# Patient Record
Sex: Female | Born: 1965 | Race: White | Hispanic: No | Marital: Single | Smoking: Never smoker
Health system: Southern US, Community
[De-identification: ages and names within clinical notes are randomized; demographics above are authoritative.]

## PROBLEM LIST (undated history)

## (undated) DIAGNOSIS — G809 Cerebral palsy, unspecified: Secondary | ICD-10-CM

## (undated) DIAGNOSIS — F79 Unspecified intellectual disabilities: Secondary | ICD-10-CM

## (undated) DIAGNOSIS — M419 Scoliosis, unspecified: Secondary | ICD-10-CM

## (undated) DIAGNOSIS — G40909 Epilepsy, unspecified, not intractable, without status epilepticus: Secondary | ICD-10-CM

## (undated) DIAGNOSIS — L709 Acne, unspecified: Secondary | ICD-10-CM

## (undated) DIAGNOSIS — N200 Calculus of kidney: Secondary | ICD-10-CM

## (undated) DIAGNOSIS — H5210 Myopia, unspecified eye: Secondary | ICD-10-CM

## (undated) HISTORY — DX: Scoliosis, unspecified: M41.9

## (undated) HISTORY — DX: Unspecified intellectual disabilities: F79

## (undated) HISTORY — DX: Acne, unspecified: L70.9

## (undated) HISTORY — DX: Epilepsy, unspecified, not intractable, without status epilepticus: G40.909

## (undated) HISTORY — DX: Calculus of kidney: N20.0

## (undated) HISTORY — DX: Cerebral palsy, unspecified: G80.9

## (undated) HISTORY — DX: Myopia, unspecified eye: H52.10

## (undated) HISTORY — PX: REVISION TOTAL KNEE ARTHROPLASTY: SHX767

## (undated) HISTORY — PX: PARTIAL HIP ARTHROPLASTY: SHX733

---

## 2004-09-05 ENCOUNTER — Emergency Department: Payer: Self-pay | Admitting: Emergency Medicine

## 2009-08-19 ENCOUNTER — Emergency Department: Payer: Self-pay | Admitting: Emergency Medicine

## 2010-07-09 ENCOUNTER — Ambulatory Visit: Payer: Self-pay | Admitting: Otolaryngology

## 2011-03-30 ENCOUNTER — Emergency Department: Payer: Self-pay | Admitting: Emergency Medicine

## 2011-03-30 LAB — BASIC METABOLIC PANEL
Anion Gap: 12 (ref 7–16)
BUN: 14 mg/dL (ref 7–18)
Calcium, Total: 8.5 mg/dL (ref 8.5–10.1)
Chloride: 104 mmol/L (ref 98–107)
Co2: 28 mmol/L (ref 21–32)
Glucose: 91 mg/dL (ref 65–99)
Osmolality: 287 (ref 275–301)

## 2011-03-30 LAB — CBC
HCT: 39.7 % (ref 35.0–47.0)
HGB: 13.4 g/dL (ref 12.0–16.0)
MCHC: 33.7 g/dL (ref 32.0–36.0)
MCV: 97 fL (ref 80–100)
RBC: 4.11 10*6/uL (ref 3.80–5.20)
RDW: 13.1 % (ref 11.5–14.5)
WBC: 7.2 10*3/uL (ref 3.6–11.0)

## 2011-03-30 LAB — ACETAMINOPHEN LEVEL: Acetaminophen: <2 ug/mL

## 2011-03-30 LAB — VALPROIC ACID LEVEL: Valproic Acid: 86 ug/mL

## 2015-02-15 ENCOUNTER — Other Ambulatory Visit: Payer: Self-pay | Admitting: Internal Medicine

## 2015-02-15 DIAGNOSIS — R7989 Other specified abnormal findings of blood chemistry: Secondary | ICD-10-CM

## 2015-02-15 DIAGNOSIS — R945 Abnormal results of liver function studies: Secondary | ICD-10-CM

## 2015-02-20 ENCOUNTER — Ambulatory Visit
Admission: RE | Admit: 2015-02-20 | Discharge: 2015-02-20 | Disposition: A | Discharge status: Home / Self Care | Payer: Medicare Other | Source: Ambulatory Visit | Attending: Internal Medicine | Admitting: Internal Medicine

## 2015-02-20 DIAGNOSIS — J90 Pleural effusion, not elsewhere classified: Secondary | ICD-10-CM | POA: Diagnosis not present

## 2015-02-20 DIAGNOSIS — R945 Abnormal results of liver function studies: Secondary | ICD-10-CM

## 2015-02-20 DIAGNOSIS — R188 Other ascites: Secondary | ICD-10-CM | POA: Insufficient documentation

## 2015-02-20 DIAGNOSIS — R7989 Other specified abnormal findings of blood chemistry: Secondary | ICD-10-CM | POA: Insufficient documentation

## 2015-02-21 ENCOUNTER — Encounter: Payer: Self-pay | Admitting: Obstetrics and Gynecology

## 2015-02-21 ENCOUNTER — Ambulatory Visit (INDEPENDENT_AMBULATORY_CARE_PROVIDER_SITE_OTHER): Payer: Medicare Other | Admitting: Obstetrics and Gynecology

## 2015-02-21 VITALS — BP 109/69 | HR 125 | Wt 77.0 lb

## 2015-02-21 DIAGNOSIS — R7989 Other specified abnormal findings of blood chemistry: Secondary | ICD-10-CM | POA: Diagnosis not present

## 2015-02-21 DIAGNOSIS — G40909 Epilepsy, unspecified, not intractable, without status epilepticus: Secondary | ICD-10-CM | POA: Insufficient documentation

## 2015-02-21 DIAGNOSIS — R945 Abnormal results of liver function studies: Secondary | ICD-10-CM | POA: Insufficient documentation

## 2015-02-21 DIAGNOSIS — F79 Unspecified intellectual disabilities: Secondary | ICD-10-CM | POA: Diagnosis not present

## 2015-02-21 DIAGNOSIS — Z0000 Encounter for general adult medical examination without abnormal findings: Secondary | ICD-10-CM

## 2015-02-21 DIAGNOSIS — Z780 Asymptomatic menopausal state: Secondary | ICD-10-CM

## 2015-02-21 DIAGNOSIS — Z01419 Encounter for gynecological examination (general) (routine) without abnormal findings: Secondary | ICD-10-CM

## 2015-02-21 NOTE — Patient Instructions (Signed)
1.  Yearly mammogram is scheduled. 2.  Return in 1 year for gynecologic evaluation or as needed If vulvar/vaginal symptoms develop.

## 2015-02-21 NOTE — Progress Notes (Signed)
GYN ENCOUNTER NOTE  Subjective:       [name] is a 50 y.o. G0P0000 female is here for gynecologic evaluation of the following issues:  1.Annual GYN physical  The patient is a 50 year old white female with severe mental retardation and seizure disorder,On no hormonal medications, having no vaginal bleeding or discharge, presents from the [facility], Center for gynecologic evaluation. Patient has chronic incontinence for which she wears diapers.  No hygiene issues have been identified.  Regarding her perineum. Recent blood work done by Dr. Dareen Piano is notable for abnormal liver function tests; abdominal ultrasound has been completed (results unavailable at this time). 2 years ago.  The patient was started on aspirin because of her immobility..     Gynecologic History No LMP recorded. Patient is postmenopausal. Contraception: none Last mammogram:2015-BI-RADS 1  Obstetric History OB History  Gravida Para Term Preterm AB SAB TAB Ectopic Multiple Living         Past Medical History  Diagnosis Date  . Myopia   . Acne   . Cerebral palsy (HCC)   . Scoliosis   . Mental retardation   . Seizure disorder [facility])     Past Surgical History  Procedure Laterality Date  . Partial hip arthroplasty    . Revision total knee arthroplasty      Current Outpatient Prescriptions on File Prior to Visit  Medication Sig Dispense Refill  . aspirin 325 MG EC tablet Take 325 mg by mouth daily.    . benztropine (COGENTIN) 0.5 MG tablet Take 0.5 mg by mouth 2 (two) times daily.    . Control Gel Formula Dressing (DUODERM SIGNAL DRESSING EX) Apply topically.    Marland Kitchen doxycycline (VIBRA-TABS) 100 MG tablet Take 100 mg by mouth 2 (two) times daily.    . furosemide (LASIX) 20 MG tablet Take 20 mg by mouth.    . levothyroxine (SYNTHROID, LEVOTHROID) 100 MCG tablet Take 100 mcg by mouth daily before breakfast.    . Multiple Vitamins-Minerals (MULTIVITAMIN WITH MINERALS) tablet Take 1  tablet by mouth daily.    . Skin Protectants, Misc. (EUCERIN) cream Apply topically as needed for dry skin.    Marland Kitchen ziprasidone (GEODON) 80 MG capsule Take 80 mg by mouth 2 (two) times daily with a meal.     No current facility-administered medications on file prior to visit.    Allergies no known allergies  Social History   Social History  . Marital Status: Single    Spouse Name: N/A  . Number of Children: N/A  . Years of Education: N/A   Occupational History  . Not on file.   Social History Main Topics  . Smoking status: Never Smoker   . Smokeless tobacco: Not on file  . Alcohol Use: No  . Drug Use: No  . Sexual Activity: Not Currently   Other Topics Concern  . Not on file   Social History Narrative    Family History  Problem Relation Age of Onset  . Family history unknown: Yes    The following portions of the patient's history were reviewed and updated as appropriate: allergies, current medications, past family history, past medical history, past social history, past surgical history and problem list.  Review of Systems Unobtainable Chronic incontinence No abnormal uterine bleedingAbdomen  Objective:   BP 109/69 mmHg  Pulse 125  Wt 77 lb (34.927 kg) Menopausal white female lying on the exam table with assistance of providers;  fetal position. Head and neck: No adenopathy or thyromegaly. Lungs: Clear Heart: Regular rate and rhythm without murmur. Abdomen: Rigid, with voluntary guarding; nontender; no hepatomegaly; no hernia. Pelvic: External genitalia-no lesions, hyperemia, or epithelial skin breakdown. BUS-normal. Vagina-mild atrophy Cervix.  I did not evaluated. Uterus-not evaluated. Bimanual exam-not done. Rectovaginal-external hemorrhoids noted; internal exam not done. Extremities: Contractures.  Extremities noted; diffuse skin lesions with epithelial skin breakdown noted from chronic scratching.  Muscle atrophy is present  ASSESSMENT: 1.  Wellness  exam, gynecologic, stable. 2.  Seizure disorder. 3.  Mental retardation.  PLAN: 1.  Yearly mammogram. 2.  Monitor for any abnormal uterine bleeding or any gynecologic symptoms. 3.  Follow-up on a yearly basis or as needed.  Herold Harms, MD  Note: This dictation was prepared with Dragon dictation along with smaller phrase technology. Any transcriptional errors that result from this process are unintentional.

## 2016-01-22 ENCOUNTER — Emergency Department
Admission: EM | Admit: 2016-01-22 | Discharge: 2016-01-22 | Disposition: A | Discharge status: Home / Self Care | Payer: Medicare Other | Attending: Emergency Medicine | Admitting: Emergency Medicine

## 2016-01-22 ENCOUNTER — Encounter: Payer: Self-pay | Admitting: Emergency Medicine

## 2016-01-22 DIAGNOSIS — L089 Local infection of the skin and subcutaneous tissue, unspecified: Secondary | ICD-10-CM | POA: Insufficient documentation

## 2016-01-22 DIAGNOSIS — Z7982 Long term (current) use of aspirin: Secondary | ICD-10-CM | POA: Insufficient documentation

## 2016-01-22 DIAGNOSIS — Z79899 Other long term (current) drug therapy: Secondary | ICD-10-CM | POA: Diagnosis not present

## 2016-01-22 DIAGNOSIS — T148XXA Other injury of unspecified body region, initial encounter: Secondary | ICD-10-CM

## 2016-01-22 MED ORDER — AMOXICILLIN-POT CLAVULANATE 875-125 MG PO TABS: 1.0000 | ORAL_TABLET | Freq: Two times a day (BID) | ORAL | 0 refills | Status: AC

## 2016-01-22 NOTE — Discharge Instructions (Signed)
Please seek medical attention for any high fevers, chest pain, shortness of breath, change in behavior, persistent vomiting, bloody stool or any other new or concerning symptoms.  

## 2016-01-22 NOTE — ED Provider Notes (Signed)
[facility] Emergency Department Provider Note  _________________________________________   I have reviewed the triage vital signs and the nursing notes.   HISTORY  Chief Complaint Wound Infection   History limited by: MR, history obtained from caregiver   HPI [name] is a 51 y.o. female who presents to the emergency department today because of concerns for wound infection. Per caregiver the patient is a long history of biting her wrists. The patient has been doing this more often in the past couple weeks. The patient has cause infections in the past. They have not noticed any fevers. No nausea or vomiting. They have tried all manners of protecting the wrist which the patient does not tolerate and/or find some way to get around.   Past Medical History:  Diagnosis Date  . Acne   . Cerebral palsy (HCC)   . Mental retardation   . Myopia   . Scoliosis   . Seizure disorder [facility])     Patient Active Problem List   Diagnosis Date Noted  . Mental retardation 02/21/2015  . Seizure disorder, primary (HCC) 02/21/2015  . Menopause 02/21/2015  . Abnormal liver function tests 02/21/2015    Past Surgical History:  Procedure Laterality Date  . PARTIAL HIP ARTHROPLASTY    . REVISION TOTAL KNEE ARTHROPLASTY      Prior to Admission medications   Medication Sig Start Date End Date Taking? Authorizing Provider  aspirin 325 MG EC tablet Take 325 mg by mouth daily.    Historical Provider, MD  benztropine (COGENTIN) 0.5 MG tablet Take 0.5 mg by mouth 2 (two) times daily.    Historical Provider, MD  carbamazepine (TEGRETOL) 100 MG chewable tablet Chew by mouth 2 (two) times daily.    Historical Provider, MD  Control Gel Formula Dressing (DUODERM SIGNAL DRESSING EX) Apply topically.    Historical Provider, MD  doxycycline (VIBRA-TABS) 100 MG tablet Take 100 mg by mouth 2 (two) times daily.    Historical Provider, MD  furosemide (LASIX) 20 MG tablet Take 20 mg  by mouth.    Historical Provider, MD  levothyroxine (SYNTHROID, LEVOTHROID) 100 MCG tablet Take 100 mcg by mouth daily before breakfast.    Historical Provider, MD  Multiple Vitamins-Minerals (MULTIVITAMIN WITH MINERALS) tablet Take 1 tablet by mouth daily.    Historical Provider, MD  omeprazole (PRILOSEC) 20 MG capsule Take 20 mg by mouth daily.    Historical Provider, MD  Skin Protectants, Misc. (EUCERIN) cream Apply topically as needed for dry skin.    Historical Provider, MD  vitamin E 100 UNIT capsule Take by mouth daily.    Historical Provider, MD  ziprasidone (GEODON) 80 MG capsule Take 80 mg by mouth 2 (two) times daily with a meal.    Historical Provider, MD    Allergies Patient has no known allergies.  Family History  Problem Relation Age of Onset  . Family history unknown: Yes    Social History Social History  Substance Use Topics  . Smoking status: Never Smoker  . Smokeless tobacco: Never Used  . Alcohol use No    Review of Systems Unable to obtain given MR  ____________________________________________   PHYSICAL EXAM:  VITAL SIGNS: ED Triage Vitals  Enc Vitals Group     BP 01/22/16 1749 (!) 141/81     Pulse Rate 01/22/16 1749 (!) 103     Resp 01/22/16 1749 20     Temp 01/22/16 1749 98.7 F (37.1 C)     Temp Source  01/22/16 1749 Axillary     SpO2 01/22/16 1749 93 %     Weight 01/22/16 1750 80 lb (36.3 kg)     Height 01/22/16 1750 4\' 10"  (1.473 m)   Constitutional: Awake and alert. Non verbal. No acute distress.  Eyes: Conjunctivae are normal. Normal extraocular movements. ENT   Head: Normocephalic and atraumatic.   Nose: No congestion/rhinnorhea.   Mouth/Throat: Mucous membranes are moist.   Neck: No stridor. Hematological/Lymphatic/Immunilogical: No cervical lymphadenopathy. Cardiovascular: Normal rate, regular rhythm.  No murmurs, rubs, or gallops.  Respiratory: Normal respiratory effort without tachypnea nor retractions. Breath sounds  are clear and equal bilaterally. No wheezes/rales/rhonchi. Gastrointestinal: Soft and non tender. No rebound. No guarding.  Neurologic:  Non verbal. Noted to bite her wrists occasionally.  Skin:  Bilateral wounds to wrists. Some surrounding erythema. Mild swelling to dorsal surface of right wrist. No fluctuance to either wrist. ROM intact.   ____________________________________________    LABS (pertinent positives/negatives)  None  ____________________________________________   EKG  None  ____________________________________________    RADIOLOGY  None  ____________________________________________   PROCEDURES  Procedures  ____________________________________________   INITIAL IMPRESSION / ASSESSMENT AND PLAN / ED COURSE  Pertinent labs & imaging results that were available during my care of the patient were reviewed by me and considered in my medical decision making (see chart for details).  Patient brought to emergency department today by caregiver because of concerns for infected wounds to her bilateral wrists. These are self-inflicted from her own bites. I'll exam there is some erythema around these areas. I do think they're likely infected. No swelling or tenderness to the flexor surface. Will discharge with antibiotics. Did discuss return precautions.  ____________________________________________   FINAL CLINICAL IMPRESSION(S) / ED DIAGNOSES  Final diagnoses:  Wound infection     Note: This dictation was prepared with Dragon dictation. Any transcriptional errors that result from this process are unintentional     Phineas SemenGraydon Kourtland Coopman, MD 01/22/16 2150

## 2016-01-22 NOTE — ED Triage Notes (Signed)
Patient presents to the ED with redness and swelling to her right hand.  Patient is wheelchair bound and not very verbal at baseline.  Care taker reports that patient often bites her hands and wrists but today she noticed that area appeared swollen and red.  Patient does not appear in distress at this time.  Patient did attempt to bite registration staff.

## 2016-02-23 NOTE — Progress Notes (Signed)
GYN ENCOUNTER NOTE  Subjective:       [name] is a 51 y.o. G0P0000 female is here for gynecologic evaluation of the following issues:  1.Annual GYN physical 2. pcp tried prempro- insurance will not help  The patient is a 51 year old white female with severe mental retardation and seizure disorder,On no hormonal medications, having no vaginal bleeding or discharge, [facility]. Patient has chronic incontinence for which she wears diapers.  No hygiene issues have been identified.  Regarding her perineum. The patient was started on aspirin because of her immobility. Healthcare workers have noted the patient to be more emotionally labile recently; primary care did a trial of HRT; HRT was discontinued due to insurance non-authorization.     Gynecologic History No LMP recorded. Patient is postmenopausal. Contraception: none Last mammogram:2015-BI-RADS 1  Obstetric History OB History  Gravida Para Term Preterm AB Living  0 0 0 0 0 0  SAB TAB Ectopic Multiple Live Births  0 0 0 0          Past Medical History:  Diagnosis Date  . Acne   . Cerebral palsy (HCC)   . Mental retardation   . Myopia   . Scoliosis   . Seizure disorder [facility](HCC)     Past Surgical History:  Procedure Laterality Date  . PARTIAL HIP ARTHROPLASTY    . REVISION TOTAL KNEE ARTHROPLASTY      Current Outpatient Prescriptions on File Prior to Visit  Medication Sig Dispense Refill  . aspirin 325 MG EC tablet Take 325 mg by mouth daily.    . benztropine (COGENTIN) 0.5 MG tablet Take 0.5 mg by mouth 2 (two) times daily.    . carbamazepine (TEGRETOL) 100 MG chewable tablet Chew by mouth 2 (two) times daily.    . Control Gel Formula Dressing (DUODERM SIGNAL DRESSING EX) Apply topically.    Marland Kitchen. doxycycline (VIBRA-TABS) 100 MG tablet Take 100 mg by mouth 2 (two) times daily.    . furosemide (LASIX) 20 MG tablet Take 20 mg by mouth.    . levothyroxine  (SYNTHROID, LEVOTHROID) 100 MCG tablet Take 100 mcg by mouth daily before breakfast.    . Multiple Vitamins-Minerals (MULTIVITAMIN WITH MINERALS) tablet Take 1 tablet by mouth daily.    Marland Kitchen. omeprazole (PRILOSEC) 20 MG capsule Take 20 mg by mouth daily.    . Skin Protectants, Misc. (EUCERIN) cream Apply topically as needed for dry skin.    Marland Kitchen. vitamin E 100 UNIT capsule Take by mouth daily.    . ziprasidone (GEODON) 80 MG capsule Take 80 mg by mouth 2 (two) times daily with a meal.     No current facility-administered medications on file prior to visit.     No Known Allergies  Social History   Social History  . Marital status: Single    Spouse name: N/A  . Number of children: N/A  . Years of education: N/A   Occupational History  . Not on file.   Social History Main Topics  . Smoking status: Never Smoker  . Smokeless tobacco: Never Used  . Alcohol use No  . Drug use: No  . Sexual activity: Not Currently   Other Topics Concern  . Not on file   Social History Narrative  . No narrative on file    Family History  Problem Relation Age of Onset  . Family history unknown: Yes    The following portions of the patient's history were reviewed  and updated as appropriate: allergies, current medications, past family history, past medical history, past social history, past surgical history and problem list.  Review of Systems Unobtainable Chronic incontinence No abnormal uterine bleedingAbdomen  Objective:   BP 135/80   Pulse 69   Ht 4\' 10"  (1.473 m)   Wt 77 lb (34.9 kg)   BMI 16.09 kg/m  Menopausal white female lying on the exam table with assistance of providers; fetal position. Head and neck: No adenopathy or thyromegaly. Lungs: Clear Heart: Regular rate and rhythm without murmur. Abdomen: Rigid, with voluntary guarding; nontender; no hepatomegaly; no hernia.Bowel sounds present Pelvic: External genitalia-no lesions, hyperemia, or epithelial skin  breakdown. BUS-normal. Vagina-mild atrophy Cervix: Nulliparous; no lesions; Friable with Pap smear Uterus-Nonenlarged Bimanual palpable masses Rectovaginal-external hemorrhoids noted; sphincter tone lax; soft stool in the rectal vault without palpable rectal masses Extremities: Contractures.  Extremities noted; diffuse skin lesions with epithelial skin breakdown noted from chronic scratching.  Muscle atrophy is present  ASSESSMENT: 1.  Wellness exam, gynecologic, stable. 2.  Seizure disorder. 3.  Mental retardation.  PLAN: 1.  Yearly mammogram. 2. Pap smear is obtained 3.  Monitor for any abnormal uterine bleeding or any gynecologic symptoms. 4. Do not recommend HRT therapy in this patient due to potential increased risk for DVT in sedentary patient.  5. Follow-up on a yearly basis or as needed.  Darol Destine, CMA  Herold Harms, MD   Note: This dictation was prepared with Dragon dictation along with smaller phrase technology. Any transcriptional errors that result from this process are unintentional.

## 2016-02-27 ENCOUNTER — Encounter: Payer: Self-pay | Admitting: Obstetrics and Gynecology

## 2016-02-27 ENCOUNTER — Ambulatory Visit (INDEPENDENT_AMBULATORY_CARE_PROVIDER_SITE_OTHER): Payer: Medicare Other | Admitting: Obstetrics and Gynecology

## 2016-02-27 VITALS — BP 135/80 | HR 69 | Ht <= 58 in | Wt 77.0 lb

## 2016-02-27 DIAGNOSIS — G40909 Epilepsy, unspecified, not intractable, without status epilepticus: Secondary | ICD-10-CM

## 2016-02-27 DIAGNOSIS — Z780 Asymptomatic menopausal state: Secondary | ICD-10-CM | POA: Diagnosis not present

## 2016-02-27 DIAGNOSIS — F79 Unspecified intellectual disabilities: Secondary | ICD-10-CM | POA: Diagnosis not present

## 2016-02-27 DIAGNOSIS — Z0000 Encounter for general adult medical examination without abnormal findings: Secondary | ICD-10-CM

## 2016-02-27 DIAGNOSIS — Z01419 Encounter for gynecological examination (general) (routine) without abnormal findings: Secondary | ICD-10-CM

## 2016-02-27 NOTE — Patient Instructions (Signed)
1. Pap smear is obtained 2. Do not recommend HRT therapy 3. Screening mammogram is ordered 4. Return in 1 year for follow-up or as needed if gynecologic issues develop

## 2016-02-27 NOTE — Addendum Note (Signed)
Addended by: Marchelle FolksMILLER, Townsend Cudworth G on: 02/27/2016 04:02 PM   Modules accepted: Orders

## 2016-02-29 LAB — PAP IG AND HPV HIGH-RISK
HPV, high-risk: NEGATIVE
PAP Smear Comment: 0

## 2016-09-18 ENCOUNTER — Encounter: Payer: Medicare Other | Attending: Internal Medicine | Admitting: Internal Medicine

## 2016-09-18 DIAGNOSIS — G809 Cerebral palsy, unspecified: Secondary | ICD-10-CM | POA: Insufficient documentation

## 2016-09-18 DIAGNOSIS — G4730 Sleep apnea, unspecified: Secondary | ICD-10-CM | POA: Insufficient documentation

## 2016-09-18 DIAGNOSIS — L03113 Cellulitis of right upper limb: Secondary | ICD-10-CM | POA: Diagnosis not present

## 2016-09-18 DIAGNOSIS — X58XXXD Exposure to other specified factors, subsequent encounter: Secondary | ICD-10-CM | POA: Insufficient documentation

## 2016-09-18 DIAGNOSIS — S60571D Other superficial bite of hand of right hand, subsequent encounter: Secondary | ICD-10-CM | POA: Diagnosis not present

## 2016-09-18 DIAGNOSIS — L03114 Cellulitis of left upper limb: Secondary | ICD-10-CM | POA: Insufficient documentation

## 2016-09-18 DIAGNOSIS — L98498 Non-pressure chronic ulcer of skin of other sites with other specified severity: Secondary | ICD-10-CM | POA: Insufficient documentation

## 2016-09-18 DIAGNOSIS — S60572D Other superficial bite of hand of left hand, subsequent encounter: Secondary | ICD-10-CM | POA: Diagnosis not present

## 2016-09-18 DIAGNOSIS — F72 Severe intellectual disabilities: Secondary | ICD-10-CM | POA: Diagnosis not present

## 2016-09-19 ENCOUNTER — Ambulatory Visit: Payer: Medicare Other | Admitting: Surgery

## 2016-09-20 NOTE — Progress Notes (Signed)
AUDYN, DIMERCURIO ([id number]) Visit Report for 09/18/2016 Abuse/Suicide Risk Screen Details Patient Name: [name], [name]. Date of Service: 09/18/2016 12:45 PM Medical Record Number: [id number] Patient Account Number: [id number] Date of Birth/Sex: 1965-11-14 (50 y.o. Female) Treating RN: Huel Coventry Primary Care Ismelda Weatherman: Einar Crow Other Clinician: Referring Omarius Grantham: Debbra Riding Treating Jariya Reichow/Extender: Rudene Re in Treatment: 0 Abuse/Suicide Risk Screen Items Answer Notes Patient has mental retardation and is unable to answer. Electronic Signature(s) Signed: 09/18/2016 5:49:32 PM By: Elliot Gurney, BSN, RN, CWS, Kim RN, BSN Entered By: Elliot Gurney, BSN, RN, CWS, Kim on 09/18/2016 13:13:04 [name] ([id number]) -------------------------------------------------------------------------------- Activities of Daily Living Details Patient Name: LARCENIA, HOLADAY. Date of Service: 09/18/2016 12:45 PM Medical Record Number: [id number] Patient Account Number: [id number] Date of Birth/Sex: [birth year] (50 y.o. Female) Treating RN: Huel Coventry Primary Care Malacki Mcphearson: Einar Crow Other Clinician: Referring Walta Bellville: Debbra Riding Treating Eathel Pajak/Extender: Rudene Re in Treatment: 0 Activities of Daily Living Items Answer Activities of Daily Living (Please select one for each item) Drive Automobile Not Able Take Medications Not Able Use Telephone Not Able Care for Appearance Not Able Use Toilet Not Able Mady Haagensen / Shower Not Able Dress Self Not Able Feed Self Not Able Walk Not Able Get In / Out Bed Not Able Housework Not Able Prepare Meals Not Able Handle Money Not Able Shop for Self Not Able Electronic Signature(s) Signed: 09/18/2016 5:49:32 PM By: Elliot Gurney, BSN, RN, CWS, Kim RN, BSN Entered By: Elliot Gurney, BSN, RN, CWS, Kim on 09/18/2016 13:13:17 [name]  ([id number]) -------------------------------------------------------------------------------- Education Assessment Details Patient Name: [name]. Date of Service: 09/18/2016 12:45 PM Medical Record Number: [id number] Patient Account Number: [id number] Date of Birth/Sex: 1965/07/30 (50 y.o. Female) Treating RN: Huel Coventry Primary Care Samina Weekes: Einar Crow Other Clinician: Referring Renny Remer: Debbra Riding Treating Kadar Chance/Extender: Rudene Re in Treatment: 0 Primary Learner Assessed: Caregiver day manager Patient has mental Reason Patient is not Primary Learner: retardation and is unable to answer. Learning Preferences/Education Level/Primary Language Learning Preference: Explanation, Demonstration Preferred Language: English Cognitive Barrier Assessment/Beliefs Language Barrier: No Memory Deficit: No Emotional Barrier: No Cultural/Religious Beliefs Affecting Medical No Care: Physical Barrier Assessment Impaired Vision: No Impaired Hearing: No Decreased Hand dexterity: No Knowledge/Comprehension Assessment Knowledge Level: Low Comprehension Level: Low Ability to understand written Low instructions: Motivation Assessment Anxiety Level: Anxious Cooperation: Cooperative Interest in Health Problems: Uninterested Notes Patient has mental retardation and is unable to answer. Electronic Signature(s) Signed: 09/18/2016 5:49:32 PM By: Elliot Gurney, BSN, RN, CWS, Kim RN, BSN Entered By: Elliot Gurney, BSN, RN, CWS, Kim on 09/18/2016 13:14:03 SHARINE, CADLE ([id number]) -------------------------------------------------------------------------------- Fall Risk Assessment Details Patient Name: [name]. Date of Service: 09/18/2016 12:45 PM Medical Record Number: [id number] Patient Account Number: [id number] Date of Birth/Sex: 02-21-1965 (50 y.o. Female) Treating RN: Huel Coventry Primary Care Bowie Doiron: Einar Crow Other Clinician: Referring Takeela Peil:  Debbra Riding Treating Lysha Schrade/Extender: Rudene Re in Treatment: 0 Fall Risk Assessment Items Have you had 2 or more falls in the last 12 monthso 0 No Have you had any fall that resulted in injury in the last 12 monthso 0 No FALL RISK ASSESSMENT: History of falling - immediate or within 3 months 25 Yes Secondary diagnosis 0 No Ambulatory aid None/bed rest/wheelchair/nurse 0 Yes Crutches/cane/walker 0 No Furniture 0 No IV Access/Saline Lock 0 No Gait/Training Normal/bed rest/immobile 0 Yes Weak 0 No Impaired 0 No Mental Status Oriented to own ability 0 No Notes Patient has mental retardation and is nonverbal. Electronic Signature(s) Signed: 09/18/2016 5:49:32  PM By: Elliot GurneyWoody, BSN, RN, CWS, Kim RN, BSN Entered By: Elliot GurneyWoody, BSN, RN, CWS, Kim on 09/18/2016 13:14:36 Danae OrleansBYRUM, Romilda K. ([id number]) -------------------------------------------------------------------------------- Foot Assessment Details Patient Name: Danae OrleansBYRUM, Anaih K. Date of Service: 09/18/2016 12:45 PM Medical Record Number: [id number] Patient Account Number: [id number] Date of Birth/Sex: 10-26-1965 (50 y.o. Female) Treating RN: Huel CoventryWoody, Kim Primary Care Evelynne Spiers: Einar CrowAnderson, Marshall Other Clinician: Referring Abran Gavigan: Debbra RidingWHITAKER, JASON Treating Kalyani Maeda/Extender: Rudene ReBritto, Errol Weeks in Treatment: 0 Foot Assessment Items [x]  Unable to perform due to altered mental status Site Locations + = Sensation present, - = Sensation absent, C = Callus, U = Ulcer R = Redness, W = Warmth, M = Maceration, PU = Pre-ulcerative lesion F = Fissure, S = Swelling, D = Dryness Assessment Right: Left: Other Deformity: No No Prior Foot Ulcer: No No Prior Amputation: No No Charcot Joint: No No Ambulatory Status: Gait: Electronic Signature(s) Signed: 09/18/2016 5:49:32 PM By: Elliot GurneyWoody, BSN, RN, CWS, Kim RN, BSN Entered By: Elliot GurneyWoody, BSN, RN, CWS, Kim on 09/18/2016 13:15:06 Danae OrleansBYRUM, Chastity K.  ([id number]) -------------------------------------------------------------------------------- Nutrition Risk Assessment Details Patient Name: Danae OrleansBYRUM, Brianni K. Date of Service: 09/18/2016 12:45 PM Medical Record Number: [id number] Patient Account Number: [id number] Date of Birth/Sex: 10-26-1965 (50 y.o. Female) Treating RN: Huel CoventryWoody, Kim Primary Care Quentez Lober: Einar CrowAnderson, Marshall Other Clinician: Referring Oumou Smead: Debbra RidingWHITAKER, JASON Treating Shaquanda Graves/Extender: Rudene ReBritto, Errol Weeks in Treatment: 0 Height (in): Weight (lbs): Body Mass Index (BMI): Nutrition Risk Assessment Items NUTRITION RISK SCREEN: I have an illness or condition that made me change the kind and/or 0 No amount of food I eat I eat fewer than two meals per day 0 No I eat few fruits and vegetables, or milk products 0 No I have three or more drinks of beer, liquor or wine almost every day 0 No I have tooth or mouth problems that make it hard for me to eat 0 No I don't always have enough money to buy the food I need 0 No I eat alone most of the time 0 No I take three or more different prescribed or over-the-counter drugs a 1 Yes day Without wanting to, I have lost or gained 10 pounds in the last six 0 No months I am not always physically able to shop, cook and/or feed myself 0 No Nutrition Protocols Good Risk Protocol 0 No interventions needed Moderate Risk Protocol Electronic Signature(s) Signed: 09/18/2016 5:49:32 PM By: Elliot GurneyWoody, BSN, RN, CWS, Kim RN, BSN Entered By: Elliot GurneyWoody, BSN, RN, CWS, Kim on 09/18/2016 13:14:53

## 2016-09-20 NOTE — Progress Notes (Signed)
[name], [name] ([id number]) Visit Report for 09/18/2016 Allergy List Details Patient Name: [name], [name]. Date of Service: 09/18/2016 12:45 PM Medical Record Number: [id number] Patient Account Number: [id number] Date of Birth/Sex: 12/01/1965 (51 y.o. Female) Treating RN: Huel Coventry Primary Care Edona Schreffler: Einar Crow Other Clinician: Referring Conard Alvira: Debbra Riding Treating Elara Cocke/Extender: Rudene Re in Treatment: 0 Allergies Active Allergies No Known Drug Allergies Allergy Notes Electronic Signature(s) Signed: 09/18/2016 5:49:32 PM By: Elliot Gurney, BSN, RN, CWS, Kim RN, BSN Entered By: Elliot Gurney, BSN, RN, CWS, Kim on 09/18/2016 12:59:38 [name] ([id number]) -------------------------------------------------------------------------------- Arrival Information Details Patient Name: [name]. Date of Service: 09/18/2016 12:45 PM Medical Record Number: [id number] Patient Account Number: [id number] Date of Birth/Sex: 1965-10-22 (51 y.o. Female) Treating RN: Huel Coventry Primary Care Antoino Westhoff: Einar Crow Other Clinician: Referring Windsor Goeken: Debbra Riding Treating Lleyton Byers/Extender: Rudene Re in Treatment: 0 Visit Information Patient Arrived: Wheel Chair Arrival Time: 12:55 Accompanied By: Neldon Labella, day manager Transfer Assistance: None Patient Identification Verified: Yes Secondary Verification Yes Process Completed: Patient Has Alerts: Yes Notes Left patient in her chair. Electronic Signature(s) Signed: 09/18/2016 5:49:32 PM By: Elliot Gurney, BSN, RN, CWS, Kim RN, BSN Entered By: Elliot Gurney, BSN, RN, CWS, Kim on 09/18/2016 12:55:55 [name] ([id number]) -------------------------------------------------------------------------------- Clinic Level of Care Assessment Details Patient Name: [name], [name]. Date of Service: 09/18/2016 12:45 PM Medical Record Patient Account Number: [id number] Number: Treating  RN: Huel Coventry 11-02-1965 (51 y.o. Other Clinician: Date of Birth/Sex: Female) Treating ROBSON, MICHAEL Primary Care Morenike Cuff: Einar Crow Zay Yeargan/Extender: G Referring Dakayla Disanti: Driscilla Grammes in Treatment: 0 Clinic Level of Care Assessment Items TOOL 2 Quantity Score []  - Use when only an EandM is performed on the INITIAL visit 0 ASSESSMENTS - Nursing Assessment / Reassessment X - General Physical Exam (combine w/ comprehensive assessment (listed just 1 20 below) when performed on new pt. evals) X - Comprehensive Assessment (HX, ROS, Risk Assessments, Wounds Hx, etc.) 1 25 ASSESSMENTS - Wound and Skin Assessment / Reassessment []  - Simple Wound Assessment / Reassessment - one wound 0 X - Complex Wound Assessment / Reassessment - multiple wounds 1 5 []  - Dermatologic / Skin Assessment (not related to wound area) 0 ASSESSMENTS - Ostomy and/or Continence Assessment and Care []  - Incontinence Assessment and Management 0 []  - Ostomy Care Assessment and Management (repouching, etc.) 0 PROCESS - Coordination of Care []  - Simple Patient / Family Education for ongoing care 0 X - Complex (extensive) Patient / Family Education for ongoing care 1 20 X - Staff obtains Chiropractor, Records, Test Results / Process Orders 1 10 []  - Staff telephones HHA, Nursing Homes / Clarify orders / etc 0 []  - Routine Transfer to another Facility (non-emergent condition) 0 []  - Routine Hospital Admission (non-emergent condition) 0 X - New Admissions / Manufacturing engineer / Ordering NPWT, Apligraf, etc. 1 15 []  - Emergency Hospital Admission (emergent condition) 0 [name], [name] ([id number]) X - Simple Discharge Coordination 1 10 []  - Complex (extensive) Discharge Coordination 0 PROCESS - Special Needs []  - Pediatric / Minor Patient Management 0 []  - Isolation Patient Management 0 []  - Hearing / Language / Visual special needs 0 []  - Assessment of Community assistance (transportation,  D/C planning, etc.) 0 []  - Additional assistance / Altered mentation 0 []  - Support Surface(s) Assessment (bed, cushion, seat, etc.) 0 INTERVENTIONS - Wound Cleansing / Measurement X - Wound Imaging (photographs - any number of wounds) 1 5 []  - Wound Tracing (instead of photographs)  0 []  - Simple Wound Measurement - one wound 0 X - Complex Wound Measurement - multiple wounds 3 5 []  - Simple Wound Cleansing - one wound 0 X - Complex Wound Cleansing - multiple wounds 3 5 INTERVENTIONS - Wound Dressings []  - Small Wound Dressing one or multiple wounds 0 X - Medium Wound Dressing one or multiple wounds 2 15 []  - Large Wound Dressing one or multiple wounds 0 []  - Application of Medications - injection 0 INTERVENTIONS - Miscellaneous []  - External ear exam 0 []  - Specimen Collection (cultures, biopsies, blood, body fluids, etc.) 0 []  - Specimen(s) / Culture(s) sent or taken to Lab for analysis 0 []  - Patient Transfer (multiple staff / Nurse, adult / Similar devices) 0 []  - Simple Staple / Suture removal (25 or less) 0 [name], [name] ([id number]) []  - Complex Staple / Suture removal (26 or more) 0 []  - Hypo / Hyperglycemic Management (close monitor of Blood Glucose) 0 []  - Ankle / Brachial Index (ABI) - do not check if billed separately 0 Has the patient been seen at the hospital within the last three years: Yes Total Score: 170 Level Of Care: New/Established - Level 5 Electronic Signature(s) Signed: 09/18/2016 5:49:32 PM By: Elliot Gurney, BSN, RN, CWS, Kim RN, BSN Entered By: Elliot Gurney, BSN, RN, CWS, Kim on 09/18/2016 13:49:10 [name] ([id number]) -------------------------------------------------------------------------------- Encounter Discharge Information Details Patient Name: [name]. Date of Service: 09/18/2016 12:45 PM Medical Record Patient Account Number: [id number] Number: Treating RN: Huel Coventry Oct 15, 1965 (51 y.o. Other Clinician: Date of  Birth/Sex: Female) Treating ROBSON, MICHAEL Primary Care Sumaiya Arruda: Einar Crow Emiya Loomer/Extender: G Referring Murline Weigel: Driscilla Grammes in Treatment: 0 Encounter Discharge Information Items Discharge Condition: Stable Ambulatory Status: Wheelchair Discharge Destination: Nursing Home Transportation: Private Auto Accompanied By: caregiver Schedule Follow-up Appointment: Yes Medication Reconciliation completed and provided to Patient/Care Yes Fedrick Cefalu: Provided on Clinical Summary of Care: 09/18/2016 Form Type Recipient Paper Patient MB Electronic Signature(s) Signed: 09/19/2016 10:47:24 AM By: Gwenlyn Perking Entered By: Gwenlyn Perking on 09/18/2016 13:52:29 [name] ([id number]) -------------------------------------------------------------------------------- Lower Extremity Assessment Details Patient Name: [name]. Date of Service: 09/18/2016 12:45 PM Medical Record Number: [id number] Patient Account Number: [id number] Date of Birth/Sex: Jul 16, 1965 (51 y.o. Female) Treating RN: Huel Coventry Primary Care Nehal Shives: Einar Crow Other Clinician: Referring Keir Foland: Debbra Riding Treating Hosie Sharman/Extender: Rudene Re in Treatment: 0 Electronic Signature(s) Signed: 09/18/2016 5:49:32 PM By: Elliot Gurney, BSN, RN, CWS, Kim RN, BSN Entered By: Elliot Gurney, BSN, RN, CWS, Kim on 09/18/2016 13:15:20 [name] ([id number]) -------------------------------------------------------------------------------- Multi Wound Chart Details Patient Name: [name]. Date of Service: 09/18/2016 12:45 PM Medical Record Patient Account Number: [id number] Number: Treating RN: Huel Coventry Mar 20, 1965 (50 y.o. Other Clinician: Date of Birth/Sex: Female) Treating ROBSON, MICHAEL Primary Care Alyxandria Wentz: Einar Crow Charleton Deyoung/Extender: G Referring Finneus Kaneshiro: Driscilla Grammes in Treatment: 0 Vital Signs Height(in): Pulse(bpm):  106 Weight(lbs): Blood Pressure 118/76 (mmHg): Body Mass Index(BMI): Temperature(F): 97.6 Respiratory Rate 16 (breaths/min): Photos: [1:No Photos] [2:No Photos] [3:No Photos] Wound Location: [1:Right Hand - Web Between 1st and 2nd Digit] [2:Left Hand - Web Between 1st and 2nd Digit] [3:Left Hand - Dorsum] Wounding Event: [1:Trauma] [2:Trauma] [3:Trauma] Primary Etiology: [1:Trauma, Other] [2:Trauma, Other] [3:Trauma, Other] Comorbid History: [1:Sleep Apnea, Quadriplegia] [2:Sleep Apnea, Quadriplegia] [3:Sleep Apnea, Quadriplegia] Date Acquired: [1:08/26/2016] [2:08/26/2016] [3:08/26/2016] Weeks of Treatment: [1:0] [2:0] [3:0] Wound Status: [1:Open] [2:Open] [3:Open] Measurements L x W x D 2x0.8x0.1 [2:1.5x1x0.1] [3:2.2x0.5x0.1] (cm) Area (cm) : [1:1.257] [2:1.178] [3:0.864] Volume (  cm) : [1:0.126] [2:0.118] [3:0.086] % Reduction in Area: [1:0.00%] [2:N/A] [3:N/A] % Reduction in Volume: 0.00% [2:N/A] [3:N/A] Classification: [1:Partial Thickness] [2:Partial Thickness] [3:Partial Thickness] Exudate Amount: [1:N/A] [2:N/A] [3:None Present] Wound Margin: [1:Flat and Intact] [2:Flat and Intact] [3:Flat and Intact] Exposed Structures: [1:Fascia: No Fat Layer (Subcutaneous Tissue) Exposed: No Tendon: No Muscle: No Joint: No Bone: No] [2:N/A] [3:N/A] Epithelialization: [1:None] [2:N/A] [3:None] Periwound Skin Texture: No Abnormalities Noted [2:No Abnormalities Noted] [3:No Abnormalities Noted] Periwound Skin No Abnormalities Noted No Abnormalities Noted No Abnormalities Noted Moisture: Periwound Skin Color: No Abnormalities Noted No Abnormalities Noted No Abnormalities Noted Tenderness on No No No Palpation: Assessment Notes: wounds are hard to N/A N/A assess. patient is easily agitated. Treatment Notes Electronic Signature(s) Signed: 09/18/2016 5:01:41 PM By: Baltazar Najjarobson, Michael MD Entered By: Baltazar Najjarobson, Michael on 09/18/2016 13:41:52 [name], [name]  ([id number]) -------------------------------------------------------------------------------- Pain Assessment Details Patient Name: [name], [name] Date of Service: 09/18/2016 12:45 PM Medical Record Number: [id number] Patient Account Number: [id number] Date of Birth/Sex: 1965-11-21 (51 y.o. Female) Treating RN: Huel CoventryWoody, Kim Primary Care Nikai Quest: Einar CrowAnderson, Marshall Other Clinician: Referring Malachai Schalk: Debbra RidingWHITAKER, JASON Treating Johnpatrick Jenny/Extender: Rudene ReBritto, Errol Weeks in Treatment: 0 Active Problems Location of Pain Severity and Description of Pain Patient Has Paino Patient Unable to Respond Site Locations Pain Management and Medication Current Pain Management: Goals for Pain Management Topical or injectable lidocaine is offered to patient for acute pain when surgical debridement is performed. If needed, Patient is instructed to use over the counter pain medication for the following 24-48 hours after debridement. Wound care MDs do not prescribed pain medications. Patient has chronic pain or uncontrolled pain. Patient has been instructed to make an appointment with their Primary Care Physician for pain management. Electronic Signature(s) Signed: 09/18/2016 5:49:32 PM By: Elliot GurneyWoody, BSN, RN, CWS, Kim RN, BSN Entered By: Elliot GurneyWoody, BSN, RN, CWS, Kim on 09/18/2016 12:56:13 [name], Charmian K. ([id number]) -------------------------------------------------------------------------------- Patient/Caregiver Education Details Patient Name: [name], [name] Date of Service: 09/18/2016 12:45 PM Medical Record Patient Account Number: [id number] Number: Treating RN: Huel CoventryWoody, Kim 1965-11-21 (50 y.o. Other Clinician: Date of Birth/Gender: Female) Treating ROBSON, MICHAEL Primary Care Physician: Einar CrowAnderson, Marshall Physician/Extender: G Referring Physician: Driscilla GrammesWHITAKER, JASON Weeks in Treatment: 0 Education Assessment Education Provided To: Caregiver Education Topics Provided Wound/Skin  Impairment: Handouts: Caring for Your Ulcer Methods: Demonstration Responses: State content correctly Electronic Signature(s) Signed: 09/18/2016 5:49:32 PM By: Elliot GurneyWoody, BSN, RN, CWS, Kim RN, BSN Entered By: Elliot GurneyWoody, BSN, RN, CWS, Kim on 09/18/2016 14:00:31 [name], [name] ([id number]) -------------------------------------------------------------------------------- Wound Assessment Details Patient Name: [name], [name] Date of Service: 09/18/2016 12:45 PM Medical Record Number: [id number] Patient Account Number: [id number] Date of Birth/Sex: 1965-11-21 (51 y.o. Female) Treating RN: Huel CoventryWoody, Kim Primary Care Helen Winterhalter: Einar CrowAnderson, Marshall Other Clinician: Referring Eran Mistry: Debbra RidingWHITAKER, JASON Treating Rachel Rison/Extender: Rudene ReBritto, Errol Weeks in Treatment: 0 Wound Status Wound Number: 1 Primary Etiology: Trauma, Other Wound Location: Right Hand - Web Between 1st Wound Status: Open and 2nd Digit Comorbid History: Sleep Apnea, Quadriplegia Wounding Event: Trauma Date Acquired: 08/26/2016 Weeks Of Treatment: 0 Clustered Wound: No Photos Photo Uploaded By: Elliot GurneyWoody, BSN, RN, CWS, Kim on 09/18/2016 13:58:59 Wound Measurements Length: (cm) 2 Width: (cm) 0.8 Depth: (cm) 0.1 Area: (cm) 1.257 Volume: (cm) 0.126 % Reduction in Area: 0% % Reduction in Volume: 0% Epithelialization: None Tunneling: No Undermining: No Wound Description Classification: Partial Thickness Wound Margin: Flat and Intact Wound Bed Exposed Structure Fascia Exposed: No Fat Layer (Subcutaneous Tissue) Exposed: No Tendon Exposed: No Muscle Exposed: No Joint Exposed: No Bone  Exposed: No Periwound Skin Texture Fye, Monia K. ([id number]) Texture Color No Abnormalities Noted: No No Abnormalities Noted: No Moisture No Abnormalities Noted: No Assessment Notes wounds are hard to assess. patient is easily agitated. Treatment Notes Wound #1 (Right Hand - Web Between 1st and 2nd Digit) 1. Cleansed with: Clean  wound with Normal Saline Notes Conform secured with tape applied to hands. Caregiver will have nurse apply Prisma Ag to hands once back home and gloves can be applied. Electronic Signature(s) Signed: 09/18/2016 5:49:32 PM By: Elliot Gurney, BSN, RN, CWS, Kim RN, BSN Entered By: Elliot Gurney, BSN, RN, CWS, Kim on 09/18/2016 13:20:34 [name] ([id number]) -------------------------------------------------------------------------------- Wound Assessment Details Patient Name: MERIDA, ALCANTAR. Date of Service: 09/18/2016 12:45 PM Medical Record Number: [id number] Patient Account Number: [id number] Date of Birth/Sex: 12-08-1965 (51 y.o. Female) Treating RN: Huel Coventry Primary Care Emberlynn Riggan: Einar Crow Other Clinician: Referring Salle Brandle: Debbra Riding Treating Dahlia Nifong/Extender: Rudene Re in Treatment: 0 Wound Status Wound Number: 2 Primary Etiology: Trauma, Other Wound Location: Left Hand - Web Between 1st and Wound Status: Open 2nd Digit Comorbid History: Sleep Apnea, Quadriplegia Wounding Event: Trauma Date Acquired: 08/26/2016 Weeks Of Treatment: 0 Clustered Wound: No Photos Photo Uploaded By: Elliot Gurney, BSN, RN, CWS, Kim on 09/18/2016 13:58:59 Wound Measurements Length: (cm) 1.5 % Reduction i Width: (cm) 1 % Reduction i Depth: (cm) 0.1 Area: (cm) 1.178 Volume: (cm) 0.118 n Area: n Volume: Wound Description Classification: Partial Thickness Wound Margin: Flat and Intact Periwound Skin Texture Texture Color No Abnormalities Noted: No No Abnormalities Noted: No Moisture No Abnormalities Noted: No Treatment Notes Wound #2 (Left Hand - Web Between 1st and 2nd Digit) 1. Cleansed with: JAZSMIN, COUSE. ([id number]) Clean wound with Normal Saline Notes Conform secured with tape applied to hands. Caregiver will have nurse apply Prisma Ag to hands once back home and gloves can be applied. Electronic Signature(s) Signed: 09/18/2016 5:49:32 PM By: Elliot Gurney, BSN, RN,  CWS, Kim RN, BSN Entered By: Elliot Gurney, BSN, RN, CWS, Kim on 09/18/2016 13:17:55 [name] ([id number]) -------------------------------------------------------------------------------- Wound Assessment Details Patient Name: FANCY, DUNKLEY. Date of Service: 09/18/2016 12:45 PM Medical Record Number: [id number] Patient Account Number: [id number] Date of Birth/Sex: 07-Jul-1965 (51 y.o. Female) Treating RN: Huel Coventry Primary Care Zael Shuman: Einar Crow Other Clinician: Referring Anarosa Kubisiak: Debbra Riding Treating Osborn Pullin/Extender: Rudene Re in Treatment: 0 Wound Status Wound Number: 3 Primary Etiology: Trauma, Other Wound Location: Left Hand - Dorsum Wound Status: Open Wounding Event: Trauma Comorbid History: Sleep Apnea, Quadriplegia Date Acquired: 08/26/2016 Weeks Of Treatment: 0 Clustered Wound: No Photos Photo Uploaded By: Elliot Gurney, BSN, RN, CWS, Kim on 09/18/2016 13:59:12 Wound Measurements Length: (cm) 2.2 Width: (cm) 0.5 Depth: (cm) 0.1 Area: (cm) 0.864 Volume: (cm) 0.086 % Reduction in Area: % Reduction in Volume: Epithelialization: None Wound Description Classification: Partial Thickness Wound Margin: Flat and Intact Exudate Amount: None Present Periwound Skin Texture Texture Color No Abnormalities Noted: No No Abnormalities Noted: No Moisture No Abnormalities Noted: No Treatment Notes Wound #3 (Left Hand - Dorsum) 1. Cleansed with: DARL, BRISBIN. ([id number]) Clean wound with Normal Saline Notes Conform secured with tape applied to hands. Caregiver will have nurse apply Prisma Ag to hands once back home and gloves can be applied. Electronic Signature(s) Signed: 09/18/2016 5:49:32 PM By: Elliot Gurney, BSN, RN, CWS, Kim RN, BSN Entered By: Elliot Gurney, BSN, RN, CWS, Kim on 09/18/2016 13:18:47 [name] ([id number]) -------------------------------------------------------------------------------- Vitals Details Patient Name: [name]. Date of Service: 09/18/2016 12:45 PM Medical Record  Number: [id number] Patient Account Number: [id number] Date of Birth/Sex: 30-Nov-1965 (51 y.o. Female) Treating RN: Huel Coventry Primary Care Nakota Elsen: Einar Crow Other Clinician: Referring Flecia Shutter: Debbra Riding Treating Shanee Batch/Extender: Rudene Re in Treatment: 0 Vital Signs Time Taken: 12:58 Temperature (F): 97.6 Unable to obtain height and weight: Medical Reason Pulse (bpm): 106 Respiratory Rate (breaths/min): 16 Blood Pressure (mmHg): 118/76 Reference Range: 80 - 120 mg / dl Electronic Signature(s) Signed: 09/18/2016 5:49:32 PM By: Elliot Gurney, BSN, RN, CWS, Kim RN, BSN Entered By: Elliot Gurney, BSN, RN, CWS, Kim on 09/18/2016 12:59:01

## 2016-09-21 NOTE — Progress Notes (Signed)
Carolyn, [name] ([id number]) Visit Report for 09/18/2016 Chief Complaint Document Details Patient Name: [name], [name]. Date of Service: 09/18/2016 12:45 PM Medical Record Patient Account Number: [id number] Number: Treating RN: Huel Coventry 11/08/1965 (51 y.o. Other Clinician: Date of Birth/Sex: Female) Treating Johnna Bollier Primary Care Provider: Einar Crow Provider/Extender: G Referring Provider: Driscilla Grammes in Treatment: 0 Information Obtained from: Patient Chief Complaint 09/18/16; patient is here for review of wounds on her bilateral hands which are presumed to be self-inflicted bite wounds Electronic Signature(s) Signed: 09/18/2016 5:01:41 PM By: Baltazar Najjar MD Entered By: Baltazar Najjar on 09/18/2016 13:42:59 Cordova, [name] ([id number]) -------------------------------------------------------------------------------- HPI Details Patient Name: [name]. Date of Service: 09/18/2016 12:45 PM Medical Record Patient Account Number: [id number] Number: Treating RN: Huel Coventry 1965-11-29 (51 y.o. Other Clinician: Date of Birth/Sex: Female) Treating Devanta Daniel Primary Care Provider: Einar Crow Provider/Extender: G Referring Provider: Driscilla Grammes in Treatment: 0 History of Present Illness HPI Description: 09/18/16; this is a 51 year old woman with severe mental retardation, cerebral palsy. She apparently has a long history of repetitive movements of her hands back and forth to her mouth predominantly involving the lateral aspect of the hands especially the inner phalangeal area between the thumb and first fingers bilaterally. She was recently put on Augmentin by primary care for concern about cellulitis. She lives at [facility]. She has a history of cerebral palsy, mental retardation, functional quadriplegia although she has reasonable use of the left greater than right arm. She also  has seizure disorder Electronic Signature(s) Signed: 09/18/2016 5:01:41 PM By: Baltazar Najjar MD Entered By: Baltazar Najjar on 09/18/2016 13:44:56 [name] ([id number]) -------------------------------------------------------------------------------- Physical Exam Details Patient Name: [name], [name]. Date of Service: 09/18/2016 12:45 PM Medical Record Patient Account Number: [id number] Number: Treating RN: Huel Coventry [birth year] (51 y.o. Other Clinician: Date of Birth/Sex: Female) Treating Abdulrahim Siddiqi Primary Care Provider: Einar Crow Provider/Extender: G Referring Provider: Debbra Riding Weeks in Treatment: 0 Constitutional Sitting or standing Blood Pressure is within target range for patient.. Pulse regular and within target range for patient.Marland Kitchen Respirations regular, non-labored and within target range.. Temperature is normal and within the target range for the patient.. Patient is restless and agitated with any attempt at touching her. Full physical exam is not possible. Eyes Conjunctivae clear. No discharge no scleral icterus. Gastrointestinal (GI) Soft nontender. No liver or spleen enlargement or tenderness.. Integumentary (Hair, Skin) Skin and subcutaneous tissue without rashes, lesions, discoloration or ulcers. No evidence of fungal disease. Hair and skin color texture normal and healthy in appearance.Marland Kitchen Psychiatric very restless and agitated. Notes Wound exam; there is in question are largely on the left greater than right hands. This involves the dorsal aspect of the left first metacarpophalangeal joint and the webspace of her left thumb and first finger. Also the webspace in the right first and second finger. Her intake nurse noted drainage on the right however there is no palpable tenderness over the thenar eminence bilaterally. No drainage could be expressed. It is difficult to be certain with her degree of agitation however I don't  think currently there is an active infection Electronic Signature(s) Signed: 09/18/2016 5:01:41 PM By: Baltazar Najjar MD Entered By: Baltazar Najjar on 09/18/2016 13:47:57 [name], [name] ([id number]) -------------------------------------------------------------------------------- Physician Orders Details Patient Name: [name]. Date of Service: 09/18/2016 12:45 PM Medical Record Number: [id number] Patient Account Number: [id number] Date of Birth/Sex: Aug 09, 1965 (51 y.o. Female) Treating RN: Huel Coventry Primary Care Provider:  Einar Crow Other Clinician: Referring Provider: Debbra Riding Treating Provider/Extender: Rudene Re in Treatment: 0 Verbal / Phone Orders: No Diagnosis Coding Wound Cleansing Wound #1 Right Hand - Web Between 1st and 2nd Digit o Clean wound with Normal Saline. o Cleanse wound with mild soap and water Wound #2 Left Hand - Web Between 1st and 2nd Digit o Clean wound with Normal Saline. o Cleanse wound with mild soap and water Wound #3 Left Hand - Dorsum o Clean wound with Normal Saline. o Cleanse wound with mild soap and water Primary Wound Dressing Wound #1 Right Hand - Web Between 1st and 2nd Digit o Prisma Ag - moisten with saline before applying to wounds Wound #2 Left Hand - Web Between 1st and 2nd Digit o Prisma Ag - moisten with saline before applying to wounds Wound #3 Left Hand - Dorsum o Prisma Ag - moisten with saline before applying to wounds Secondary Dressing Wound #1 Right Hand - Web Between 1st and 2nd Digit o Gauze and Kerlix/Conform o Other - Gloves on both hands at all times. Wound #2 Left Hand - Web Between 1st and 2nd Digit o Gauze and Kerlix/Conform o Other - Gloves on both hands at all times. Wound #3 Left Hand - Dorsum o Gauze and Kerlix/Conform o Other - Gloves on both hands at all times. Dressing Change Frequency KATERIA, CUTRONA. ([id number]) Wound #1 Right Hand -  Web Between 1st and 2nd Digit o Change dressing every other day. o Other: - More often as needed if soiled. Wound #2 Left Hand - Web Between 1st and 2nd Digit o Change dressing every other day. o Other: - More often as needed if soiled. Wound #3 Left Hand - Dorsum o Change dressing every other day. o Other: - More often as needed if soiled. Follow-up Appointments Wound #1 Right Hand - Web Between 1st and 2nd Digit o Return Appointment in 2 weeks. Wound #2 Left Hand - Web Between 1st and 2nd Digit o Return Appointment in 2 weeks. Wound #3 Left Hand - Dorsum o Return Appointment in 2 weeks. Home Health Wound #1 Right Hand - Web Between 1st and 2nd Digit o Initiate Home Health for Skilled Nursing - Per caregiver, Facility has RN that does dressing changes. o Home Health Nurse may visit PRN to address patientos wound care needs. o FACE TO FACE ENCOUNTER: MEDICARE and MEDICAID PATIENTS: I certify that this patient is under my care and that I had a face-to-face encounter that meets the physician face-to-face encounter requirements with this patient on this date. The encounter with the patient was in whole or in part for the following MEDICAL CONDITION: (primary reason for Home Healthcare) MEDICAL NECESSITY: I certify, that based on my findings, NURSING services are a medically necessary home health service. HOME BOUND STATUS: I certify that my clinical findings support that this patient is homebound (i.e., Due to illness or injury, pt requires aid of supportive devices such as crutches, cane, wheelchairs, walkers, the use of special transportation or the assistance of another person to leave their place of residence. There is a normal inability to leave the home and doing so requires considerable and taxing effort. Other absences are for medical reasons / religious services and are infrequent or of short duration when for other reasons). o If current dressing  causes regression in wound condition, may D/C ordered dressing product/s and apply Normal Saline Moist Dressing daily until next Wound Healing Center / Other MD appointment. Notify Wound  Healing Center of regression in wound condition at [phone]. o Please direct any NON-WOUND related issues/requests for orders to patient's Primary Care Physician Wound #2 Left Hand - Web Between 1st and 2nd Digit o Initiate Home Health for Skilled Nursing - Per caregiver, Facility has RN that does dressing changes. o Home Health Nurse may visit PRN to address patientos wound care needs. LOREDA, SILVERIO ([id number]) o FACE TO FACE ENCOUNTER: MEDICARE and MEDICAID PATIENTS: I certify that this patient is under my care and that I had a face-to-face encounter that meets the physician face-to-face encounter requirements with this patient on this date. The encounter with the patient was in whole or in part for the following MEDICAL CONDITION: (primary reason for Home Healthcare) MEDICAL NECESSITY: I certify, that based on my findings, NURSING services are a medically necessary home health service. HOME BOUND STATUS: I certify that my clinical findings support that this patient is homebound (i.e., Due to illness or injury, pt requires aid of supportive devices such as crutches, cane, wheelchairs, walkers, the use of special transportation or the assistance of another person to leave their place of residence. There is a normal inability to leave the home and doing so requires considerable and taxing effort. Other absences are for medical reasons / religious services and are infrequent or of short duration when for other reasons). o If current dressing causes regression in wound condition, may D/C ordered dressing product/s and apply Normal Saline Moist Dressing daily until next Wound Healing Center / Other MD appointment. Notify Wound Healing Center of regression in wound condition at  [phone]. o Please direct any NON-WOUND related issues/requests for orders to patient's Primary Care Physician Wound #3 Left Hand - Dorsum o Initiate Home Health for Skilled Nursing - Per caregiver, Facility has RN that does dressing changes. o Home Health Nurse may visit PRN to address patientos wound care needs. o FACE TO FACE ENCOUNTER: MEDICARE and MEDICAID PATIENTS: I certify that this patient is under my care and that I had a face-to-face encounter that meets the physician face-to-face encounter requirements with this patient on this date. The encounter with the patient was in whole or in part for the following MEDICAL CONDITION: (primary reason for Home Healthcare) MEDICAL NECESSITY: I certify, that based on my findings, NURSING services are a medically necessary home health service. HOME BOUND STATUS: I certify that my clinical findings support that this patient is homebound (i.e., Due to illness or injury, pt requires aid of supportive devices such as crutches, cane, wheelchairs, walkers, the use of special transportation or the assistance of another person to leave their place of residence. There is a normal inability to leave the home and doing so requires considerable and taxing effort. Other absences are for medical reasons / religious services and are infrequent or of short duration when for other reasons). o If current dressing causes regression in wound condition, may D/C ordered dressing product/s and apply Normal Saline Moist Dressing daily until next Wound Healing Center / Other MD appointment. Notify Wound Healing Center of regression in wound condition at [phone]. o Please direct any NON-WOUND related issues/requests for orders to patient's Primary Care Physician Electronic Signature(s) Signed: 09/18/2016 5:49:32 PM By: Elliot Gurney, BSN, RN, CWS, Kim RN, BSN Signed: 09/19/2016 4:28:32 PM By: Evlyn Kanner MD, FACS Entered By: Elliot Gurney, BSN, RN, CWS, Kim on  09/18/2016 13:48:24 [name] ([id number]) -------------------------------------------------------------------------------- Problem List Details Patient Name: [name], [name]. Date of Service: 09/18/2016 12:45 PM Medical Record Patient Account  Number: [id number] Number: Treating RN: Huel Coventry 06-Sep-1965 (50 y.o. 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 Date of Service: 09/18/2016 12:45 PM Medical Record Patient Account Number: [id number] Number: Treating RN: Huel Coventry 1965-02-15 (50 y.o. Other Clinician: Date of Birth/Sex: Female) Treating Pinkey Mcjunkin Primary Care Provider: Einar Crow Provider/Extender: G Referring Provider: Driscilla Grammes in Treatment: 0 Subjective Chief Complaint Information obtained from Patient 09/18/16; patient is here for review of wounds on her bilateral hands which are presumed to be self-inflicted bite wounds History of Present Illness (HPI) 09/18/16; this is a 51 year old woman  with severe mental retardation, cerebral palsy. She apparently has a long history of repetitive movements of her hands back and forth to her mouth predominantly involving the lateral aspect of the hands especially the inner phalangeal area between the thumb and first fingers bilaterally. She was recently put on Augmentin by primary care for concern about cellulitis. She lives at [facility]. She has a history of cerebral palsy, mental retardation, functional quadriplegia although she has reasonable use of the left greater than right arm. She also has seizure disorder Wound History Patient presents with 3 open wounds that have been present for approximately 2 weeks. Laboratory tests have been performed in the last month. Patient reportedly has tested positive for an antibiotic resistant organism. Patient History Unable to Obtain Patient History due to Altered Mental Status. Information obtained from Caregiver. Allergies No Known Drug Allergies Social History Never smoker, Marital Status - Single, Alcohol Use - Never, Drug Use - No History, Caffeine Use - Never. Medical History Respiratory Patient has history of Sleep Apnea Neurologic Patient has history of Quadriplegia FLORENTINE, DIEKMAN. ([id number]) Medical And Surgical History Notes Constitutional Symptoms (General Health) Seizure Disorder; CP; mental retardation, quadriplegia; seizures thrombocytopenia General Notes: Patient is a resident of [facility]. Objective Constitutional Sitting or standing Blood Pressure is within target range for patient.. Pulse regular and within target range for patient.Marland Kitchen Respirations regular, non-labored and within target range.. Temperature is normal and within the target range for the patient.. Patient is restless and agitated with any attempt at touching her. Full physical exam is not possible. Vitals Time Taken: 12:58 PM, Temperature: 97.6 F, Pulse: 106 bpm, Respiratory  Rate: 16 breaths/min, Blood Pressure: 118/76 mmHg. Eyes Conjunctivae clear. No discharge no scleral icterus. Gastrointestinal (GI) Soft nontender. No liver or spleen enlargement or tenderness.Marland Kitchen Psychiatric very restless and agitated. General Notes: Wound exam; there is in question are largely on the left greater than right hands. This involves the dorsal aspect of the left first metacarpophalangeal joint and the webspace of her left thumb and first finger. Also the webspace in the right first and second finger. Her intake nurse noted drainage on the right however there is no palpable tenderness over the thenar eminence bilaterally. No  drainage could be expressed. It is difficult to be certain with her degree of agitation however I don't think currently there is an active infection Integumentary (Hair, Skin) Skin and subcutaneous tissue without rashes, lesions, discoloration or ulcers. No evidence of fungal disease. Hair and skin color texture normal and healthy in appearance.. Wound #1 status is Open. Original cause of wound was Trauma. The wound is located on the Right Hand - DREYA, BUHRMAN. ([id number]) Web Between 1st and 2nd Digit. The wound measures 2cm length x 0.8cm width x 0.1cm depth; 1.257cm^2 area and 0.126cm^3 volume. There is no tunneling or undermining noted. The wound margin is flat and intact. General Notes: wounds are hard to assess. patient is easily agitated. Wound #2 status is Open. Original cause of wound was Trauma. The wound is located on the Left Hand - Web Between 1st and 2nd Digit. The wound measures 1.5cm length x 1cm width x 0.1cm depth; 1.178cm^2 area and 0.118cm^3 volume. The wound margin is flat and intact. Wound #3 status is Open. Original cause of wound was Trauma. The wound is located on the Left Hand - Dorsum. The wound measures 2.2cm length x 0.5cm width x 0.1cm depth; 0.864cm^2 area and 0.086cm^3 volume. There is a none present amount of drainage  noted. The wound margin is flat and intact. 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 Cleanse wound with mild soap and water Wound #2 Left Hand - Web Between 1st and 2nd Digit: Clean wound with Normal Saline. Cleanse wound with mild soap and water Wound #3 Left Hand - Dorsum: Clean wound with Normal Saline. Cleanse wound with mild soap and water Primary Wound Dressing: Wound #1 Right Hand - Web Between 1st and 2nd Digit: Prisma Ag Wound #2 Left Hand - Web Between 1st and 2nd Digit: Prisma Ag Wound #3 Left Hand - Dorsum: RAGINA, FENTER ([id number]) Prisma Ag Secondary Dressing: Wound #1 Right Hand - Web Between 1st and 2nd Digit: Gauze and Kerlix/Conform Other - Gloves on both hands at all times. Wound #2 Left Hand - Web Between 1st and 2nd Digit: Gauze and Kerlix/Conform Other - Gloves on both hands at all times. Wound #3 Left Hand - Dorsum: Gauze and Kerlix/Conform Other - Gloves on both hands at all times. Dressing Change Frequency: Wound #1 Right Hand - Web Between 1st and 2nd Digit: Change dressing every other day. Other: - More often as needed if soiled. Wound #2 Left Hand - Web Between 1st and 2nd Digit: Change dressing every other day. Other: - More often as needed if soiled. Wound #3 Left Hand - Dorsum: Change dressing every other day. Other: - More often as needed if soiled. Follow-up Appointments: Wound #1 Right Hand - Web Between 1st and 2nd Digit: Return Appointment in 2 weeks. Wound #2 Left Hand - Web Between 1st and 2nd Digit: Return  Appointment in 2 weeks. Wound #3 Left Hand - Dorsum: Return Appointment in 2 weeks. Home Health: Wound #1 Right Hand - Web Between 1st and 2nd Digit: Initiate Home Health for Skilled Nursing Home Health Nurse may visit PRN to address patient s  wound care needs. FACE TO FACE ENCOUNTER: MEDICARE and MEDICAID PATIENTS: I certify that this patient is under my care and that I had a face-to-face encounter that meets the physician face-to-face encounter requirements with this patient on this date. The encounter with the patient was in whole or in part for the following MEDICAL CONDITION: (primary reason for Home Healthcare) MEDICAL NECESSITY: I certify, that based on my findings, NURSING services are a medically necessary home health service. HOME BOUND STATUS: I certify that my clinical findings support that this patient is homebound (i.e., Due to illness or injury, pt requires aid of supportive devices such as crutches, cane, wheelchairs, walkers, the use of special transportation or the assistance of another person to leave their place of residence. There is a normal inability to leave the home and doing so requires considerable and taxing effort. Other absences are for medical reasons / religious services and are infrequent or of short duration when for other reasons). If current dressing causes regression in wound condition, may D/C ordered dressing product/s and apply Normal Saline Moist Dressing daily until next Wound Healing Center / Other MD appointment. Notify Wound Healing Center of regression in wound condition at [phone]. Please direct any NON-WOUND related issues/requests for orders to patient's Primary Care Physician Wound #2 Left Hand - Web Between 1st and 2nd Digit: Initiate Home Health for Skilled Nursing Home Health Nurse may visit PRN to address patient s wound care needs. FACE TO FACE ENCOUNTER: MEDICARE and MEDICAID PATIENTS: I certify that this patient is under my care  and that I had a face-to-face encounter that meets the physician face-to-face encounter RAENA, PAU ([id number]) requirements with this patient on this date. The encounter with the patient was in whole or in part for the following MEDICAL CONDITION: (primary reason for Home Healthcare) MEDICAL NECESSITY: I certify, that based on my findings, NURSING services are a medically necessary home health service. HOME BOUND STATUS: I certify that my clinical findings support that this patient is homebound (i.e., Due to illness or injury, pt requires aid of supportive devices such as crutches, cane, wheelchairs, walkers, the use of special transportation or the assistance of another person to leave their place of residence. There is a normal inability to leave the home and doing so requires considerable and taxing effort. Other absences are for medical reasons / religious services and are infrequent or of short duration when for other reasons). If current dressing causes regression in wound condition, may D/C ordered dressing product/s and apply Normal Saline Moist Dressing daily until next Wound Healing Center / Other MD appointment. Notify Wound Healing Center of regression in wound condition at [phone]. Please direct any NON-WOUND related issues/requests for orders to patient's Primary Care Physician Wound #3 Left Hand - Dorsum: Initiate Home Health for Skilled Nursing Home Health Nurse may visit PRN to address patient s wound care needs. FACE TO FACE ENCOUNTER: MEDICARE and MEDICAID PATIENTS: I certify that this patient is under my care and that I had a face-to-face encounter that meets the physician face-to-face encounter requirements with this patient on this date. The encounter with the patient was in whole or in part for the following MEDICAL CONDITION: (primary reason for Home Healthcare) MEDICAL NECESSITY: I certify, that based on my findings, NURSING services are a medically  necessary home health service. HOME BOUND STATUS: I certify that my clinical findings support that this patient is homebound (i.e., Due to illness or injury, pt requires aid of supportive devices such  as crutches, cane, wheelchairs, walkers, the use of special transportation or the assistance of another person to leave their place of residence. There is a normal inability to leave the home and doing so requires considerable and taxing effort. Other absences are for medical reasons / religious services and are infrequent or of short duration when for other reasons). If current dressing causes regression in wound condition, may D/C ordered dressing product/s and apply Normal Saline Moist Dressing daily until next Wound Healing Center / Other MD appointment. Notify Wound Healing Center of regression in wound condition at [phone]. Please direct any NON-WOUND related issues/requests for orders to patient's Primary Care Physician #1 I think the dressings here could be fairly simple such as moistened silver alginate as the wound beds look dry #2 the areas in question are going to have to be protected from her repetitive placing of these areas in her mouth whether these are bite injuries are not. They're being Macerated and baby didn't bacteria. #3 I agree with Augmentin if there were any concerned that this was cellulitis however right now it looks as though any infection has abated. No additional antibiotics were felt to be needed #4 as I understand things there is a protocol for dealing with this sort of thing in [facility] vis-- vis dressing supplies, training of staff etc. I found this to be a bit odd preferring to go with home health however we will see if this can be done in another way. [name], [name] ([id number]) #5 follow-up of this patient in 2 weeks. As mentioned I don't think there is active infection at the moment although I certainly agree with Augmentin if this was felt  to be the case before she came in here. Human bite injuries can be very significant and resultant deep tissue infection although I see none of this now Electronic Signature(s) Signed: 09/18/2016 5:01:41 PM By: Baltazar Najjarobson, Joneric Streight MD Entered By: Baltazar Najjarobson, Hamsini Verrilli on 09/18/2016 13:50:25 [name], Kandie K. ([id number]) -------------------------------------------------------------------------------- ROS/PFSH Details Patient Name: [name], [name] Date of Service: 09/18/2016 12:45 PM Medical Record Number: [id number] Patient Account Number: [id number] Date of Birth/Sex: 11-21-1965 (50 y.o. Female) Treating RN: Huel CoventryWoody, Kim Primary Care Provider: Einar CrowAnderson, Marshall Other Clinician: Referring Provider: Debbra RidingWHITAKER, JASON Treating Provider/Extender: Rudene ReBritto, Errol Weeks in Treatment: 0 Unable to Obtain Patient History due to oo Altered Mental Status Information Obtained From Caregiver Wound History Do you currently have one or more open woundso Yes How many open wounds do you currently haveo 3 Approximately how long have you had your woundso 2 weeks Constitutional Symptoms (General Health) Medical History: Past Medical History Notes: Seizure Disorder; CP; mental retardation, quadriplegia; seizures thrombocytopenia Respiratory Medical History: Positive for: Sleep Apnea Neurologic Medical History: Positive for: Quadriplegia Immunizations Pneumococcal Vaccine: Received Pneumococcal Vaccination: No Family and Social History Never smoker; Marital Status - Single; Alcohol Use: Never; Drug Use: No History; Caffeine Use: Never Notes Patient is a resident of [facility]. Electronic Signature(s) Signed: 09/18/2016 5:49:32 PM By: Elliot GurneyWoody, BSN, RN, CWS, Kim RN, BSN Signed: 09/19/2016 4:28:32 PM By: Evlyn KannerBritto, Errol MD, FACS Entered By: Elliot GurneyWoody, BSN, RN, CWS, Kim on 09/18/2016 13:12:09 [name], Dinita K. ([id number] [name]) Kost, Antonette K.  ([id number]) -------------------------------------------------------------------------------- SuperBill Details Patient Name: [name], Deitra K. Date of Service: 09/18/2016 Medical Record Patient Account Number: [id number] Number: Treating RN: Huel CoventryWoody, Kim 11-21-1965 (50 y.o. 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 L03.113 Cellulitis of right upper limb L03.114 Cellulitis of left upper limb Facility Procedures CPT4 Code: [id number] Description: 82956 - WOUND CARE VISIT-LEV 5 EST PT Modifier: Quantity: 1 Physician Procedures CPT4 Code Description: [id number] WC PHYS LEVEL 3 o NEW PT ICD-10 Description Diagnosis S60.572D Other superficial bite of hand of left hand, sub S60.571D Other superficial bite of hand of right hand, su L03.113 Cellulitis of right upper limb L03.114  Cellulitis of left upper limb Modifier: sequent encounter bsequent encounte Quantity: 1 r Electronic Signature(s) Signed: 09/18/2016 5:01:41 PM By: Baltazar Najjar MD Entered By: Baltazar Najjar on 09/18/2016 13:50:56

## 2016-10-02 ENCOUNTER — Other Ambulatory Visit
Admission: RE | Admit: 2016-10-02 | Discharge: 2016-10-02 | Disposition: A | Discharge status: Home / Self Care | Payer: Medicare Other | Source: Ambulatory Visit | Attending: Internal Medicine | Admitting: Internal Medicine

## 2016-10-02 ENCOUNTER — Encounter: Payer: Medicare Other | Attending: Internal Medicine | Admitting: Internal Medicine

## 2016-10-02 DIAGNOSIS — F72 Severe intellectual disabilities: Secondary | ICD-10-CM | POA: Diagnosis not present

## 2016-10-02 DIAGNOSIS — L03113 Cellulitis of right upper limb: Secondary | ICD-10-CM | POA: Diagnosis not present

## 2016-10-02 DIAGNOSIS — S60571D Other superficial bite of hand of right hand, subsequent encounter: Secondary | ICD-10-CM | POA: Insufficient documentation

## 2016-10-02 DIAGNOSIS — X58XXXD Exposure to other specified factors, subsequent encounter: Secondary | ICD-10-CM | POA: Diagnosis not present

## 2016-10-02 DIAGNOSIS — S61402A Unspecified open wound of left hand, initial encounter: Secondary | ICD-10-CM | POA: Diagnosis present

## 2016-10-02 DIAGNOSIS — G809 Cerebral palsy, unspecified: Secondary | ICD-10-CM | POA: Insufficient documentation

## 2016-10-02 DIAGNOSIS — L03114 Cellulitis of left upper limb: Secondary | ICD-10-CM | POA: Insufficient documentation

## 2016-10-02 DIAGNOSIS — S60572D Other superficial bite of hand of left hand, subsequent encounter: Secondary | ICD-10-CM | POA: Diagnosis not present

## 2016-10-02 DIAGNOSIS — B999 Unspecified infectious disease: Secondary | ICD-10-CM | POA: Insufficient documentation

## 2016-10-02 DIAGNOSIS — G40909 Epilepsy, unspecified, not intractable, without status epilepticus: Secondary | ICD-10-CM | POA: Diagnosis not present

## 2016-10-03 NOTE — Progress Notes (Signed)
[name], [name] ([id number]) Visit Report for 10/02/2016 Arrival Information Details Patient Name: [name], [name] Date of Service: 10/02/2016 11:00 AM Medical Record Patient Account Number: [id number] Number: Treating RN: Curtis SitesDorthy, Joanna [birth year] (50 y.o. Other Clinician: Date of Birth/Sex: Female) Treating ROBSON, MICHAEL Primary Care [name]: Einar CrowAnderson, Marshall Randell Teare/Extender: G Referring Khiree Bukhari: Ardis RowanAnderson, Marshall Weeks in Treatment: 2 Visit Information History Since Last Visit Added or deleted any medications: No Patient Arrived: Wheel Chair Any new allergies or adverse reactions: No Arrival Time: 10:58 Had a fall or experienced change in No activities of daily living that may affect Accompanied By: staff risk of falls: Transfer Assistance: None Signs or symptoms of abuse/neglect since last No Patient Identification Verified: Yes visito Secondary Verification Process Yes Hospitalized since last visit: No Completed: Has Dressing in Place as Prescribed: Yes Patient Has Alerts: Yes Pain Present Now: No Electronic Signature(s) Signed: 10/02/2016 4:37:06 PM By: Curtis Sitesorthy, Joanna Entered By: Curtis Sitesorthy, Joanna on 10/02/2016 10:59:03 [name], [name] ([id number]) -------------------------------------------------------------------------------- Clinic Level of Care Assessment Details Patient Name: [name], [name] Date of Service: 10/02/2016 11:00 AM Medical Record Patient Account Number: [id number] Number: Treating RN: Curtis SitesDorthy, Joanna [birth year] (50 y.o. Other Clinician: Date of Birth/Sex: Female) Treating ROBSON, MICHAEL Primary Care Brissia Delisa: Einar CrowAnderson, Marshall Hilman Kissling/Extender: G Referring Adler Chartrand: Ardis RowanAnderson, Marshall Weeks in Treatment: 2 Clinic Level of Care Assessment Items TOOL 4 Quantity Score []  - Use when only an EandM is performed on FOLLOW-UP visit 0 ASSESSMENTS - Nursing Assessment / Reassessment X - Reassessment of Co-morbidities  (includes updates in patient status) 1 10 X - Reassessment of Adherence to Treatment Plan 1 5 ASSESSMENTS - Wound and Skin Assessment / Reassessment []  - Simple Wound Assessment / Reassessment - one wound 0 X - Complex Wound Assessment / Reassessment - multiple wounds 3 5 []  - Dermatologic / Skin Assessment (not related to wound area) 0 ASSESSMENTS - Focused Assessment []  - Circumferential Edema Measurements - multi extremities 0 []  - Nutritional Assessment / Counseling / Intervention 0 []  - Lower Extremity Assessment (monofilament, tuning fork, pulses) 0 []  - Peripheral Arterial Disease Assessment (using hand held doppler) 0 ASSESSMENTS - Ostomy and/or Continence Assessment and Care []  - Incontinence Assessment and Management 0 []  - Ostomy Care Assessment and Management (repouching, etc.) 0 PROCESS - Coordination of Care X - Simple Patient / Family Education for ongoing care 1 15 []  - Complex (extensive) Patient / Family Education for ongoing care 0 []  - Staff obtains ChiropractorConsents, Records, Test Results / Process Orders 0 []  - Staff telephones HHA, Nursing Homes / Clarify orders / etc 0 [name], [name] ([id number]) []  - Routine Transfer to another Facility (non-emergent condition) 0 []  - Routine Hospital Admission (non-emergent condition) 0 []  - New Admissions / Manufacturing engine[employer]surance Authorizations / Ordering NPWT, Apligraf, etc. 0 []  - Emergency Hospital Admission (emergent condition) 0 X - Simple Discharge Coordination 1 10 []  - Complex (extensive) Discharge Coordination 0 PROCESS - Special Needs []  - Pediatric / Minor Patient Management 0 []  - Isolation Patient Management 0 []  - Hearing / Language / Visual special needs 0 []  - Assessment of Community assistance (transportation, D/C planning, etc.) 0 X - Additional assistance / Altered mentation 1 15 []  - Support Surface(s) Assessment (bed, cushion, seat, etc.) 0 INTERVENTIONS - Wound Cleansing / Measurement []  - Simple Wound Cleansing - one  wound 0 X - Complex Wound Cleansing - multiple wounds 3 5 X - Wound Imaging (photographs - any number of wounds) 1 5 []  - Wound Tracing (  instead of photographs) 0  - Simple Wound Measurement - one wound 0 X - Complex Wound Measurement - multiple wounds 3 5 INTERVENTIONS - Wound Dressings X - Small Wound Dressing one or multiple wounds 2 10  - Medium Wound Dressing one or multiple wounds 0  - Large Wound Dressing one or multiple wounds 0  - Application of Medications - topical 0  - Application of Medications - injection 0 [name], [name] ([id number]) INTERVENTIONS - Miscellaneous  - External ear exam 0  - Specimen Collection (cultures, biopsies, blood, body fluids, etc.) 0  - Specimen(s) / Culture(s) sent or taken to Lab for analysis 0  - Patient Transfer (multiple staff / Michiel Sites Lift / Similar devices) 0  - Simple Staple / Suture removal (25 or less) 0  - Complex Staple / Suture removal (26 or more) 0  - Hypo / Hyperglycemic Management (close monitor of Blood Glucose) 0  - Ankle / Brachial Index (ABI) - do not check if billed separately 0 X - Vital Signs 1 5 Has the patient been seen at the hospital within the last three years: Yes Total Score: 130 Level Of Care: New/Established - Level 4 Electronic Signature(s) Signed: 10/02/2016 4:37:06 PM By: Curtis Sites Entered By: Curtis Sites on 10/02/2016 13:04:16 [name] ([id number]) -------------------------------------------------------------------------------- Encounter Discharge Information Details Patient Name: [name]. Date of Service: 10/02/2016 11:00 AM Medical Record Patient Account Number: [id number] Number: Treating RN: Curtis Sites [birth year] (50 y.o. Other Clinician: Date of Birth/Sex: Female) Treating ROBSON, MICHAEL Primary Care Melinna Linarez: Einar Crow Daemian Gahm/Extender: G Referring Bowman Higbie: Ardis Rowan in Treatment: 2 Encounter Discharge  Information Items Discharge Pain Level: 0 Discharge Condition: Stable Ambulatory Status: Wheelchair Discharge Destination: Home Transportation: Private Auto Accompanied By: Shelle Iron Schedule Follow-up Appointment: Yes Medication Reconciliation completed and provided to Patient/Care No Takyah Ciaramitaro: Provided on Clinical Summary of Care: 10/02/2016 Form Type Recipient Paper Patient MB Electronic Signature(s) Signed: 10/02/2016 4:37:06 PM By: Curtis Sites Entered By: Curtis Sites on 10/02/2016 13:06:24 [name] ([id number]) -------------------------------------------------------------------------------- Multi Wound Chart Details Patient Name: [name]. Date of Service: 10/02/2016 11:00 AM Medical Record Patient Account Number: [id number] Number: Treating RN: Curtis Sites 1966/01/19 (50 y.o. Other Clinician: Date of Birth/Sex: Female) Treating ROBSON, MICHAEL Primary Care [place]ao Doo: Einar Crow Kaimani Clayson/Extender: G Referring [place] Marney: Ardis Rowan in Treatment: 2 Vital Signs Height(in): Pulse(bpm): 106 Weight(lbs): Blood Pressure 124/88 (mmHg): Body Mass Index(BMI): Temperature(F): 97.8 Respiratory Rate 14 (breaths/min): Photos: [1:No Photos] [2:No Photos] [3:No Photos] Wound Location: [1:Right Hand - Web Between 1st and 2nd Digit] [2:Left Hand - Web Between 1st and 2nd Digit] [3:Left Hand - Dorsum] Wounding Event: [1:Trauma] [2:Trauma] [3:Trauma] Primary Etiology: [1:Trauma, Other] [2:Trauma, Other] [3:Trauma, Other] Comorbid History: [1:N/A] [2:Sleep Apnea, Quadriplegia] [3:Sleep Apnea, Quadriplegia] Date Acquired: [1:08/26/2016] [2:08/26/2016] [3:08/26/2016] Weeks of Treatment: [1:2] [2:2] [3:2] Wound Status: [1:Healed - Epithelialized] [2:Open] [3:Open] Measurements L x W x D 0x0x0 [2:0.3x0.1x0.2] [3:0.8x0.5x0.1] (cm) Area (cm) : [1:0] [2:0.024] [3:0.314] Volume (cm) : [1:0] [2:0.005] [3:0.031] % Reduction in Area:  [1:100.00%] [2:98.00%] [3:63.70%] % Reduction in Volume: 100.00% [2:95.80%] [3:64.00%] Classification: [1:Partial Thickness] [2:Partial Thickness] [3:Partial Thickness] Exudate Amount: [1:N/A] [2:Large] [3:None Present] Exudate Type: [1:N/A] [2:Purulent] [3:N/A] Exudate Color: [1:N/A] [2:yellow, brown, green] [3:N/A] Wound Margin: [1:N/A] [2:Flat and Intact] [3:Flat and Intact] Granulation Amount: [1:N/A] [2:Large (67-100%)] [3:N/A] Granulation Quality: [1:N/A] [2:Pink] [3:N/A] Necrotic Amount: [1:N/A] [2:None Present (0%)] [3:N/A] Necrotic Tissue: [1:N/A] [2:N/A] [3:Eschar] Epithelialization: [1:N/A] [2:None] [3:None] Periwound Skin Texture: No Abnormalities Noted [2:Excoriation: No Induration:  No] [3:No Abnormalities Noted] Callus: No Crepitus: No Rash: No Scarring: No Periwound Skin No Abnormalities Noted Maceration: No No Abnormalities Noted Moisture: Dry/Scaly: No Periwound Skin Color: No Abnormalities Noted Atrophie Blanche: No No Abnormalities Noted Cyanosis: No Ecchymosis: No Erythema: No Hemosiderin Staining: No Mottled: No Pallor: No Rubor: No Temperature: N/A No Abnormality N/A Tenderness on No No No Palpation: Wound Preparation: N/A Ulcer Cleansing: Ulcer Cleansing: Rinsed/Irrigated with Rinsed/Irrigated with Saline Saline Topical Anesthetic Topical Anesthetic Applied: Other: lidocaine Applied: Other: lidocaine 4% 4% Treatment Notes Electronic Signature(s) Signed: 10/02/2016 4:34:43 PM By: Baltazar Najjar MD Entered By: Baltazar Najjar on 10/02/2016 12:32:10 [name], [name] ([id number]) -------------------------------------------------------------------------------- Multi-Disciplinary Care Plan Details Patient Name: [name], [name]. Date of Service: 10/02/2016 11:00 AM Medical Record Patient Account Number: [id number] Number: Treating RN: Curtis Sites 07/18/1965 (50 y.o. Other Clinician: Date of Birth/Sex: Female) Treating ROBSON,  MICHAEL Primary Care Nadelyn Enriques: Einar Crow Axell Trigueros/Extender: G Referring Birgit Nowling: Ardis Rowan in Treatment: 2 Active Inactive Electronic Signature(s) Signed: 10/02/2016 4:37:06 PM By: Curtis Sites Entered By: Curtis Sites on 10/02/2016 11:46:30 [name] ([id number]) -------------------------------------------------------------------------------- Pain Assessment Details Patient Name: [name]. Date of Service: 10/02/2016 11:00 AM Medical Record Patient Account Number: [id number] Number: Treating RN: Curtis Sites [birth year] (50 y.o. Other Clinician: Date of Birth/Sex: Female) Treating ROBSON, MICHAEL Primary Care Kenora Spayd: Einar Crow Leili Eskenazi/Extender: G Referring Haylee Mcanany: Ardis Rowan in Treatment: 2 Active Problems Location of Pain Severity and Description of Pain Patient Has Paino No Site Locations Pain Management and Medication Current Pain Management: Notes Topical or injectable lidocaine is offered to patient for acute pain when surgical debridement is performed. If needed, Patient is instructed to use over the counter pain medication for the following 24-48 hours after debridement. Wound care MDs do not prescribed pain medications. Patient has chronic pain or uncontrolled pain. Patient has been instructed to make an appointment with their Primary Care Physician for pain management. Electronic Signature(s) Signed: 10/02/2016 4:37:06 PM By: Curtis Sites Entered By: Curtis Sites on 10/02/2016 10:59:11 [name] ([id number]) -------------------------------------------------------------------------------- Patient/Caregiver Education Details Patient Name: [name]. Date of Service: 10/02/2016 11:00 AM Medical Record Patient Account Number: [id number] Number: Treating RN: Curtis Sites 1965-05-01 (50 y.o. Other Clinician: Date of Birth/Gender: Female) Treating ROBSON,  MICHAEL Primary Care Physician: Einar Crow Physician/Extender: G Referring Physician: Ardis Rowan in Treatment: 2 Education Assessment Education Provided To: Caregiver Education Topics Provided Wound/Skin Impairment: Handouts: Other: wound care as ordered and written wound care orders Methods: Demonstration, Explain/Verbal, Printed Responses: State content correctly Electronic Signature(s) Signed: 10/02/2016 4:37:06 PM By: Curtis Sites Entered By: Curtis Sites on 10/02/2016 13:06:53 [name] ([id number]) -------------------------------------------------------------------------------- Wound Assessment Details Patient Name: [name]. Date of Service: 10/02/2016 11:00 AM Medical Record Patient Account Number: [id number] Number: Treating RN: Curtis Sites [birth year] (50 y.o. Other Clinician: Date of Birth/Sex: Female) Treating ROBSON, MICHAEL Primary Care Sherryann Frese: Einar Crow Makensie Mulhall/Extender: G Referring Aleza Pew: Ardis Rowan in Treatment: 2 Wound Status Wound Number: 1 Primary Etiology: Trauma, Other Wound Location: Right Hand - Web Between 1st Wound Status: Healed - Epithelialized and 2nd Digit Wounding Event: Trauma Date Acquired: 08/26/2016 Weeks Of Treatment: 2 Clustered Wound: No Photos Photo Uploaded By: Elliot Gurney, BSN, RN, CWS, Kim on 10/02/2016 16:42:03 Wound Measurements Length: (cm) 0 Width: (cm) 0 Depth: (cm) 0 Area: (cm) 0 Volume: (cm) 0 % Reduction in Area: 100% % Reduction in Volume: 100% Wound Description Classification: Partial Thickness Periwound Skin Texture Texture Color No  Abnormalities Noted: No No Abnormalities Noted: No Moisture No Abnormalities Noted: No NORMAJEAN, NASH ([id number]) Electronic Signature(s) Signed: 10/02/2016 4:37:06 PM By: Curtis Sites Entered By: Curtis Sites on 10/02/2016 11:14:50 [name], [name]  ([id number]) -------------------------------------------------------------------------------- Wound Assessment Details Patient Name: Carolyn Rieger K. Date of Service: 10/02/2016 11:00 AM Medical Record Patient Account Number: [id number] Number: Treating RN: Curtis Sites January 27, 1965 (50 y.o. Other Clinician: Date of Birth/Sex: Female) Treating ROBSON, MICHAEL Primary Care Gaby Harney: Einar Crow [place] Wanner/Extender: G Referring Majd Tissue: Ardis Rowan in Treatment: 2 Wound Status Wound Number: 2 Primary Etiology: Trauma, Other Wound Location: Left Hand - Web Between 1st and Wound Status: Open 2nd Digit Comorbid History: Sleep Apnea, Quadriplegia Wounding Event: Trauma Date Acquired: 08/26/2016 Weeks Of Treatment: 2 Clustered Wound: No Photos Photo Uploaded By: Elliot Gurney, BSN, RN, CWS, Kim on 10/02/2016 16:42:03 Wound Measurements Length: (cm) 0.3 Width: (cm) 0.1 Depth: (cm) 0.2 Area: (cm) 0.024 Volume: (cm) 0.005 % Reduction in Area: 98% % Reduction in Volume: 95.8% Epithelialization: None Tunneling: No Undermining: No Wound Description Classification: Partial Thickness Wound Margin: Flat and Intact Exudate Amount: Large Exudate Type: Purulent Exudate Color: yellow, brown, green Foul Odor After Cleansing: No Slough/Fibrino Yes Wound Bed Granulation Amount: Large (67-100%) Exposed Structure Granulation Quality: Pink Fascia Exposed: No [name], Marlowe K. ([id number]) Necrotic Amount: None Present (0%) Fat Layer (Subcutaneous Tissue) Exposed: No Tendon Exposed: No Muscle Exposed: No Joint Exposed: No Bone Exposed: No Periwound Skin Texture Texture Color No Abnormalities Noted: No No Abnormalities Noted: No Callus: No Atrophie Blanche: No Crepitus: No Cyanosis: No Excoriation: No Ecchymosis: No Induration: No Erythema: No Rash: No Hemosiderin Staining: No Scarring: No Mottled: No Pallor: No Moisture Rubor: No No Abnormalities  Noted: No Dry / Scaly: No Temperature / Pain Maceration: No Temperature: No Abnormality Wound Preparation Ulcer Cleansing: Rinsed/Irrigated with Saline Topical Anesthetic Applied: Other: lidocaine 4%, Treatment Notes Wound #2 (Left Hand - Web Between 1st and 2nd Digit) 1. Cleansed with: Clean wound with Normal Saline 2. Anesthetic Topical Lidocaine 4% cream to wound bed prior to debridement 4. Dressing Applied: Aquacel Ag 5. Secondary Dressing Applied Gauze and Kerlix/Conform 7. Secured with Tape Other (specify in notes) Notes secured lightly with coban, caregiver applied patient's gloves and was educated to keep them on patient at all times Electronic Signature(s) Signed: 10/02/2016 4:37:06 PM By: Curtis Sites Entered By: Curtis Sites on 10/02/2016 11:15:16 [name], [name] ([id number]) -------------------------------------------------------------------------------- Wound Assessment Details Patient Name: [name]. Date of Service: 10/02/2016 11:00 AM Medical Record Patient Account Number: [id number] Number: Treating RN: Curtis Sites 09/12/1965 (50 y.o. Other Clinician: Date of Birth/Sex: Female) Treating ROBSON, MICHAEL Primary Care Verdell Kincannon: Einar Crow Milburn Freeney/Extender: G Referring Latoyna Hird: Ardis Rowan in Treatment: 2 Wound Status Wound Number: 3 Primary Etiology: Trauma, Other Wound Location: Left Hand - Dorsum Wound Status: Open Wounding Event: Trauma Comorbid History: Sleep Apnea, Quadriplegia Date Acquired: 08/26/2016 Weeks Of Treatment: 2 Clustered Wound: No Photos Photo Uploaded By: Elliot Gurney, BSN, RN, CWS, Kim on 10/02/2016 16:42:28 Wound Measurements Length: (cm) 0.8 Width: (cm) 0.5 Depth: (cm) 0.1 Area: (cm) 0.314 Volume: (cm) 0.031 % Reduction in Area: 63.7% % Reduction in Volume: 64% Epithelialization: None Tunneling: No Undermining: No Wound Description Classification: Partial Thickness Foul  Odor After C Wound Margin: Flat and Intact Slough/Fibrino Exudate Amount: None Present leansing: No No Wound Bed Necrotic Amount: Large (67-100%) Exposed Structure Necrotic Quality: Eschar Fascia Exposed: No Fat Layer (Subcutaneous Tissue) Exposed: No Tendon Exposed: No Muscle Exposed: No Critcher, Nattie K. (  [id number]) Joint Exposed: No Bone Exposed: No Periwound Skin Texture Texture Color No Abnormalities Noted: No No Abnormalities Noted: No Moisture No Abnormalities Noted: No Wound Preparation Ulcer Cleansing: Rinsed/Irrigated with Saline Topical Anesthetic Applied: Other: lidocaine 4%, Treatment Notes Wound #3 (Left Hand - Dorsum) 1. Cleansed with: Clean wound with Normal Saline 2. Anesthetic Topical Lidocaine 4% cream to wound bed prior to debridement 4. Dressing Applied: Aquacel Ag 5. Secondary Dressing Applied Gauze and Kerlix/Conform 7. Secured with Tape Other (specify in notes) Notes secured lightly with coban, caregiver applied patient's gloves and was educated to keep them on patient at all times Electronic Signature(s) Signed: 10/02/2016 4:37:06 PM By: Curtis Sites Entered By: Curtis Sites on 10/02/2016 11:15:36 Sawyer, [name] ([id number]) -------------------------------------------------------------------------------- Vitals Details Patient Name: [name]. Date of Service: 10/02/2016 11:00 AM Medical Record Patient Account Number: [id number] Number: Treating RN: Curtis Sites 1965/08/10 (50 y.o. Other Clinician: Date of Birth/Sex: Female) Treating ROBSON, MICHAEL Primary Care Mervyn Pflaum: Einar Crow Giankarlo Leamer/Extender: G Referring Mikaylah Libbey: Ardis Rowan in Treatment: 2 Vital Signs Time Taken: 10:59 Temperature (F): 97.8 Pulse (bpm): 106 Respiratory Rate (breaths/min): 14 Blood Pressure (mmHg): 124/88 Reference Range: 80 - 120 mg / dl Electronic Signature(s) Signed: 10/02/2016 4:37:06 PM By: Curtis Sites Entered By: Curtis Sites on 10/02/2016 11:02:47

## 2016-10-03 NOTE — Progress Notes (Signed)
[name], Kaylamarie K. ([id number]) Visit Report for 10/02/2016 HPI Details Patient Name: [name], [name] Date of Service: 10/02/2016 11:00 AM Medical Record Patient Account Number: [id number] Number: Treating RN: Curtis SitesDorthy, Joanna 1965/05/02 (50 y.o. Other Clinician: Date of Birth/Sex: Female) Treating Myisha Pickerel Primary Care Provider: Einar CrowAnderson, Marshall Provider/Extender: G Referring Provider: Ardis RowanAnderson, Marshall Weeks in Treatment: 2 History of Present Illness HPI Description: 09/18/16; this is a 51 year old woman with severe mental retardation, cerebral palsy. She apparently has a long history of repetitive movements of her hands back and forth to her mouth predominantly involving the lateral aspect of the hands especially the inner phalangeal area between the thumb and first fingers bilaterally. She was recently put on Augmentin by primary care for concern about cellulitis. She lives at [facility]. She has a history of cerebral palsy, mental retardation, functional quadriplegia although she has reasonable use of the left greater than right arm. She also has seizure disorder 10/02/16; the facility has obtained her gloves for her hands this is really helped. There is nothing open on the right hand however in the webspace between her thumb and first finger purulent drainage noted by her intake nurse. Small probing wound Electronic Signature(s) Signed: 10/02/2016 4:34:43 PM By: Baltazar Najjarobson, Sergio Zawislak MD Entered By: Baltazar Najjarobson, Symeon Puleo on 10/02/2016 12:32:54 [name], Keyleen K. ([id number]) -------------------------------------------------------------------------------- Physical Exam Details Patient Name: [name], [name] Date of Service: 10/02/2016 11:00 AM Medical Record Patient Account Number: [id number] Number: Treating RN: Curtis SitesDorthy, Joanna 1965/05/02 (50 y.o. Other Clinician: Date of Birth/Sex: Female) Treating Jamaine Quintin Primary Care Provider: Einar CrowAnderson,  Marshall Provider/Extender: G Referring Provider: Ardis RowanAnderson, Marshall Weeks in Treatment: 2 Constitutional Sitting or standing Blood Pressure is within target range for patient.. Pulse regular and within target range for patient.Marland Kitchen. Respirations regular, non-labored and within target range.. Temperature is normal and within the target range for the patient.Marland Kitchen. Appears in no distress. Very easily agitated, full exam is not possible. Eyes Conjunctivae clear. No discharge. Lymphatic None palpable in the epitrochlear or axillary areas bilaterally. Notes Wound exam; there is no open area on the right hand on the left hand the only remaining area is in the webspace of the left thumb and first finger. Still a probing wound with purulent drainage per our intake nurse. I elected to go ahead and cultured this area. Electronic Signature(s) Signed: 10/02/2016 4:34:43 PM By: Baltazar Najjarobson, Donalee Gaumond MD Entered By: Baltazar Najjarobson, Auriella Wieand on 10/02/2016 12:34:58 [name], Crystalyn K. ([id number]) -------------------------------------------------------------------------------- Physician Orders Details Patient Name: [name], [name] Date of Service: 10/02/2016 11:00 AM Medical Record Patient Account Number: [id number] Number: Treating RN: Curtis SitesDorthy, Joanna 1965/05/02 (50 y.o. Other Clinician: Date of Birth/Sex: Female) Treating Rishaan Gunner Primary Care Provider: Einar CrowAnderson, Marshall Provider/Extender: G Referring Provider: Ardis RowanAnderson, Marshall Weeks in Treatment: 2 Verbal / Phone Orders: No Diagnosis Coding Wound Cleansing Wound #2 Left Hand - Web Between 1st and 2nd Digit o Clean wound with Normal Saline. o Cleanse wound with mild soap and water Wound #3 Left Hand - Dorsum o Clean wound with Normal Saline. o Cleanse wound with mild soap and water Primary Wound Dressing Wound #2 Left Hand - Web Between 1st and 2nd Digit o Aquacel Ag Wound #3 Left Hand - Dorsum o Aquacel Ag Secondary  Dressing Wound #2 Left Hand - Web Between 1st and 2nd Digit o Gauze and Kerlix/Conform o Other - Gloves on both hands at all times. Wound #3 Left Hand - Dorsum o Gauze and Kerlix/Conform o Other - Gloves on both hands at all  times. Dressing Change Frequency Wound #2 Left Hand - Web Between 1st and 2nd Digit o Change dressing every other day. o Other: - More often as needed if soiled. Wound #3 Left Hand - Dorsum o Change dressing every other day. o Other: - More often as needed if soiled. SAORI, UMHOLTZ ([id number]) Follow-up Appointments Wound #2 Left Hand - Web Between 1st and 2nd Digit o Return Appointment in 1 week. Wound #3 Left Hand - Dorsum o Return Appointment in 1 week. Home Health Wound #2 Left Hand - Web Between 1st and 2nd Digit o Continue Home Health Visits - Per caregiver, Facility has RN that does dressing changes. o Home Health Nurse may visit PRN to address patientos wound care needs. o FACE TO FACE ENCOUNTER: MEDICARE and MEDICAID PATIENTS: I certify that this patient is under my care and that I had a face-to-face encounter that meets the physician face-to-face encounter requirements with this patient on this date. The encounter with the patient was in whole or in part for the following MEDICAL CONDITION: (primary reason for Home Healthcare) MEDICAL NECESSITY: I certify, that based on my findings, NURSING services are a medically necessary home health service. HOME BOUND STATUS: I certify that my clinical findings support that this patient is homebound (i.e., Due to illness or injury, pt requires aid of supportive devices such as crutches, cane, wheelchairs, walkers, the use of special transportation or the assistance of another person to leave their place of residence. There is a normal inability to leave the home and doing so requires considerable and taxing effort. Other absences are for medical reasons / religious services and are  infrequent or of short duration when for other reasons). o If current dressing causes regression in wound condition, may D/C ordered dressing product/s and apply Normal Saline Moist Dressing daily until next Wound Healing Center / Other MD appointment. Notify Wound Healing Center of regression in wound condition at [phone]. o Please direct any NON-WOUND related issues/requests for orders to patient's Primary Care Physician Wound #3 Left Hand - Dorsum o Continue Home Health Visits - Per caregiver, Facility has RN that does dressing changes. o Home Health Nurse may visit PRN to address patientos wound care needs. o FACE TO FACE ENCOUNTER: MEDICARE and MEDICAID PATIENTS: I certify that this patient is under my care and that I had a face-to-face encounter that meets the physician face-to-face encounter requirements with this patient on this date. The encounter with the patient was in whole or in part for the following MEDICAL CONDITION: (primary reason for Home Healthcare) MEDICAL NECESSITY: I certify, that based on my findings, NURSING services are a medically necessary home health service. HOME BOUND STATUS: I certify that my clinical findings support that this patient is homebound (i.e., Due to illness or injury, pt requires aid of supportive devices such as crutches, cane, wheelchairs, walkers, the use of special transportation or the assistance of another person to leave their place of residence. There is a normal inability to leave the home and doing so requires considerable and taxing effort. Other absences are for medical reasons / religious services and are infrequent or of short duration when for other reasons). o If current dressing causes regression in wound condition, may D/C ordered dressing product/s and apply Normal Saline Moist Dressing daily until next Wound Healing Center / Other MD appointment. Notify Wound Healing Center of regression in wound condition at  [phone]. o Please direct any NON-WOUND related issues/requests for orders to patient's Primary Care  Physician JEMEKA, WAGLER ([id number]) Laboratory o Bacteria identified in Wound by Culture (MICRO) oooo LOINC Code: [id number] oooo Convenience Name: Wound culture routine Electronic Signature(s) Signed: 10/02/2016 4:34:43 PM By: Baltazar Najjar MD Signed: 10/02/2016 4:37:06 PM By: Curtis Sites Entered By: Curtis Sites on 10/02/2016 11:48:58 Carolyn Orleans ([id number]) -------------------------------------------------------------------------------- Problem List Details Patient Name: CYNTHIE, GARMON. Date of Service: 10/02/2016 11:00 AM Medical Record Patient Account Number: [id number] Number: Treating RN: Curtis Sites 03-03-1965 (50 y.o. Other Clinician: Date of Birth/Sex: Female) Treating Alajia Schmelzer Primary Care Provider: Einar Crow Provider/Extender: G Referring Provider: Ardis Rowan in Treatment: 2 Active Problems ICD-10 Encounter Code Description Active Date Diagnosis S60.572D Other superficial bite of hand of left hand, subsequent 09/18/2016 Yes encounter S60.571D Other superficial bite of hand of right hand, subsequent 09/18/2016 Yes encounter L03.113 Cellulitis of right upper limb 09/18/2016 Yes L03.114 Cellulitis of left upper limb 09/18/2016 Yes Inactive Problems Resolved Problems Electronic Signature(s) Signed: 10/02/2016 4:34:43 PM By: Baltazar Najjar MD Entered By: Baltazar Najjar on 10/02/2016 12:32:00 Carolyn Orleans ([id number]) -------------------------------------------------------------------------------- Progress Note Details Patient Name: Carolyn Orleans. Date of Service: 10/02/2016 11:00 AM Medical Record Patient Account Number: [id number] Number: Treating RN: Curtis Sites [birth year] (50 y.o. Other Clinician: Date of Birth/Sex: Female) Treating Keyondra Lagrand Primary Care Provider:  Einar Crow Provider/Extender: G Referring Provider: Ardis Rowan in Treatment: 2 Subjective History of Present Illness (HPI) 09/18/16; this is a 51 year old woman with severe mental retardation, cerebral palsy. She apparently has a long history of repetitive movements of her hands back and forth to her mouth predominantly involving the lateral aspect of the hands especially the inner phalangeal area between the thumb and first fingers bilaterally. She was recently put on Augmentin by primary care for concern about cellulitis. She lives at [facility]. She has a history of cerebral palsy, mental retardation, functional quadriplegia although she has reasonable use of the left greater than right arm. She also has seizure disorder 10/02/16; the facility has obtained her gloves for her hands this is really helped. There is nothing open on the right hand however in the webspace between her thumb and first finger purulent drainage noted by her intake nurse. Small probing wound Objective Constitutional Sitting or standing Blood Pressure is within target range for patient.. Pulse regular and within target range for patient.Marland Kitchen Respirations regular, non-labored and within target range.. Temperature is normal and within the target range for the patient.Marland Kitchen Appears in no distress. Very easily agitated, full exam is not possible. Vitals Time Taken: 10:59 AM, Temperature: 97.8 F, Pulse: 106 bpm, Respiratory Rate: 14 breaths/min, Blood Pressure: 124/88 mmHg. Eyes Conjunctivae clear. No discharge. Lymphatic None palpable in the epitrochlear or axillary areas bilaterally. LATERA, MCLIN ([id number]) General Notes: Wound exam; there is no open area on the right hand on the left hand the only remaining area is in the webspace of the left thumb and first finger. Still a probing wound with purulent drainage per our intake nurse. I elected to go ahead and cultured this  area. Integumentary (Hair, Skin) Wound #1 status is Healed - Epithelialized. Original cause of wound was Trauma. The wound is located on the Right Hand - Web Between 1st and 2nd Digit. The wound measures 0cm length x 0cm width x 0cm depth; 0cm^2 area and 0cm^3 volume. Wound #2 status is Open. Original cause of wound was Trauma. The wound is located on the Left Hand - Web Between 1st and 2nd Digit. The wound  measures 0.3cm length x 0.1cm width x 0.2cm depth; 0.024cm^2 area and 0.005cm^3 volume. There is no tunneling or undermining noted. There is a large amount of purulent drainage noted. The wound margin is flat and intact. There is large (67-100%) pink granulation within the wound bed. There is no necrotic tissue within the wound bed. The periwound skin appearance did not exhibit: Callus, Crepitus, Excoriation, Induration, Rash, Scarring, Dry/Scaly, Maceration, Atrophie Blanche, Cyanosis, Ecchymosis, Hemosiderin Staining, Mottled, Pallor, Rubor, Erythema. Periwound temperature was noted as No Abnormality. Wound #3 status is Open. Original cause of wound was Trauma. The wound is located on the Left Hand - Dorsum. The wound measures 0.8cm length x 0.5cm width x 0.1cm depth; 0.314cm^2 area and 0.031cm^3 volume. There is no tunneling or undermining noted. There is a none present amount of drainage noted. The wound margin is flat and intact. There is a large (67-100%) amount of necrotic tissue within the wound bed including Eschar. Assessment Active Problems ICD-10 S60.572D - Other superficial bite of hand of left hand, subsequent encounter S60.571D - Other superficial bite of hand of right hand, subsequent encounter L03.113 - Cellulitis of right upper limb L03.114 - Cellulitis of left upper limb Plan Wound Cleansing: Wound #2 Left Hand - Web Between 1st and 2nd Digit: Clean wound with Normal Saline. Cleanse wound with mild soap and water Wound #3 Left Hand - Dorsum: Clean wound with  Normal Saline. MAGALENE, MCLEAR ([id number]) Cleanse wound with mild soap and water Primary Wound Dressing: Wound #2 Left Hand - Web Between 1st and 2nd Digit: Aquacel Ag Wound #3 Left Hand - Dorsum: Aquacel Ag Secondary Dressing: Wound #2 Left Hand - Web Between 1st and 2nd Digit: Gauze and Kerlix/Conform Other - Gloves on both hands at all times. Wound #3 Left Hand - Dorsum: Gauze and Kerlix/Conform Other - Gloves on both hands at all times. Dressing Change Frequency: Wound #2 Left Hand - Web Between 1st and 2nd Digit: Change dressing every other day. Other: - More often as needed if soiled. Wound #3 Left Hand - Dorsum: Change dressing every other day. Other: - More often as needed if soiled. Follow-up Appointments: Wound #2 Left Hand - Web Between 1st and 2nd Digit: Return Appointment in 1 week. Wound #3 Left Hand - Dorsum: Return Appointment in 1 week. Home Health: Wound #2 Left Hand - Web Between 1st and 2nd Digit: Continue Home Health Visits - Per caregiver, Facility has RN that does dressing changes. Home Health Nurse may visit PRN to address patient s wound care needs. FACE TO FACE ENCOUNTER: MEDICARE and MEDICAID PATIENTS: I certify that this patient is under my care and that I had a face-to-face encounter that meets the physician face-to-face encounter requirements with this patient on this date. The encounter with the patient was in whole or in part for the following MEDICAL CONDITION: (primary reason for Home Healthcare) MEDICAL NECESSITY: I certify, that based on my findings, NURSING services are a medically necessary home health service. HOME BOUND STATUS: I certify that my clinical findings support that this patient is homebound (i.e., Due to illness or injury, pt requires aid of supportive devices such as crutches, cane, wheelchairs, walkers, the use of special transportation or the assistance of another person to leave their place of residence. There is  a normal inability to leave the home and doing so requires considerable and taxing effort. Other absences are for medical reasons / religious services and are infrequent or of short duration when for other reasons).  If current dressing causes regression in wound condition, may D/C ordered dressing product/s and apply Normal Saline Moist Dressing daily until next Wound Healing Center / Other MD appointment. Notify Wound Healing Center of regression in wound condition at [phone]. Please direct any NON-WOUND related issues/requests for orders to patient's Primary Care Physician Wound #3 Left Hand - Dorsum: Continue Home Health Visits - Per caregiver, Facility has RN that does dressing changes. Home Health Nurse may visit PRN to address patient s wound care needs. FACE TO FACE ENCOUNTER: MEDICARE and MEDICAID PATIENTS: I certify that this patient is under my care and that I had a face-to-face encounter that meets the physician face-to-face encounter requirements with this patient on this date. The encounter with the patient was in whole or in part for the following MEDICAL CONDITION: (primary reason for Home Healthcare) MEDICAL NECESSITY: I certify, that based on my findings, NURSING services are a medically necessary home health service. HOME MEIGHAN, TRETO ([id number]) BOUND STATUS: I certify that my clinical findings support that this patient is homebound (i.e., Due to illness or injury, pt requires aid of supportive devices such as crutches, cane, wheelchairs, walkers, the use of special transportation or the assistance of another person to leave their place of residence. There is a normal inability to leave the home and doing so requires considerable and taxing effort. Other absences are for medical reasons / religious services and are infrequent or of short duration when for other reasons). If current dressing causes regression in wound condition, may D/C ordered dressing product/s and  apply Normal Saline Moist Dressing daily until next Wound Healing Center / Other MD appointment. Notify Wound Healing Center of regression in wound condition at [phone]. Please direct any NON-WOUND related issues/requests for orders to patient's Primary Care Physician Laboratory ordered were: Wound culture routine #1 silver alginate to the wound area #2 empiric Augmentin suspension 250 mg per 5 mL 10 mL's twice a day for 10 days. This will cover the range of bacteria for a human bite injury or at least a human oral contamination injury. #3 I think the gloves are doing a good job here her right hand is healed does not need a specific dressing Electronic Signature(s) Signed: 10/02/2016 4:34:43 PM By: Baltazar Najjar MD Entered By: Baltazar Najjar on 10/02/2016 12:36:18 Carolyn Orleans ([id number]) -------------------------------------------------------------------------------- SuperBill Details Patient Name: Carolyn Orleans. Date of Service: 10/02/2016 Medical Record Patient Account Number: [id number] Number: Treating RN: Curtis Sites 07-16-1965 (50 y.o. Other Clinician: Date of Birth/Sex: Female) Treating Caven Perine Primary Care Provider: Einar Crow Provider/Extender: G Referring Provider: Ardis Rowan in Treatment: 2 Diagnosis Coding ICD-10 Codes Code Description [id number] Other superficial bite of hand of left hand, subsequent encounter S60.571D Other superficial bite of hand of right hand, subsequent encounter L03.113 Cellulitis of right upper limb L03.114 Cellulitis of left upper limb Facility Procedures CPT4 Code: [id number] Description: 99214 - WOUND CARE VISIT-LEV 4 EST PT Modifier: Quantity: 1 Physician Procedures CPT4 Code Description: [id number] 99213 - WC PHYS LEVEL 3 - EST PT ICD-10 Description Diagnosis S60.572D Other superficial bite of hand of left hand, subse L03.114 Cellulitis of left upper limb Modifier: quent  encounte Quantity: 1 r Electronic Signature(s) Signed: 10/02/2016 4:34:43 PM By: Baltazar Najjar MD Signed: 10/02/2016 4:37:06 PM By: Curtis Sites Entered By: Curtis Sites on 10/02/2016 13:04:25

## 2016-10-05 LAB — AEROBIC CULTURE W GRAM STAIN (SUPERFICIAL SPECIMEN): Gram Stain: NONE SEEN

## 2016-10-05 LAB — AEROBIC CULTURE  (SUPERFICIAL SPECIMEN)

## 2016-10-09 ENCOUNTER — Encounter: Payer: Medicare Other | Admitting: Internal Medicine

## 2016-10-09 DIAGNOSIS — S60572D Other superficial bite of hand of left hand, subsequent encounter: Secondary | ICD-10-CM | POA: Diagnosis not present

## 2016-10-11 NOTE — Progress Notes (Signed)
Carolyn, [name] ([id number]) Visit Report for 10/09/2016 HPI Details Patient Name: [name], [name]. Date of Service: 10/09/2016 10:15 AM Medical Record Patient Account Number: [id number] Number: Treating RN: Huel Coventry 1965-03-23 (50 y.o. Other Clinician: Date of Birth/Sex: Female) Treating Shardee Dieu Primary Care Provider: Einar Crow Provider/Extender: G Referring Provider: Ardis Rowan in Treatment: 3 History of Present Illness HPI Description: 09/18/16; this is a 51 year old woman with severe mental retardation, cerebral palsy. She apparently has a long history of repetitive movements of her hands back and forth to her mouth predominantly involving the lateral aspect of the hands especially the inner phalangeal area between the thumb and first fingers bilaterally. She was recently put on Augmentin by primary care for concern about cellulitis. She lives at [facility]. She has a history of cerebral palsy, mental retardation, functional quadriplegia although she has reasonable use of the left greater than right arm. She also has seizure disorder 10/02/16; the facility has obtained her gloves for her hands this is really helped. There is nothing open on the right hand however in the webspace between her thumb and first finger purulent drainage noted by her intake nurse. Small probing wound. 10/09/16; culture I did last week showed methicillin sensitive staph aureus that should've been well covered by the Augmentin. I gave her. In the meantime the facility where she is seems to be concerned about the glove. They gave her last time is being some form of restraint which is an issue, I'm well familiar with. We have been using silver alginate to the wound in the webspace between her thumb and first finger. Still looking at the left hand in the webspace between the him and first finger Electronic Signature(s) Signed: 10/09/2016 5:33:43 PM By:  Baltazar Najjar MD Entered By: Baltazar Najjar on 10/09/2016 12:19:33 [name] ([id number]) -------------------------------------------------------------------------------- Physical Exam Details Patient Name: [name], [name]. Date of Service: 10/09/2016 10:15 AM Medical Record Patient Account Number: [id number] Number: Treating RN: Huel Coventry 03-12-1965 (50 y.o. Other Clinician: Date of Birth/Sex: Female) Treating Makila Colombe Primary Care Provider: Einar Crow Provider/Extender: G Referring Provider: Ardis Rowan in Treatment: 3 Constitutional Sitting or standing Blood Pressure is within target range for patient.. Pulse regular and within target range for patient.Marland Kitchen Respirations regular, non-labored and within target range.. Temperature is normal and within the target range for the patient.Marland Kitchen appears in no distress. Eyes Conjunctivae clear. No discharge. Respiratory Respiratory effort is easy and symmetric bilaterally. Rate is normal at rest and on room air.Marland Kitchen Lymphatic None palpable in the epitrochlear or axilla on the left. Musculoskeletal . No joint involvement in the right hand. Psychiatric Severe mental retardation. Notes Wound exam; there is is still an open area on the left hand between the left thumb and first finger area. Base of this is clean. There is no drainage. This appears better than last week. Palpation over the thenar eminence does not cause any pain. 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 Date of Service: 10/09/2016 10:15 AM Medical Record Patient Account Number: [id number] Number: Treating RN: Huel Coventry Nov 24, 1965 (50 y.o.  Other Clinician: Date of Birth/Sex: Female) Treating Alannis Hsia Primary Care Provider: Einar Crow Provider/Extender: G Referring Provider: Ardis Rowan in Treatment: 3 Verbal / Phone Orders: No Diagnosis Coding Wound Cleansing Wound #2 Left Hand -  Web Between 1st and 2nd Digit o Clean wound with Normal Saline. o Cleanse wound with mild soap and water Primary Wound Dressing Wound #2 Left Hand - Web Between 1st and 2nd Digit o Aquacel Ag Secondary Dressing Wound #2 Left Hand - Web Between 1st and 2nd Digit o Gauze and Kerlix/Conform o Other - Gloves on both hands at all times. Dressing Change Frequency Wound #2 Left Hand - Web Between 1st and 2nd Digit o Change dressing every other day. o Other: - More often as needed if soiled. Follow-up Appointments Wound #2 Left Hand - Web Between 1st and 2nd Digit o Return Appointment in 1 week. Home Health Wound #2 Left Hand - Web Between 1st and 2nd Digit o Continue Home Health Visits - Per caregiver, Facility has RN that does dressing changes. o Home Health Nurse may visit PRN to address patientos wound care needs. o FACE TO FACE ENCOUNTER: MEDICARE and MEDICAID PATIENTS: I certify that this patient is under my care and that I had a face-to-face encounter that meets the physician face-to-face encounter requirements with this patient on this date. The encounter with the patient was in whole or in part for the following MEDICAL CONDITION: (primary reason for Home Healthcare) MEDICAL NECESSITY: I certify, that based on my findings, NURSING services are a medically necessary home health service. HOME BOUND STATUS: I certify that my clinical findings support that this patient is homebound (i.e., Due to illness or injury, pt requires aid of Carolyn, [name]. ([id number]) supportive devices such as crutches, cane, wheelchairs, walkers, the use of special transportation or the assistance of another  person to leave their place of residence. There is a normal inability to leave the home and doing so requires considerable and taxing effort. Other absences are for medical reasons / religious services and are infrequent or of short duration when for other reasons). o If current dressing causes regression in wound condition, may D/C ordered dressing product/s and apply Normal Saline Moist Dressing daily until next Wound Healing Center / Other MD appointment. Notify Wound Healing Center of regression in wound condition at [phone]. o Please direct any NON-WOUND related issues/requests for orders to patient's Primary Care Physician Electronic Signature(s) Signed: 10/09/2016 5:33:43 PM By: Baltazar Najjar MD Signed: 10/09/2016 6:22:17 PM By: Elliot Gurney, BSN, RN, CWS, Kim RN, BSN Entered By: Elliot Gurney, BSN, RN, CWS, Kim on 10/09/2016 11:05:53 [name] ([id number]) -------------------------------------------------------------------------------- Problem List Details Patient Name: CASHLYN, HUGULEY. Date of Service: 10/09/2016 10:15 AM Medical Record Patient Account Number: [id number] Number: Treating RN: Huel Coventry [birth year] (50 y.o. Other Clinician: Date of Birth/Sex: Female) Treating Lew Prout Primary Care Provider: Einar Crow Provider/Extender: G Referring Provider: Ardis Rowan in Treatment: 3 Active Problems ICD-10 Encounter Code Description Active Date Diagnosis S60.572D Other superficial bite of hand of left hand, subsequent 09/18/2016 Yes encounter S60.571D Other superficial bite of hand of right hand, subsequent 09/18/2016 Yes encounter L03.113 Cellulitis of right upper limb 09/18/2016 Yes L03.114 Cellulitis of left upper limb 09/18/2016 Yes Inactive Problems Resolved Problems Electronic Signature(s) Signed: 10/09/2016 5:33:43 PM By: Baltazar Najjar MD Entered By: Baltazar Najjar on 10/09/2016 12:09:18 [name]  ([id number]) -------------------------------------------------------------------------------- Progress Note Details Patient Name: [name]. Date of Service: 10/09/2016 10:15 AM Medical Record Patient Account Number: [id number] Number: Treating RN: Huel Coventry 1965/09/02 (50 y.o. Other Clinician: Date of Birth/Sex: Female) Treating Kamaiyah Uselton Primary Care Provider: Einar Crow Provider/Extender: G Referring Provider: Ardis Rowan in Treatment: 3 Subjective History  of Present Illness (HPI) 09/18/16; this is a 51 year old woman with severe mental retardation, cerebral palsy. She apparently has a long history of repetitive movements of her hands back and forth to her mouth predominantly involving the lateral aspect of the hands especially the inner phalangeal area between the thumb and first fingers bilaterally. She was recently put on Augmentin by primary care for concern about cellulitis. She lives at [facility]. She has a history of cerebral palsy, mental retardation, functional quadriplegia although she has reasonable use of the left greater than right arm. She also has seizure disorder 10/02/16; the facility has obtained her gloves for her hands this is really helped. There is nothing open on the right hand however in the webspace between her thumb and first finger purulent drainage noted by her intake nurse. Small probing wound. 10/09/16; culture I did last week showed methicillin sensitive staph aureus that should've been well covered by the Augmentin. I gave her. In the meantime the facility where she is seems to be concerned about the glove. They gave her last time is being some form of restraint which is an issue, I'm well familiar with. We have been using silver alginate to the wound in the webspace between her thumb and first finger. Still looking at the left hand in the webspace between the him and first  finger Objective Constitutional Sitting or standing Blood Pressure is within target range for patient.. Pulse regular and within target range for patient.Marland Kitchen Respirations regular, non-labored and within target range.. Temperature is normal and within the target range for the patient.Marland Kitchen appears in no distress. Vitals Time Taken: 10:40 AM, Temperature: 97.7 F, Pulse: 71 bpm, Respiratory Rate: 16 breaths/min, Blood Pressure: 137/87 mmHg. Eyes Kopischke, Jennelle K. ([id number]) Conjunctivae clear. No discharge. Respiratory Respiratory effort is easy and symmetric bilaterally. Rate is normal at rest and on room air.Marland Kitchen Lymphatic None palpable in the epitrochlear or axilla on the left. Musculoskeletal No joint involvement in the right hand. Psychiatric Severe mental retardation. General Notes: Wound exam; there is is still an open area on the left hand between the left thumb and first finger area. Base of this is clean. There is no drainage. This appears better than last week. Palpation over the thenar eminence does not cause any pain. This is also an improvement Integumentary (Hair, Skin) Wound #2 status is Open. Original cause of wound was Trauma. The wound is located on the Left Hand - Web Between 1st and 2nd Digit. The wound measures 1.1cm length x 0.7cm width x 0.2cm depth; 0.605cm^2 area and 0.121cm^3 volume. There is no tunneling or undermining noted. There is a large amount of sanguinous drainage noted. The wound margin is flat and intact. There is large (67-100%) pink granulation within the wound bed. There is no necrotic tissue within the wound bed. The periwound skin appearance exhibited: Erythema. The periwound skin appearance did not exhibit: Callus, Crepitus, Excoriation, Induration, Rash, Scarring, Dry/Scaly, Maceration, Atrophie Blanche, Cyanosis, Ecchymosis, Hemosiderin Staining, Mottled, Pallor, Rubor. The surrounding wound skin color is noted with erythema which  is circumferential. Periwound temperature was noted as No Abnormality. The periwound has tenderness on palpation. Wound #3 status is Healed - Epithelialized. Original cause of wound was Trauma. The wound is located on the Left Hand - Dorsum. The wound measures 0cm length x 0cm width x 0cm depth; 0cm^2 area and 0cm^3 volume. Assessment Active Problems ICD-10 S60.572D - Other superficial bite of hand of left hand, subsequent encounter S60.571D - Other superficial  bite of hand of right hand, subsequent encounter L03.113 - Cellulitis of right upper limb L03.114 - Cellulitis of left upper limb [name], [name] ([id number]) Plan Wound Cleansing: Wound #2 Left Hand - Web Between 1st and 2nd Digit: Clean wound with Normal Saline. Cleanse wound with mild soap and water Primary Wound Dressing: Wound #2 Left Hand - Web Between 1st and 2nd Digit: Aquacel Ag Secondary Dressing: Wound #2 Left Hand - Web Between 1st and 2nd Digit: Gauze and Kerlix/Conform Other - Gloves on both hands at all times. Dressing Change Frequency: Wound #2 Left Hand - Web Between 1st and 2nd Digit: Change dressing every other day. Other: - More often as needed if soiled. Follow-up Appointments: Wound #2 Left Hand - Web Between 1st and 2nd Digit: Return Appointment in 1 week. Home Health: Wound #2 Left Hand - Web Between 1st and 2nd Digit: Continue Home Health Visits - Per caregiver, Facility has RN that does dressing changes. Home Health Nurse may visit PRN to address patient s wound care needs. FACE TO FACE ENCOUNTER: MEDICARE and MEDICAID PATIENTS: I certify that this patient is under my care and that I had a face-to-face encounter that meets the physician face-to-face encounter requirements with this patient on this date. The encounter with the patient was in whole or in part for the following MEDICAL CONDITION: (primary reason for Home Healthcare) MEDICAL NECESSITY: I certify, that based on my findings,  NURSING services are a medically necessary home health service. HOME BOUND STATUS: I certify that my clinical findings support that this patient is homebound (i.e., Due to illness or injury, pt requires aid of supportive devices such as crutches, cane, wheelchairs, walkers, the use of special transportation or the assistance of another person to leave their place of residence. There is a normal inability to leave the home and doing so requires considerable and taxing effort. Other absences are for medical reasons / religious services and are infrequent or of short duration when for other reasons). If current dressing causes regression in wound condition, may D/C ordered dressing product/s and apply Normal Saline Moist Dressing daily until next Wound Healing Center / Other MD appointment. Notify Wound Healing Center of regression in wound condition at [phone]. Please direct any NON-WOUND related issues/requests for orders to patient's Primary Care Physician I'm going to continue with the silver alginate Puccinelli, Shantavia K. ([id number]) #2. Complete the Augmentin, any reminiscence of infection seem a lot better. #3 I was asked to write a prescription for gloves as there was some concern that what they had on last week which really seems quite appropriate to me is a restraint. The patient is not able to understand the consequences of continually biting herself or contaminating open wounds. I really don't see this is a restraint. I understand how state inspector's can look at facilities in this regard. However is very little option here and recurrent infection and wounds in people's hands can become a limb threatening issue. Electronic Signature(s) Signed: 10/09/2016 12:20:22 PM By: Baltazar Najjar MD Entered By: Baltazar Najjar on 10/09/2016 12:20:22 [name] ([id number]) -------------------------------------------------------------------------------- SuperBill Details Patient Name:  [name]. Date of Service: 10/09/2016 Medical Record Patient Account Number: [id number] Number: Treating RN: Huel Coventry 19-May-1965 (50 y.o. Other Clinician: Date of Birth/Sex: Female) Treating Carman Auxier Primary Care Provider: Einar Crow Provider/Extender: G Referring Provider: Ardis Rowan in Treatment: 3 Diagnosis Coding ICD-10 Codes Code Description [phone] Other superficial bite of hand of left hand, subsequent encounter S60.571D Other  superficial bite of hand of right hand, subsequent encounter L03.113 Cellulitis of right upper limb L03.114 Cellulitis of left upper limb Facility Procedures CPT4 Code: [id number] Description: [phone] - WOUND CARE VISIT-LEV 2 EST PT Modifier: Quantity: 1 Physician Procedures CPT4 Code Description: [id number] - WC PHYS LEVEL 3 - EST PT ICD-10 Description Diagnosis S60.572D Other superficial bite of hand of left hand, subse L03.114 Cellulitis of left upper limb Modifier: quent encounte Quantity: 1 r Electronic Signature(s) Signed: 10/09/2016 5:33:43 PM By: Baltazar Najjar MD Entered By: Baltazar Najjar on 10/09/2016 12:18:51

## 2016-10-11 NOTE — Progress Notes (Signed)
Carolyn, [name] ([id number]) Visit Report for 10/09/2016 Arrival Information Details Patient Name: [name], [name]. Date of Service: 10/09/2016 10:15 AM Medical Record Patient Account Number: [id number] Number: Treating RN: Curtis Sites 1965-09-18 (51 y.o. Other Clinician: Date of Birth/Sex: Female) Treating ROBSON, MICHAEL Primary Care Milaina Sher: Einar Crow Sharon Stapel/Extender: G Referring Jaleisa Brose: Ardis Rowan in Treatment: 3 Visit Information History Since Last Visit Added or deleted any medications: No Patient Arrived: Wheel Chair Any new allergies or adverse reactions: No Arrival Time: 10:36 Had a fall or experienced change in No activities of daily living that may affect Accompanied By: staff risk of falls: Transfer Assistance: None Signs or symptoms of abuse/neglect since last No Patient Identification Verified: Yes visito Secondary Verification Process Yes Hospitalized since last visit: No Completed: Has Dressing in Place as Prescribed: Yes Patient Has Alerts: Yes Pain Present Now: No Electronic Signature(s) Signed: 10/10/2016 4:38:09 PM By: Curtis Sites Entered By: Curtis Sites on 10/09/2016 10:37:21 [name] ([id number]) -------------------------------------------------------------------------------- Clinic Level of Care Assessment Details Patient Name: [name]. Date of Service: 10/09/2016 10:15 AM Medical Record Patient Account Number: [id number] Number: Treating RN: Huel Coventry [birth year] (51 y.o. Other Clinician: Date of Birth/Sex: Female) Treating ROBSON, MICHAEL Primary Care Harlan Vinal: Einar Crow Marlette Curvin/Extender: G Referring Squire Withey: Ardis Rowan in Treatment: 3 Clinic Level of Care Assessment Items TOOL 4 Quantity Score  - Use when only an EandM is performed on FOLLOW-UP visit 0 ASSESSMENTS - Nursing Assessment / Reassessment  - Reassessment of Co-morbidities  (includes updates in patient status) 0 X - Reassessment of Adherence to Treatment Plan 1 5 ASSESSMENTS - Wound and Skin Assessment / Reassessment X - Simple Wound Assessment / Reassessment - one wound 1 5  - Complex Wound Assessment / Reassessment - multiple wounds 0  - Dermatologic / Skin Assessment (not related to wound area) 0 ASSESSMENTS - Focused Assessment  - Circumferential Edema Measurements - multi extremities 0  - Nutritional Assessment / Counseling / Intervention 0  - Lower Extremity Assessment (monofilament, tuning fork, pulses) 0  - Peripheral Arterial Disease Assessment (using hand held doppler) 0 ASSESSMENTS - Ostomy and/or Continence Assessment and Care  - Incontinence Assessment and Management 0  - Ostomy Care Assessment and Management (repouching, etc.) 0 PROCESS - Coordination of Care X - Simple Patient / Family Education for ongoing care 1 15  - Complex (extensive) Patient / Family Education for ongoing care 0  - Staff obtains Chiropractor, Records, Test Results / Process Orders 0  - Staff telephones HHA, Nursing Homes / Clarify orders / etc 0 Duzan, Anzley K. ([id number])  - Routine Transfer to another Facility (non-emergent condition) 0  - Routine Hospital Admission (non-emergent condition) 0  - New Admissions / Manufacturing engineer / Ordering NPWT, Apligraf, etc. 0  - Emergency Hospital Admission (emergent condition) 0 X - Simple Discharge Coordination 1 10  - Complex (extensive) Discharge Coordination 0 PROCESS - Special Needs  - Pediatric / Minor Patient Management 0  - Isolation Patient Management 0  - Hearing / Language / Visual special needs 0  - Assessment of Community assistance (transportation, D/C planning, etc.) 0  - Additional assistance / Altered mentation 0  - Support Surface(s) Assessment (bed, cushion, seat, etc.) 0 INTERVENTIONS - Wound Cleansing / Measurement X - Simple Wound Cleansing - one wound 1  5  - Complex Wound Cleansing - multiple wounds 0 X - Wound Imaging (photographs - any number of wounds) 1 5  - Wound Tracing (instead of  photographs) 0 X - Simple Wound Measurement - one wound 1 5  - Complex Wound Measurement - multiple wounds 0 INTERVENTIONS - Wound Dressings X - Small Wound Dressing one or multiple wounds 1 10  - Medium Wound Dressing one or multiple wounds 0  - Large Wound Dressing one or multiple wounds 0  - Application of Medications - topical 0  - Application of Medications - injection 0 Wellborn, Briaunna K. ([id number]) INTERVENTIONS - Miscellaneous  - External ear exam 0  - Specimen Collection (cultures, biopsies, blood, body fluids, etc.) 0  - Specimen(s) / Culture(s) sent or taken to Lab for analysis 0  - Patient Transfer (multiple staff / Nurse, adult / Similar devices) 0  - Simple Staple / Suture removal (25 or less) 0  - Complex Staple / Suture removal (26 or more) 0  - Hypo / Hyperglycemic Management (close monitor of Blood Glucose) 0  - Ankle / Brachial Index (ABI) - do not check if billed separately 0 X - Vital Signs 1 5 Has the patient been seen at the hospital within the last three years: Yes Total Score: 65 Level Of Care: New/Established - Level 2 Electronic Signature(s) Signed: 10/09/2016 6:22:17 PM By: Elliot Gurney, BSN, RN, CWS, Kim RN, BSN Entered By: Elliot Gurney, BSN, RN, CWS, Kim on 10/09/2016 11:06:15 [name] ([id number]) -------------------------------------------------------------------------------- Encounter Discharge Information Details Patient Name: [name]. Date of Service: 10/09/2016 10:15 AM Medical Record Patient Account Number: [id number] Number: Treating RN: Huel Coventry 1965-01-25 (51 y.o. Other Clinician: Date of Birth/Sex: Female) Treating ROBSON, MICHAEL Primary Care Laddie Naeem: Einar Crow Loren Vicens/Extender: G Referring Clint Strupp: Ardis Rowan in Treatment:  3 Encounter Discharge Information Items Discharge Pain Level: 0 Discharge Condition: Stable Ambulatory Status: Wheelchair Discharge Destination: Home Transportation: Private Auto Accompanied By: staff Schedule Follow-up Appointment: Yes Medication Reconciliation completed and provided to Patient/Care No Kaleyah Labreck: Provided on Clinical Summary of Care: 10/09/2016 Form Type Recipient Paper Patient MB Electronic Signature(s) Signed: 10/10/2016 4:38:09 PM By: Curtis Sites Entered By: Curtis Sites on 10/09/2016 17:15:56 Pajak, [name] ([id number]) -------------------------------------------------------------------------------- Multi Wound Chart Details Patient Name: [name]. Date of Service: 10/09/2016 10:15 AM Medical Record Patient Account Number: [id number] Number: Treating RN: Curtis Sites December 28, 1965 (50 y.o. Other Clinician: Date of Birth/Sex: Female) Treating ROBSON, MICHAEL Primary Care Anthon Harpole: Einar Crow Malavika Lira/Extender: G Referring Karlin Heilman: Ardis Rowan in Treatment: 3 Vital Signs Height(in): Pulse(bpm): 71 Weight(lbs): Blood Pressure 137/87 (mmHg): Body Mass Index(BMI): Temperature(F): 97.7 Respiratory Rate 16 (breaths/min): Photos: [N/A:N/A] Wound Location: Left Hand - Web Between Left Hand - Dorsum N/A 1st and 2nd Digit Wounding Event: Trauma Trauma N/A Primary Etiology: Trauma, Other Trauma, Other N/A Comorbid History: Sleep Apnea, N/A N/A Quadriplegia Date Acquired: 08/26/2016 08/26/2016 N/A Weeks of Treatment: 3 3 N/A Wound Status: Open Healed - Epithelialized N/A Measurements L x W x D 1.1x0.7x0.2 0x0x0 N/A (cm) Area (cm) : 0.605 0 N/A Volume (cm) : 0.121 0 N/A % Reduction in Area: 48.60% 100.00% N/A % Reduction in Volume: -2.50% 100.00% N/A Classification: Full Thickness Without Partial Thickness N/A Exposed Support Structures Exudate Amount: Large N/A N/A Exudate Type: Sanguinous N/A  N/A Exudate Color: red N/A N/A Hackworth, Annitta K. ([id number]) Wound Margin: Flat and Intact N/A N/A Granulation Amount: Large (67-100%) N/A N/A Granulation Quality: Pink N/A N/A Necrotic Amount: None Present (0%) N/A N/A Exposed Structures: Fascia: No N/A N/A Fat Layer (Subcutaneous Tissue) Exposed: No Tendon: No Muscle: No Joint: No Bone: No Epithelialization: None N/A N/A Periwound Skin  Texture: Excoriation: No No Abnormalities Noted N/A Induration: No Callus: No Crepitus: No Rash: No Scarring: No Periwound Skin Maceration: No No Abnormalities Noted N/A Moisture: Dry/Scaly: No Periwound Skin Color: Erythema: Yes No Abnormalities Noted N/A Atrophie Blanche: No Cyanosis: No Ecchymosis: No Hemosiderin Staining: No Mottled: No Pallor: No Rubor: No Erythema Location: Circumferential N/A N/A Temperature: No Abnormality N/A N/A Tenderness on Yes No N/A Palpation: Wound Preparation: Ulcer Cleansing: N/A N/A Rinsed/Irrigated with Saline Topical Anesthetic Applied: None Treatment Notes Electronic Signature(s) Signed: 10/10/2016 4:38:09 PM By: Curtis Sites Entered By: Curtis Sites on 10/09/2016 17:14:51 [name] ([id number]) -------------------------------------------------------------------------------- Multi-Disciplinary Care Plan Details Patient Name: [name]. Date of Service: 10/09/2016 10:15 AM Medical Record Patient Account Number: [id number] Number: Treating RN: Curtis Sites April 24, 1965 (50 y.o. Other Clinician: Date of Birth/Sex: Female) Treating ROBSON, MICHAEL Primary Care Ajanee Buren: Einar Crow Luqman Perrelli/Extender: G Referring Wylene Weissman: Ardis Rowan in Treatment: 3 Active Inactive Electronic Signature(s) Signed: 10/10/2016 4:38:09 PM By: Curtis Sites Entered By: Curtis Sites on 10/09/2016 17:14:44 [name]  ([id number]) -------------------------------------------------------------------------------- Pain Assessment Details Patient Name: [name]. Date of Service: 10/09/2016 10:15 AM Medical Record Patient Account Number: [id number] Number: Treating RN: Curtis Sites Feb 26, 1965 (50 y.o. Other Clinician: Date of Birth/Sex: Female) Treating ROBSON, MICHAEL Primary Care Christopher Glasscock: Einar Crow Karlin Binion/Extender: G Referring Jaeley Wiker: Ardis Rowan in Treatment: 3 Active Problems Location of Pain Severity and Description of Pain Patient Has Paino Patient Unable to Respond Site Locations Pain Management and Medication Current Pain Management: Notes Topical or injectable lidocaine is offered to patient for acute pain when surgical debridement is performed. If needed, Patient is instructed to use over the counter pain medication for the following 24-48 hours after debridement. Wound care MDs do not prescribed pain medications. Patient has chronic pain or uncontrolled pain. Patient has been instructed to make an appointment with their Primary Care Physician for pain management Electronic Signature(s) Signed: 10/10/2016 4:38:09 PM By: Curtis Sites Entered By: Curtis Sites on 10/09/2016 10:37:39 [name] ([id number]) -------------------------------------------------------------------------------- Patient/Caregiver Education Details Patient Name: [name]. Date of Service: 10/09/2016 10:15 AM Medical Record Patient Account Number: [id number] Number: Treating RN: Curtis Sites [birth year] (50 y.o. Other Clinician: Date of Birth/Gender: Female) Treating ROBSON, MICHAEL Primary Care Physician: Einar Crow Physician/Extender: G Referring Physician: Ardis Rowan in Treatment: 3 Education Assessment Education Provided To: Caregiver Education Topics Provided Wound/Skin Impairment: Handouts: Other: wound care  and protect hands Methods: Demonstration, Explain/Verbal Responses: State content correctly Electronic Signature(s) Signed: 10/10/2016 4:38:09 PM By: Curtis Sites Entered By: Curtis Sites on 10/09/2016 17:16:15 [name] ([id number]) -------------------------------------------------------------------------------- Wound Assessment Details Patient Name: [name]. Date of Service: 10/09/2016 10:15 AM Medical Record Patient Account Number: [id number] Number: Treating RN: Curtis Sites 03-Feb-1965 (50 y.o. Other Clinician: Date of Birth/Sex: Female) Treating ROBSON, MICHAEL Primary Care Jaydee Ingman: Einar Crow Rex Oesterle/Extender: G Referring Marcellius Montagna: Ardis Rowan in Treatment: 3 Wound Status Wound Number: 2 Primary Etiology: Trauma, Other Wound Location: Left Hand - Web Between 1st and Wound Status: Open 2nd Digit Comorbid History: Sleep Apnea, Quadriplegia Wounding Event: Trauma Date Acquired: 08/26/2016 Weeks Of Treatment: 3 Clustered Wound: No Photos Photo Uploaded By: Curtis Sites on 10/09/2016 16:43:26 Wound Measurements Length: (cm) 1.1 Width: (cm) 0.7 Depth: (cm) 0.2 Area: (cm) 0.605 Volume: (cm) 0.121 % Reduction in Area: 48.6% % Reduction in Volume: -2.5% Epithelialization: None Tunneling: No Undermining: No Wound Description Full Thickness Without Exposed Classification: Support Structures Wound Margin: Flat and Intact  Exudate Large Amount: Exudate Type: Sanguinous Exudate Color: red Foul Odor After Cleansing: No Slough/Fibrino Yes Wound Bed Scheier, Sheri K. ([id number]) Granulation Amount: Large (67-100%) Exposed Structure Granulation Quality: Pink Fascia Exposed: No Necrotic Amount: None Present (0%) Fat Layer (Subcutaneous Tissue) Exposed: No Tendon Exposed: No Muscle Exposed: No Joint Exposed: No Bone Exposed: No Periwound Skin Texture Texture Color No Abnormalities Noted: No No  Abnormalities Noted: No Callus: No Atrophie Blanche: No Crepitus: No Cyanosis: No Excoriation: No Ecchymosis: No Induration: No Erythema: Yes Rash: No Erythema Location: Circumferential Scarring: No Hemosiderin Staining: No Mottled: No Moisture Pallor: No No Abnormalities Noted: No Rubor: No Dry / Scaly: No Maceration: No Temperature / Pain Temperature: No Abnormality Tenderness on Palpation: Yes Wound Preparation Ulcer Cleansing: Rinsed/Irrigated with Saline Topical Anesthetic Applied: None Treatment Notes Wound #2 (Left Hand - Web Between 1st and 2nd Digit) 1. Cleansed with: Clean wound with Normal Saline 4. Dressing Applied: Aquacel Ag 5. Secondary Dressing Applied Gauze and Kerlix/Conform 7. Secured with Tape Notes secured lightly with coban, caregiver applied patient's gloves and was educated to keep them on patient at all times Electronic Signature(s) Signed: 10/10/2016 4:38:09 PM By: Curtis Sites Entered By: Curtis Sites on 10/09/2016 10:47:05 [name] ([id number]) Pickering, [name] ([id number]) -------------------------------------------------------------------------------- Wound Assessment Details Patient Name: [name]. Date of Service: 10/09/2016 10:15 AM Medical Record Patient Account Number: [id number] Number: Treating RN: Curtis Sites 23-May-1965 (50 y.o. Other Clinician: Date of Birth/Sex: Female) Treating ROBSON, MICHAEL Primary Care Signora Zucco: Einar Crow Bradleigh Sonnen/Extender: G Referring Tevin Shillingford: Ardis Rowan in Treatment: 3 Wound Status Wound Number: 3 Primary Etiology: Trauma, Other Wound Location: Left Hand - Dorsum Wound Status: Healed - Epithelialized Wounding Event: Trauma Date Acquired: 08/26/2016 Weeks Of Treatment: 3 Clustered Wound: No Photos Photo Uploaded By: Curtis Sites on 10/09/2016 16:43:54 Wound Measurements Length: (cm) 0 % Reduction in Width: (cm) 0 % Reduction  in Depth: (cm) 0 Area: (cm) 0 Volume: (cm) 0 Area: 100% Volume: 100% Wound Description Classification: Partial Thickness Periwound Skin Texture Texture Color No Abnormalities Noted: No No Abnormalities Noted: No Moisture No Abnormalities Noted: No Electronic Signature(s) CHAKA, BOYSON ([id number]) Signed: 10/10/2016 4:38:09 PM By: Curtis Sites Entered By: Curtis Sites on 10/09/2016 10:46:37 [name] ([id number]) -------------------------------------------------------------------------------- Vitals Details Patient Name: [name]. Date of Service: 10/09/2016 10:15 AM Medical Record Patient Account Number: [id number] Number: Treating RN: Curtis Sites [birth year] (50 y.o. Other Clinician: Date of Birth/Sex: Female) Treating ROBSON, MICHAEL Primary Care Reta Norgren: Einar Crow Blanchard Willhite/Extender: G Referring Theoplis Garciagarcia: Ardis Rowan in Treatment: 3 Vital Signs Time Taken: 10:40 Temperature (F): 97.7 Pulse (bpm): 71 Respiratory Rate (breaths/min): 16 Blood Pressure (mmHg): 137/87 Reference Range: 80 - 120 mg / dl Electronic Signature(s) Signed: 10/10/2016 4:38:09 PM By: Curtis Sites Entered By: Curtis Sites on 10/09/2016 10:40:52

## 2016-10-15 ENCOUNTER — Encounter: Payer: Medicare Other | Admitting: Internal Medicine

## 2016-10-15 DIAGNOSIS — S60572D Other superficial bite of hand of left hand, subsequent encounter: Secondary | ICD-10-CM | POA: Diagnosis not present

## 2016-10-16 NOTE — Progress Notes (Signed)
[name], [name] ([id number]) Visit Report for 10/15/2016 Arrival Information Details Patient Name: [name], [name]. Date of Service: 10/15/2016 2:45 PM Medical Record Patient Account Number: [id number] Number: Treating RN: Huel Coventry 1965/05/23 (51 y.o. Other Clinician: Date of Birth/Sex: Female) Treating ROBSON, MICHAEL Primary Care Aboubacar Matsuo: Einar Crow Aziel Morgan/Extender: G Referring Aviyon Hocevar: Ardis Rowan in Treatment: 3 Visit Information History Since Last Visit Added or deleted any medications: No Patient Arrived: Ambulatory Any new allergies or adverse No Arrival Time: 14:50 reactions: Accompanied By: caregiver Had a fall or experienced change in No Transfer Assistance: None activities of daily living that may Patient Identification Verified: Yes affect Secondary Verification Process Yes risk of falls: Completed: Signs or symptoms of abuse/neglect No Patient Has Alerts: Yes since last visito Hospitalized since last visit: No Has Dressing in Place as Yes Prescribed: Pain Present Now: Unable to Respond Notes patient remains in wheelchair during her visit Electronic Signature(s) Signed: 10/15/2016 4:48:33 PM By: Elliot Gurney, BSN, RN, CWS, Kim RN, BSN Entered By: Elliot Gurney, BSN, RN, CWS, Kim on 10/15/2016 14:51:04 [name] ([id number]) -------------------------------------------------------------------------------- Clinic Level of Care Assessment Details Patient Name: [name], [name]. Date of Service: 10/15/2016 2:45 PM Medical Record Patient Account Number: [id number] Number: Treating RN: Huel Coventry Jul 28, 1965 (51 y.o. Other Clinician: Date of Birth/Sex: Female) Treating ROBSON, MICHAEL Primary Care Jacquelynne Guedes: Einar Crow Rumaldo Difatta/Extender: G Referring Jozeph Persing: Ardis Rowan in Treatment: 3 Clinic Level of Care Assessment Items TOOL 4 Quantity Score  - Use when only an EandM is performed on  FOLLOW-UP visit 0 ASSESSMENTS - Nursing Assessment / Reassessment  - Reassessment of Co-morbidities (includes updates in patient status) 0 X - Reassessment of Adherence to Treatment Plan 1 5 ASSESSMENTS - Wound and Skin Assessment / Reassessment X - Simple Wound Assessment / Reassessment - one wound 1 5  - Complex Wound Assessment / Reassessment - multiple wounds 0  - Dermatologic / Skin Assessment (not related to wound area) 0 ASSESSMENTS - Focused Assessment  - Circumferential Edema Measurements - multi extremities 0  - Nutritional Assessment / Counseling / Intervention 0  - Lower Extremity Assessment (monofilament, tuning fork, pulses) 0  - Peripheral Arterial Disease Assessment (using hand held doppler) 0 ASSESSMENTS - Ostomy and/or Continence Assessment and Care  - Incontinence Assessment and Management 0  - Ostomy Care Assessment and Management (repouching, etc.) 0 PROCESS - Coordination of Care X - Simple Patient / Family Education for ongoing care 1 15  - Complex (extensive) Patient / Family Education for ongoing care 0  - Staff obtains Chiropractor, Records, Test Results / Process Orders 0  - Staff telephones HHA, Nursing Homes / Clarify orders / etc 0 Cooley, Mia K. ([id number])  - Routine Transfer to another Facility (non-emergent condition) 0  - Routine Hospital Admission (non-emergent condition) 0  - New Admissions / Manufacturing engineer / Ordering NPWT, Apligraf, etc. 0  - Emergency Hospital Admission (emergent condition) 0 X - Simple Discharge Coordination 1 10  - Complex (extensive) Discharge Coordination 0 PROCESS - Special Needs  - Pediatric / Minor Patient Management 0  - Isolation Patient Management 0  - Hearing / Language / Visual special needs 0  - Assessment of Community assistance (transportation, D/C planning, etc.) 0  - Additional assistance / Altered mentation 0  - Support Surface(s) Assessment (bed,  cushion, seat, etc.) 0 INTERVENTIONS - Wound Cleansing / Measurement X - Simple Wound Cleansing - one wound 1 5  - Complex Wound Cleansing - multiple wounds 0 X -  Wound Imaging (photographs - any number of wounds) 1 5  - Wound Tracing (instead of photographs) 0 X - Simple Wound Measurement - one wound 1 5  - Complex Wound Measurement - multiple wounds 0 INTERVENTIONS - Wound Dressings  - Small Wound Dressing one or multiple wounds 0  - Medium Wound Dressing one or multiple wounds 0  - Large Wound Dressing one or multiple wounds 0  - Application of Medications - topical 0  - Application of Medications - injection 0 Hedman, Kadence K. ([id number]) INTERVENTIONS - Miscellaneous  - External ear exam 0  - Specimen Collection (cultures, biopsies, blood, body fluids, etc.) 0  - Specimen(s) / Culture(s) sent or taken to Lab for analysis 0  - Patient Transfer (multiple staff / Nurse, adult / Similar devices) 0  - Simple Staple / Suture removal (25 or less) 0  - Complex Staple / Suture removal (26 or more) 0  - Hypo / Hyperglycemic Management (close monitor of Blood Glucose) 0  - Ankle / Brachial Index (ABI) - do not check if billed separately 0 X - Vital Signs 1 5 Has the patient been seen at the hospital within the last three years: Yes Total Score: 55 Level Of Care: New/Established - Level 2 Electronic Signature(s) Signed: 10/15/2016 4:48:33 PM By: Elliot Gurney, BSN, RN, CWS, Kim RN, BSN Entered By: Elliot Gurney, BSN, RN, CWS, Kim on 10/15/2016 15:11:11 [name] ([id number]) -------------------------------------------------------------------------------- Encounter Discharge Information Details Patient Name: [name]. Date of Service: 10/15/2016 2:45 PM Medical Record Patient Account Number: [id number] Number: Treating RN: Huel Coventry 12/17/1965 (50 y.o. Other Clinician: Date of Birth/Sex: Female) Treating ROBSON, MICHAEL Primary Care Leena Tiede:  Einar Crow Maher Shon/Extender: G Referring Ellarae Nevitt: Ardis Rowan in Treatment: 3 Encounter Discharge Information Items Discharge Pain Level: 0 Discharge Condition: Stable Ambulatory Status: Wheelchair Discharge Destination: Home Transportation: Private Auto Accompanied By: caregiver Schedule Follow-up Appointment: Yes Medication Reconciliation completed and provided to Patient/Care Yes Birch Farino: Provided on Clinical Summary of Care: 10/15/2016 Form Type Recipient Paper Patient MB Electronic Signature(s) Signed: 10/15/2016 4:48:33 PM By: Elliot Gurney, BSN, RN, CWS, Kim RN, BSN Entered By: Elliot Gurney, BSN, RN, CWS, Kim on 10/15/2016 15:11:44 [name] ([id number]) -------------------------------------------------------------------------------- General Visit Notes Details Patient Name: [name]. Date of Service: 10/15/2016 2:45 PM Medical Record Patient Account Number: [id number] Number: Treating RN: Huel Coventry [birth year] (50 y.o. Other Clinician: Date of Birth/Sex: Female) Treating ROBSON, MICHAEL Primary Care Mauro Arps: Einar Crow Encarnacion Scioneaux/Extender: G Referring Ollie Esty: Ardis Rowan in Treatment: 3 Notes Patient's wounds are healed. Patient to wear gloves to protect her hand from bites. Gloves can be removed for bathing and meals. Electronic Signature(s) Signed: 10/15/2016 3:17:39 PM By: Elliot Gurney, BSN, RN, CWS, Kim RN, BSN Entered By: Elliot Gurney, BSN, RN, CWS, Kim on 10/15/2016 15:17:38 [name] ([id number]) -------------------------------------------------------------------------------- Multi Wound Chart Details Patient Name: [name]. Date of Service: 10/15/2016 2:45 PM Medical Record Patient Account Number: [id number] Number: Treating RN: Huel Coventry November 20, 1965 (50 y.o. Other Clinician: Date of Birth/Sex: Female) Treating ROBSON, MICHAEL Primary Care Fatisha Rabalais: Einar Crow Zachrey Deutscher/Extender: G Referring Akiba Melfi: Ardis Rowan in Treatment: 3 Vital Signs Height(in): Pulse(bpm): 100 Weight(lbs): Blood Pressure 131/110 (mmHg): Body Mass Index(BMI): Temperature(F): 97.8 Respiratory Rate 16 (breaths/min): Photos: [N/A:N/A] Wound Location: Left Hand - Web Between N/A N/A 1st and 2nd Digit Wounding Event: Trauma N/A N/A Primary Etiology: Trauma, Other N/A N/A Comorbid History: Sleep Apnea, N/A N/A Quadriplegia Date Acquired: 08/26/2016 N/A N/A Weeks of Treatment: 3  N/A N/A Wound Status: Healed - Epithelialized N/A N/A Measurements L x W x D 0x0x0 N/A N/A (cm) Area (cm) : 0 N/A N/A Volume (cm) : 0 N/A N/A % Reduction in Area: 100.00% N/A N/A % Reduction in Volume: 100.00% N/A N/A Classification: Full Thickness Without N/A N/A Exposed Support Structures Exudate Amount: Large N/A N/A Exudate Type: Sanguinous N/A N/A Exudate Color: red N/A N/A Wound Margin: Flat and Intact N/A N/A [name], [name]. ([id number]) Granulation Amount: None Present (0%) N/A N/A Necrotic Amount: None Present (0%) N/A N/A Exposed Structures: Fascia: No N/A N/A Fat Layer (Subcutaneous Tissue) Exposed: No Tendon: No Muscle: No Joint: No Bone: No Limited to Skin Breakdown Epithelialization: Large (67-100%) N/A N/A Periwound Skin Texture: Excoriation: No N/A N/A Induration: No Callus: No Crepitus: No Rash: No Scarring: No Periwound Skin Maceration: No N/A N/A Moisture: Dry/Scaly: No Periwound Skin Color: Erythema: Yes N/A N/A Atrophie Blanche: No Cyanosis: No Ecchymosis: No Hemosiderin Staining: No Mottled: No Pallor: No Rubor: No Erythema Location: Circumferential N/A N/A Temperature: No Abnormality N/A N/A Tenderness on Yes N/A N/A Palpation: Wound Preparation: Ulcer Cleansing: N/A N/A Rinsed/Irrigated with Saline Topical Anesthetic Applied: None Treatment Notes Electronic Signature(s) Signed: 10/15/2016 4:14:11 PM  By: Baltazar Najjar MD Entered By: Baltazar Najjar on 10/15/2016 15:11:57 [name] ([id number]) -------------------------------------------------------------------------------- Multi-Disciplinary Care Plan Details Patient Name: [name]. Date of Service: 10/15/2016 2:45 PM Medical Record Patient Account Number: [id number] Number: Treating RN: Huel Coventry 23-Aug-1965 (50 y.o. Other Clinician: Date of Birth/Sex: Female) Treating ROBSON, MICHAEL Primary Care Irmgard Rampersaud: Einar Crow Lawayne Hartig/Extender: G Referring Rhianon Zabawa: Ardis Rowan in Treatment: 3 Active Inactive Electronic Signature(s) Signed: 10/15/2016 4:48:33 PM By: Elliot Gurney, BSN, RN, CWS, Kim RN, BSN Entered By: Elliot Gurney, BSN, RN, CWS, Kim on 10/15/2016 15:10:35 [name] ([id number]) -------------------------------------------------------------------------------- Pain Assessment Details Patient Name: [name], [name]. Date of Service: 10/15/2016 2:45 PM Medical Record Patient Account Number: [id number] Number: Treating RN: Huel Coventry 10-Jul-1965 (50 y.o. Other Clinician: Date of Birth/Sex: Female) Treating ROBSON, MICHAEL Primary Care Khristen Cheyney: Einar Crow Luken Shadowens/Extender: G Referring Cary Lothrop: Ardis Rowan in Treatment: 3 Active Problems Location of Pain Severity and Description of Pain Patient Has Paino Patient Unable to Respond Site Locations Pain Management and Medication Current Pain Management: Goals for Pain Management Topical or injectable lidocaine is offered to patient for acute pain when surgical debridement is performed. If needed, Patient is instructed to use over the counter pain medication for the following 24-48 hours after debridement. Wound care MDs do not prescribed pain medications. Patient has chronic pain or uncontrolled pain. Patient has been instructed to make an appointment with their Primary Care Physician for pain  management. Notes Patient does not appear to be in any pain. Electronic Signature(s) Signed: 10/15/2016 4:48:33 PM By: Elliot Gurney, BSN, RN, CWS, Kim RN, BSN Entered By: Elliot Gurney, BSN, RN, CWS, Kim on 10/15/2016 14:51:31 [name] ([id number]) -------------------------------------------------------------------------------- Patient/Caregiver Education Details Patient Name: [name] Date of Service: 10/15/2016 2:45 PM Medical Record Patient Account Number: [id number] Number: Treating RN: Huel Coventry 15-Jun-1965 (50 y.o. Other Clinician: Date of Birth/Gender: Female) Treating ROBSON, MICHAEL Primary Care Physician: Einar Crow Physician/Extender: G Referring Physician: Ardis Rowan in Treatment: 3 Education Assessment Education Provided To: Caregiver Education Topics Provided Electronic Signature(s) Signed: 10/15/2016 4:48:33 PM By: Elliot Gurney, BSN, RN, CWS, Kim RN, BSN Entered By: Elliot Gurney, BSN, RN, CWS, Kim on 10/15/2016 15:12:40 [name] ([id number]) -------------------------------------------------------------------------------- Wound Assessment Details Patient Name: [name], [name]. Date  of Service: 10/15/2016 2:45 PM Medical Record Patient Account Number: [id number] Number: Treating RN: Huel Coventry 14-Jun-1965 (50 y.o. Other Clinician: Date of Birth/Sex: Female) Treating ROBSON, MICHAEL Primary Care Shaela Boer: Einar Crow Yovana Scogin/Extender: G Referring Romie Tay: Ardis Rowan in Treatment: 3 Wound Status Wound Number: 2 Primary Etiology: Trauma, Other Wound Location: Left Hand - Web Between 1st and Wound Status: Healed - Epithelialized 2nd Digit Comorbid History: Sleep Apnea, Quadriplegia Wounding Event: Trauma Date Acquired: 08/26/2016 Weeks Of Treatment: 3 Clustered Wound: No Photos Wound Measurements Length: (cm) 0 % Reduction in A Width: (cm) 0 % Reduction in V Depth: (cm) 0 Epithelializatio Area:  (cm) 0 Tunneling: Volume: (cm) 0 Undermining: rea: 100% olume: 100% n: Large (67-100%) No No Wound Description Full Thickness Without Exposed Classification: Support Structures Wound Margin: Flat and Intact Exudate Large Amount: Exudate Type: Sanguinous Exudate Color: red Foul Odor After Cleansing: No Slough/Fibrino Yes Wound Bed Granulation Amount: None Present (0%) Exposed Structure Necrotic Amount: None Present (0%) Fascia Exposed: No Fat Layer (Subcutaneous Tissue) Exposed: No Huntsman, [name] ([id number]) Tendon Exposed: No Muscle Exposed: No Joint Exposed: No Bone Exposed: No Limited to Skin Breakdown Periwound Skin Texture Texture Color No Abnormalities Noted: No No Abnormalities Noted: No Callus: No Atrophie Blanche: No Crepitus: No Cyanosis: No Excoriation: No Ecchymosis: No Induration: No Erythema: Yes Rash: No Erythema Location: Circumferential Scarring: No Hemosiderin Staining: No Mottled: No Moisture Pallor: No No Abnormalities Noted: No Rubor: No Dry / Scaly: No Maceration: No Temperature / Pain Temperature: No Abnormality Tenderness on Palpation: Yes Wound Preparation Ulcer Cleansing: Rinsed/Irrigated with Saline Topical Anesthetic Applied: None Electronic Signature(s) Signed: 10/15/2016 4:48:33 PM By: Elliot Gurney, BSN, RN, CWS, Kim RN, BSN Entered By: Elliot Gurney, BSN, RN, CWS, Kim on 10/15/2016 15:09:38 [name] ([id number]) -------------------------------------------------------------------------------- Vitals Details Patient Name: [name]. Date of Service: 10/15/2016 2:45 PM Medical Record Patient Account Number: [id number] Number: Treating RN: Huel Coventry 09-25-1965 (50 y.o. Other Clinician: Date of Birth/Sex: Female) Treating ROBSON, MICHAEL Primary Care Farrah Skoda: Einar Crow Kymiah Araiza/Extender: G Referring Makylie Rivere: Ardis Rowan in Treatment: 3 Vital Signs Time Taken:  14:51 Temperature (F): 97.8 Pulse (bpm): 100 Respiratory Rate (breaths/min): 16 Blood Pressure (mmHg): 131/110 Reference Range: 80 - 120 mg / dl Notes Patient chanting, frowning and making loud noise during blood pressure. Electronic Signature(s) Signed: 10/15/2016 4:48:33 PM By: Elliot Gurney, BSN, RN, CWS, Kim RN, BSN Entered By: Elliot Gurney, BSN, RN, CWS, Kim on 10/15/2016 14:54:35

## 2016-10-16 NOTE — Progress Notes (Signed)
Carolyn, [name] ([id number]) Visit Report for 10/15/2016 HPI Details Patient Name: [name], [name]. Date of Service: 10/15/2016 2:45 PM Medical Record Patient Account Number: [id number] Number: Treating RN: [name] [birth year] (50 y.o. Other Clinician: Date of Birth/Sex: Female) Treating [name] Primary Care Provider: Einar [name] Provider/Extender: G Referring Provider: Ardis [name] in Treatment: 3 History of Present Illness HPI Description: 09/18/16; this is a 51 year old woman with severe mental retardation, cerebral palsy. She apparently has a long history of repetitive movements of her hands back and forth to her mouth predominantly involving the lateral aspect of the hands especially the inner phalangeal area between the thumb and first fingers bilaterally. She was recently put on Augmentin by primary care for concern about cellulitis. She lives at [facility]. She has a history of cerebral palsy, mental retardation, functional quadriplegia although she has reasonable use of the left greater than right arm. She also has seizure disorder 10/02/16; the facility has obtained her gloves for her hands this is really helped. There is nothing open on the right hand however in the webspace between her thumb and first finger purulent drainage noted by her intake nurse. Small probing wound. 10/09/16; culture I did last week showed methicillin sensitive staph aureus that should've been well covered by the Augmentin. I gave her. In the meantime the facility where she is seems to be concerned about the glove. They gave her last time is being some form of restraint which is an issue, I'm well familiar with. We have been using silver alginate to the wound in the webspace between her thumb and first finger. Still looking at the left hand in the webspace between the him and first finger. 10/15/16; wounds in the left first and webspace caused by  recurrent bite/leaking injury has resolved. There is no evidence of infection. The facility is obtained really nice gloves to protect her hands. These can be removed for bathing and at mealtimes with the patient is apparently able to participate in feeding. Electronic Signature(s) Signed: 10/15/2016 4:14:11 PM By: Carolyn Najjar MD Entered By: [name] on 10/15/2016 15:13:43 [name] ([id number]) -------------------------------------------------------------------------------- Physical Exam Details Patient Name: Carolyn, [name]. Date of Service: 10/15/2016 2:45 PM Medical Record Patient Account Number: [id number] Number: Treating RN: [name] 01-09-1966 (50 y.o. Other Clinician: Date of Birth/Sex: Female) Treating [name] Primary Care Provider: Einar [name] Provider/Extender: G Referring Provider: Ardis [name] in Treatment: 3 Constitutional Patient is hypertensive.. Pulse regular and within target range for patient.Marland Kitchen Respirations regular, non-labored and within target range.. Temperature is normal and within the target range for the patient.Marland Kitchen appears in no distress. As usual somewhat agitated. Notes Wound exam; left hand first web space between the thumb and first finger. Surface eschar removed there is no open wound no evidence of infection Electronic Signature(s) Signed: 10/15/2016 4:14:11 PM By: Carolyn Najjar MD Entered By: [name] on 10/15/2016 15:15:14 [name] ([id number]) -------------------------------------------------------------------------------- Physician Orders Details Patient Name: [name]. Date of Service: 10/15/2016 2:45 PM Medical Record Patient Account Number: [id number] Number: Treating RN: [name] [birth year] (50 y.o. Other Clinician: Date of Birth/Sex: Female) Treating [name] Primary Care Provider: Einar [name] Provider/Extender: G Referring  Provider: Ardis [name] in Treatment: 3 Verbal / Phone Orders: No Diagnosis Coding Discharge From [facility] Services o Discharge from Wound Care Center Electronic Signature(s) Signed: 10/15/2016 4:14:11 PM By: Carolyn Najjar MD Signed: 10/15/2016 4:48:33 PM By: [name], BSN, RN, CWS, Kim RN,  BSN Entered By: [name], BSN, RN, CWS, Kim on 10/15/2016 15:10:14 [name] ([id number]) -------------------------------------------------------------------------------- Problem List Details Patient Name: [name]. Date of Service: 10/15/2016 2:45 PM Medical Record Patient Account Number: [id number] Number: Treating RN: [name] 12-Jan-1966 (50 y.o. Other Clinician: Date of Birth/Sex: Female) Treating [name] Primary Care Provider: Einar [name] Provider/Extender: G Referring Provider: Ardis [name] in Treatment: 3 Active Problems ICD-10 Encounter Code Description Active Date Diagnosis S60.572D Other superficial bite of hand of left hand, subsequent 09/18/2016 Yes encounter S60.571D Other superficial bite of hand of right hand, subsequent 09/18/2016 Yes encounter L03.113 Cellulitis of right upper limb 09/18/2016 Yes L03.114 Cellulitis of left upper limb 09/18/2016 Yes Inactive Problems Resolved Problems Electronic Signature(s) Signed: 10/15/2016 4:14:11 PM By: Carolyn Najjar MD Entered By: [name] on 10/15/2016 15:11:50 [name] ([id number]) -------------------------------------------------------------------------------- Progress Note Details Patient Name: [name]. Date of Service: 10/15/2016 2:45 PM Medical Record Patient Account Number: [id number] Number: Treating RN: [name] 29-May-1965 (50 y.o. Other Clinician: Date of Birth/Sex: Female) Treating Hien Perreira Primary Care Provider: Einar [name] Provider/Extender: G Referring Provider: Ardis [name] in Treatment:  3 Subjective History of Present Illness (HPI) 09/18/16; this is a 51 year old woman with severe mental retardation, cerebral palsy. She apparently has a long history of repetitive movements of her hands back and forth to her mouth predominantly involving the lateral aspect of the hands especially the inner phalangeal area between the thumb and first fingers bilaterally. She was recently put on Augmentin by primary care for concern about cellulitis. She lives at [facility]. She has a history of cerebral palsy, mental retardation, functional quadriplegia although she has reasonable use of the left greater than right arm. She also has seizure disorder 10/02/16; the facility has obtained her gloves for her hands this is really helped. There is nothing open on the right hand however in the webspace between her thumb and first finger purulent drainage noted by her intake nurse. Small probing wound. 10/09/16; culture I did last week showed methicillin sensitive staph aureus that should've been well covered by the Augmentin. I gave her. In the meantime the facility where she is seems to be concerned about the glove. They gave her last time is being some form of restraint which is an issue, I'm well familiar with. We have been using silver alginate to the wound in the webspace between her thumb and first finger. Still looking at the left hand in the webspace between the him and first finger. 10/15/16; wounds in the left first and webspace caused by recurrent bite/leaking injury has resolved. There is no evidence of infection. The facility is obtained really nice gloves to protect her hands. These can be removed for bathing and at mealtimes with the patient is apparently able to participate in feeding. Objective Constitutional Patient is hypertensive.. Pulse regular and within target range for patient.Marland Kitchen Respirations regular, non-labored and within target range.. Temperature is normal and  within the target range for the patient.Marland Kitchen appears in no distress. As usual somewhat agitated. Vitals Time Taken: 2:51 PM, Temperature: 97.8 F, Pulse: 100 bpm, Respiratory Rate: 16 breaths/min, Blood Pressure: 131/110 mmHg. [name], [name] ([id number]) General Notes: Patient chanting, frowning and making loud noise during blood pressure. General Notes: Wound exam; left hand first web space between the thumb and first finger. Surface eschar removed there is no open wound no evidence of infection Integumentary (Hair, Skin) Wound #2 status is Healed - Epithelialized. Original cause  of wound was Trauma. The wound is located on the Left Hand - Web Between 1st and 2nd Digit. The wound measures 0cm length x 0cm width x 0cm depth; 0cm^2 area and 0cm^3 volume. The wound is limited to skin breakdown. There is no tunneling or undermining noted. There is a large amount of sanguinous drainage noted. The wound margin is flat and intact. There is no granulation within the wound bed. There is no necrotic tissue within the wound bed. The periwound skin appearance exhibited: Erythema. The periwound skin appearance did not exhibit: Callus, Crepitus, Excoriation, Induration, Rash, Scarring, Dry/Scaly, Maceration, Atrophie Blanche, Cyanosis, Ecchymosis, Hemosiderin Staining, Mottled, Pallor, Rubor. The surrounding wound skin color is noted with erythema which is circumferential. Periwound temperature was noted as No Abnormality. The periwound has tenderness on palpation. Assessment Active Problems ICD-10 S60.572D - Other superficial bite of hand of left hand, subsequent encounter S60.571D - Other superficial bite of hand of right hand, subsequent encounter L03.113 - Cellulitis of right upper limb L03.114 - Cellulitis of left upper limb Plan Discharge From [facility] Services: Discharge from Wound Care Center 1 the patient to be discharged from the wound care center Royal, Claris Che K. ([id number]) #2 I approve  of the use of gloves to protect this patient's hands and cases like this. The patient does not have the capacity to understand the ramifications of recurrent bite injuries to her hands. The gloves can be removed for bathing and meals where apparently the patient is able to participate in feeding herself #3 she can be discharged from the wound care center Electronic Signature(s) Signed: 10/15/2016 4:14:11 PM By: Carolyn Najjar MD Entered By: [name] on 10/15/2016 15:16:56 Corp, Elwyn [name] ([id number]) -------------------------------------------------------------------------------- SuperBill Details Patient Name: [name]. Date of Service: 10/15/2016 Medical Record Patient Account Number: [id number] Number: Treating RN: [name] [birth year] (50 y.o. Other Clinician: Date of Birth/Sex: Female) Treating Markanthony Gedney Primary Care Provider: Einar [name] Provider/Extender: G Referring Provider: Ardis [name] in Treatment: 3 Diagnosis Coding ICD-10 Codes Code Description [id number] Other superficial bite of hand of left hand, subsequent encounter S60.571D Other superficial bite of hand of right hand, subsequent encounter L03.113 Cellulitis of right upper limb L03.114 Cellulitis of left upper limb Facility Procedures CPT4 Code: [id number] Description: [phone] - WOUND CARE VISIT-LEV 2 EST PT Modifier: Quantity: 1 Physician Procedures CPT4 Code Description: [id number] - WC PHYS LEVEL 2 - EST PT ICD-10 Description Diagnosis S60.572D Other superficial bite of hand of left hand, subse L03.114 Cellulitis of left upper limb Modifier: quent encounte Quantity: 1 r Electronic Signature(s) Signed: 10/15/2016 4:14:11 PM By: Carolyn Najjar MD Entered By: [name] on 10/15/2016 15:19:00

## 2016-10-30 ENCOUNTER — Encounter: Payer: Medicare Other | Attending: Internal Medicine | Admitting: Internal Medicine

## 2016-10-30 DIAGNOSIS — G40909 Epilepsy, unspecified, not intractable, without status epilepticus: Secondary | ICD-10-CM | POA: Diagnosis not present

## 2016-10-30 DIAGNOSIS — F72 Severe intellectual disabilities: Secondary | ICD-10-CM | POA: Insufficient documentation

## 2016-10-30 DIAGNOSIS — G809 Cerebral palsy, unspecified: Secondary | ICD-10-CM | POA: Diagnosis not present

## 2016-10-30 DIAGNOSIS — S60571D Other superficial bite of hand of right hand, subsequent encounter: Secondary | ICD-10-CM | POA: Insufficient documentation

## 2016-10-30 DIAGNOSIS — X58XXXD Exposure to other specified factors, subsequent encounter: Secondary | ICD-10-CM | POA: Insufficient documentation

## 2016-11-03 NOTE — Progress Notes (Signed)
GORGEOUS, NEWLUN ([id number]) Visit Report for 10/30/2016 HPI Details Patient Name: [name], [name]. Date of Service: 10/30/2016 1:45 PM Medical Record Patient Account Number: [id number] Number: Treating RN: Huel Coventry 1965/02/13 (51 y.o. Other Clinician: Date of Birth/Sex: Female) Treating Marri Mcneff Primary Care Provider: Einar Crow Provider/Extender: G Referring Provider: Ardis Rowan in Treatment: 6 History of Present Illness HPI Description: 09/18/16; this is a 51 year old woman with severe mental retardation, cerebral palsy. She apparently has a long history of repetitive movements of her hands back and forth to her mouth predominantly involving the lateral aspect of the hands especially the inner phalangeal area between the thumb and first fingers bilaterally. She was recently put on Augmentin by primary care for concern about cellulitis. She lives at [facility]. She has a history of cerebral palsy, mental retardation, functional quadriplegia although she has reasonable use of the left greater than right arm. She also has seizure disorder 10/02/16; the facility has obtained her gloves for her hands this is really helped. There is nothing open on the right hand however in the webspace between her thumb and first finger purulent drainage noted by her intake nurse. Small probing wound. 10/09/16; culture I did last week showed methicillin sensitive staph aureus that should've been well covered by the Augmentin. I gave her. In the meantime the facility where she is seems to be concerned about the glove. They gave her last time is being some form of restraint which is an issue, I'm well familiar with. We have been using silver alginate to the wound in the webspace between her thumb and first finger. Still looking at the left hand in the webspace between the him and first finger. 10/15/16; wounds in the left first and webspace caused by  recurrent bite/leaking injury has resolved. There is no evidence of infection. The facility is obtained really nice gloves to protect her hands. These can be removed for bathing and at mealtimes with the patient is apparently able to participate in feeding. 10/30/16; apparently the last time she was here there was a wound on her right lateral hand however we did not look at this underneath her glove. She arrived today in clinic with wraps very tightly around both hands. Electronic Signature(s) Signed: 10/30/2016 4:12:03 PM By: Baltazar Najjar MD Entered By: Baltazar Najjar on 10/30/2016 14:57:53 [name] ([id number]) -------------------------------------------------------------------------------- Physical Exam Details Patient Name: [name], [name]. Date of Service: 10/30/2016 1:45 PM Medical Record Patient Account Number: [id number] Number: Treating RN: Huel Coventry Jul 28, 1965 (51 y.o. Other Clinician: Date of Birth/Sex: Female) Treating Elanora Quin Primary Care Provider: Einar Crow Provider/Extender: G Referring Provider: Ardis Rowan in Treatment: 6 Notes Wound exam; the left hand first web space still remains closed oOn the right she has a fairly benign looking wound on the right lateral hand mostly dorsally. She had a blister between the thumb and the first finger in the web space of the right hand which was opened with skin removal in the clinic today. Basically this appears healthy. Electronic Signature(s) Signed: 10/30/2016 4:12:03 PM By: Baltazar Najjar MD Entered By: Baltazar Najjar on 10/30/2016 14:59:16 [name] ([id number]) -------------------------------------------------------------------------------- Physician Orders Details Patient Name: [name], [name]. Date of Service: 10/30/2016 1:45 PM Medical Record Patient Account Number: [id number] Number: Treating RN: Huel Coventry 08-31-1965 (51 y.o. Other  Clinician: Date of Birth/Sex: Female) Treating Tzirel Leonor Primary Care Provider: Einar Crow Provider/Extender: G Referring Provider: Ardis Rowan in Treatment: 6 Verbal /  Phone Orders: No Diagnosis Coding Wound Cleansing Wound #1R Right Hand - Web Between 1st and 2nd Digit o Clean wound with Normal Saline. o May Shower, gently pat wound dry prior to applying new dressing. Wound #4 Right,Lateral Hand - Dorsum o Clean wound with Normal Saline. o May Shower, gently pat wound dry prior to applying new dressing. Primary Wound Dressing Wound #1R Right Hand - Web Between 1st and 2nd Digit o Silvercel Non-Adherent - or equal Wound #4 Right,Lateral Hand - Dorsum o Silvercel Non-Adherent - or equal Secondary Dressing Wound #1R Right Hand - Web Between 1st and 2nd Digit o Coban - wrapped lightly Wound #4 Right,Lateral Hand - Dorsum o Coban - wrapped lightly Dressing Change Frequency Wound #1R Right Hand - Web Between 1st and 2nd Digit o Change dressing every other day. o Other: - Wear mittens to prevent biting of hands. Wound #4 Right,Lateral Hand - Dorsum o Change dressing every other day. o Other: - Wear mittens to prevent biting of hands. Follow-up Appointments Wound #1R Right Hand - Web Between 1st and 2nd Digit KANESHA, CADLE. ([id number]) o Return Appointment in 1 week. Wound #4 Right,Lateral Hand - Dorsum o Return Appointment in 1 week. Electronic Signature(s) Signed: 10/30/2016 4:12:03 PM By: Baltazar Najjar MD Signed: 11/01/2016 4:29:15 PM By: Elliot Gurney, BSN, RN, CWS, Kim RN, BSN Entered By: Elliot Gurney, BSN, RN, CWS, Kim on 10/30/2016 14:28:16 [name] ([id number]) -------------------------------------------------------------------------------- Problem List Details Patient Name: [name], [name]. Date of Service: 10/30/2016 1:45 PM Medical Record Patient Account Number: [id number] Number: Treating RN:  Huel Coventry 03/23/1965 (51 y.o. Other Clinician: Date of Birth/Sex: Female) Treating Abisai Coble Primary Care Provider: Einar Crow Provider/Extender: G Referring Provider: Ardis Rowan in Treatment: 6 Active Problems ICD-10 Encounter Code Description Active Date Diagnosis S60.571D Other superficial bite of hand of right hand, subsequent 09/18/2016 Yes encounter S60.571D Other superficial bite of hand of right hand, subsequent 10/30/2016 Yes encounter Inactive Problems ICD-10 Code Description Active Date Inactive Date S60.572D Other superficial bite of hand of left hand, subsequent 09/18/2016 09/18/2016 encounter L03.113 Cellulitis of right upper limb 09/18/2016 09/18/2016 L03.114 Cellulitis of left upper limb 09/18/2016 09/18/2016 Resolved Problems Electronic Signature(s) Signed: 10/30/2016 4:12:03 PM By: Baltazar Najjar MD Entered By: Baltazar Najjar on 10/30/2016 14:56:52 [name], [name] ([id number]) -------------------------------------------------------------------------------- Progress Note Details Patient Name: [name]. Date of Service: 10/30/2016 1:45 PM Medical Record Patient Account Number: [id number] Number: Treating RN: Huel Coventry [birth year] (51 y.o. Other Clinician: Date of Birth/Sex: Female) Treating Eloise Picone Primary Care Provider: Einar Crow Provider/Extender: G Referring Provider: Ardis Rowan in Treatment: 6 Subjective History of Present Illness (HPI) 09/18/16; this is a 51 year old woman with severe mental retardation, cerebral palsy. She apparently has a long history of repetitive movements of her hands back and forth to her mouth predominantly involving the lateral aspect of the hands especially the inner phalangeal area between the thumb and first fingers bilaterally. She was recently put on Augmentin by primary care for concern about cellulitis. She lives at [facility]. She  has a history of cerebral palsy, mental retardation, functional quadriplegia although she has reasonable use of the left greater than right arm. She also has seizure disorder 10/02/16; the facility has obtained her gloves for her hands this is really helped. There is nothing open on the right hand however in the webspace between her thumb and first finger purulent drainage noted by her intake nurse. Small probing wound. 10/09/16; culture I did  last week showed methicillin sensitive staph aureus that should've been well covered by the Augmentin. I gave her. In the meantime the facility where she is seems to be concerned about the glove. They gave her last time is being some form of restraint which is an issue, I'm well familiar with. We have been using silver alginate to the wound in the webspace between her thumb and first finger. Still looking at the left hand in the webspace between the him and first finger. 10/15/16; wounds in the left first and webspace caused by recurrent bite/leaking injury has resolved. There is no evidence of infection. The facility is obtained really nice gloves to protect her hands. These can be removed for bathing and at mealtimes with the patient is apparently able to participate in feeding. 10/30/16; apparently the last time she was here there was a wound on her right lateral hand however we did not look at this underneath her glove. She arrived today in clinic with wraps very tightly around both hands. Objective Integumentary (Hair, Skin) Wound #1R status is Open. Original cause of wound was Trauma. The wound is located on the Right Hand - Web Between 1st and 2nd Digit. The wound measures 1cm length x 2.5cm width x 0.1cm depth; 1.963cm^2 area and 0.196cm^3 volume. The wound is limited to skin breakdown. There is no tunneling or undermining [name], [name] ([id number]) noted. There is a medium amount of serous drainage noted. The wound margin is flat and intact.  There is no granulation within the wound bed. There is no necrotic tissue within the wound bed. The periwound skin appearance exhibited: Maceration. Wound #4 status is Open. Original cause of wound was Bite. The wound is located on the Right,Lateral Hand - Dorsum. The wound measures 1.5cm length x 0.7cm width x 0.1cm depth; 0.825cm^2 area and 0.082cm^3 volume. There is Fat Layer (Subcutaneous Tissue) Exposed exposed. There is no tunneling or undermining noted. There is a medium amount of serosanguineous drainage noted. The wound margin is flat and intact. There is small (1-33%) red granulation within the wound bed. There is a large (67-100%) amount of necrotic tissue within the wound bed including Eschar and Adherent Slough. The periwound skin appearance exhibited: Dry/Scaly, Erythema. The surrounding wound skin color is noted with erythema. Assessment Active Problems ICD-10 S60.571D - Other superficial bite of hand of right hand, subsequent encounter S60.571D - Other superficial bite of hand of right hand, subsequent encounter Plan Wound Cleansing: Wound #1R Right Hand - Web Between 1st and 2nd Digit: Clean wound with Normal Saline. May Shower, gently pat wound dry prior to applying new dressing. Wound #4 Right,Lateral Hand - Dorsum: Clean wound with Normal Saline. May Shower, gently pat wound dry prior to applying new dressing. Primary Wound Dressing: Wound #1R Right Hand - Web Between 1st and 2nd Digit: Silvercel Non-Adherent - or equal Wound #4 Right,Lateral Hand - Dorsum: Silvercel Non-Adherent - or equal Secondary Dressing: Wound #1R Right Hand - Web Between 1st and 2nd Digit: Coban - wrapped lightly Wound #4 Right,Lateral Hand - Dorsum: Coban - wrapped lightly Dressing Change Frequency: Wound #1R Right Hand - Web Between 1st and 2nd Digit: Change dressing every other day. [name], [name] ([id number]) Other: - Wear mittens to prevent biting of hands. Wound #4  Right,Lateral Hand - Dorsum: Change dressing every other day. Other: - Wear mittens to prevent biting of hands. Follow-up Appointments: Wound #1R Right Hand - Web Between 1st and 2nd Digit: Return Appointment in 1 week. Wound #  4 Right,Lateral Hand - Dorsum: Return Appointment in 1 week. #1 we applied silver alginate to the 2 wounds on the right hand, kerlix and maintained her own gloves to prevent licking and biting of the skin. Dressings to be changed daily #2 there is nothing open on the left hand Electronic Signature(s) Signed: 10/30/2016 4:12:03 PM By: Baltazar Najjar MD Entered By: Baltazar Najjar on 10/30/2016 15:00:16 [name] ([id number]) -------------------------------------------------------------------------------- SuperBill Details Patient Name: [name]. Date of Service: 10/30/2016 Medical Record Patient Account Number: [id number] Number: Treating RN: Huel Coventry [birth year] (51 y.o. Other Clinician: Date of Birth/Sex: Female) Treating Tiearra Colwell Primary Care Provider: Einar Crow Provider/Extender: G Referring Provider: Ardis Rowan in Treatment: 6 Diagnosis Coding ICD-10 Codes Code Description [phone] Other superficial bite of hand of left hand, subsequent encounter S60.571D Other superficial bite of hand of right hand, subsequent encounter L03.113 Cellulitis of right upper limb L03.114 Cellulitis of left upper limb Facility Procedures CPT4 Code: [id number] Description: [id number] - WOUND CARE VISIT-LEV 2 EST PT Modifier: Quantity: 1 Physician Procedures CPT4 Code Description: [id number] - WC PHYS LEVEL 2 - EST PT ICD-10 Description Diagnosis S60.571D Other superficial bite of hand of right hand, subse Modifier: quent encounte Quantity: 1 r Electronic Signature(s) Signed: 10/30/2016 4:12:03 PM By: Baltazar Najjar MD Previous Signature: 10/30/2016 2:40:40 PM Version By: Elliot Gurney, BSN, RN, CWS, Kim RN, BSN Entered  By: Baltazar Najjar on 10/30/2016 15:01:37

## 2016-11-03 NOTE — Progress Notes (Signed)
SHAI, RASMUSSEN ([id number]) Visit Report for 10/30/2016 Arrival Information Details Patient Name: [name], [name]. Date of Service: 10/30/2016 1:45 PM Medical Record Patient Account Number: [id number] Number: Treating RN: Huel Coventry [birth year] (51 y.o. Other Clinician: Date of Birth/Sex: Female) Treating ROBSON, MICHAEL Primary Care Delberta Folts: Einar Crow Adolph Clutter/Extender: G Referring Deetta Siegmann: Ardis Rowan in Treatment: 6 Visit Information History Since Last Visit Added or deleted any medications: No Patient Arrived: Wheel Chair Any new allergies or adverse No reactions: Arrival Time: 13:44 Had a fall or experienced change in No Accompanied By: caregiver activities of daily living that may Transfer Assistance: None affect Patient Identification Verified: Yes risk of falls: Secondary Verification Process Yes Signs or symptoms of abuse/neglect No Completed: since last visito Patient Has Alerts: Yes Hospitalized since last visit: No Pain Present Now: Unable to Respond Electronic Signature(s) Signed: 11/01/2016 4:29:15 PM By: Elliot Gurney, BSN, RN, CWS, Kim RN, BSN Entered By: Elliot Gurney, BSN, RN, CWS, Kim on 10/30/2016 13:44:51 [name] ([id number]) -------------------------------------------------------------------------------- Clinic Level of Care Assessment Details Patient Name: JASE, HIMMELBERGER. Date of Service: 10/30/2016 1:45 PM Medical Record Patient Account Number: [id number] Number: Treating RN: Huel Coventry 23-Jan-1965 (51 y.o. Other Clinician: Date of Birth/Sex: Female) Treating ROBSON, MICHAEL Primary Care Rodrigues Urbanek: Einar Crow Jveon Pound/Extender: G Referring Tyrique Sporn: Ardis Rowan in Treatment: 6 Clinic Level of Care Assessment Items TOOL 4 Quantity Score  - Use when only an EandM is performed on FOLLOW-UP visit 0 ASSESSMENTS - Nursing Assessment / Reassessment  - Reassessment of  Co-morbidities (includes updates in patient status) 0 X - Reassessment of Adherence to Treatment Plan 1 5 ASSESSMENTS - Wound and Skin Assessment / Reassessment X - Simple Wound Assessment / Reassessment - one wound 1 5  - Complex Wound Assessment / Reassessment - multiple wounds 0  - Dermatologic / Skin Assessment (not related to wound area) 0 ASSESSMENTS - Focused Assessment  - Circumferential Edema Measurements - multi extremities 0  - Nutritional Assessment / Counseling / Intervention 0  - Lower Extremity Assessment (monofilament, tuning fork, pulses) 0  - Peripheral Arterial Disease Assessment (using hand held doppler) 0 ASSESSMENTS - Ostomy and/or Continence Assessment and Care  - Incontinence Assessment and Management 0  - Ostomy Care Assessment and Management (repouching, etc.) 0 PROCESS - Coordination of Care X - Simple Patient / Family Education for ongoing care 1 15  - Complex (extensive) Patient / Family Education for ongoing care 0 X - Staff obtains Chiropractor, Records, Test Results / Process Orders 1 10  - Staff telephones HHA, Nursing Homes / Clarify orders / etc 0 Shanks, Pennye K. ([id number])  - Routine Transfer to another Facility (non-emergent condition) 0  - Routine Hospital Admission (non-emergent condition) 0  - New Admissions / Manufacturing engineer / Ordering NPWT, Apligraf, etc. 0  - Emergency Hospital Admission (emergent condition) 0 X - Simple Discharge Coordination 1 10  - Complex (extensive) Discharge Coordination 0 PROCESS - Special Needs  - Pediatric / Minor Patient Management 0  - Isolation Patient Management 0  - Hearing / Language / Visual special needs 0  - Assessment of Community assistance (transportation, D/C planning, etc.) 0  - Additional assistance / Altered mentation 0  - Support Surface(s) Assessment (bed, cushion, seat, etc.) 0 INTERVENTIONS - Wound Cleansing / Measurement X - Simple Wound  Cleansing - one wound 1 5  - Complex Wound Cleansing - multiple wounds 0 X - Wound Imaging (photographs - any number of wounds) 1 5  -  Wound Tracing (instead of photographs) 0 X - Simple Wound Measurement - one wound 1 5  - Complex Wound Measurement - multiple wounds 0 INTERVENTIONS - Wound Dressings X - Small Wound Dressing one or multiple wounds 1 10  - Medium Wound Dressing one or multiple wounds 0  - Large Wound Dressing one or multiple wounds 0  - Application of Medications - topical 0  - Application of Medications - injection 0 Nabor, Charisa K. ([id number]) INTERVENTIONS - Miscellaneous  - External ear exam 0  - Specimen Collection (cultures, biopsies, blood, body fluids, etc.) 0  - Specimen(s) / Culture(s) sent or taken to Lab for analysis 0  - Patient Transfer (multiple staff / Nurse, adult / Similar devices) 0  - Simple Staple / Suture removal (25 or less) 0  - Complex Staple / Suture removal (26 or more) 0  - Hypo / Hyperglycemic Management (close monitor of Blood Glucose) 0  - Ankle / Brachial Index (ABI) - do not check if billed separately 0 X - Vital Signs 1 5 Has the patient been seen at the hospital within the last three years: Yes Total Score: 75 Level Of Care: New/Established - Level 2 Electronic Signature(s) Signed: 11/01/2016 4:29:15 PM By: Elliot Gurney, BSN, RN, CWS, Kim RN, BSN Entered By: Elliot Gurney, BSN, RN, CWS, Kim on 10/30/2016 14:40:21 [name] ([id number]) -------------------------------------------------------------------------------- Encounter Discharge Information Details Patient Name: [name]. Date of Service: 10/30/2016 1:45 PM Medical Record Patient Account Number: [id number] Number: Treating RN: Huel Coventry 30-Oct-1965 (51 y.o. Other Clinician: Date of Birth/Sex: Female) Treating ROBSON, MICHAEL Primary Care Faven Watterson: Einar Crow Kanae Ignatowski/Extender: G Referring Jennie Hannay: Ardis Rowan in Treatment: 6 Encounter Discharge Information Items Discharge Pain Level: 0 Discharge Condition: Stable Ambulatory Status: Wheelchair Discharge Destination: Home Transportation: Private Auto Accompanied By: self Schedule Follow-up Appointment: Yes Medication Reconciliation completed and provided to Patient/Care Yes Niana Martorana: Provided on Clinical Summary of Care: 10/30/2016 Form Type Recipient Paper Patient MB Electronic Signature(s) Signed: 10/30/2016 2:41:04 PM By: Elliot Gurney, BSN, RN, CWS, Kim RN, BSN Entered By: Elliot Gurney, BSN, RN, CWS, Kim on 10/30/2016 14:41:03 [name] ([id number]) -------------------------------------------------------------------------------- Lower Extremity Assessment Details Patient Name: NYIAH, PIANKA. Date of Service: 10/30/2016 1:45 PM Medical Record Patient Account Number: [id number] Number: Treating RN: Huel Coventry 1965/07/29 (51 y.o. Other Clinician: Date of Birth/Sex: Female) Treating ROBSON, MICHAEL Primary Care Charmayne Odell: Einar Crow Anderia Lorenzo/Extender: G Referring Iosefa Weintraub: Ardis Rowan in Treatment: 6 Electronic Signature(s) Signed: 11/01/2016 4:29:15 PM By: Elliot Gurney, BSN, RN, CWS, Kim RN, BSN Entered By: Elliot Gurney, BSN, RN, CWS, Kim on 10/30/2016 14:26:21 [name] ([id number]) -------------------------------------------------------------------------------- Multi Wound Chart Details Patient Name: [name]. Date of Service: 10/30/2016 1:45 PM Medical Record Patient Account Number: [id number] Number: Treating RN: Huel Coventry 1965/09/11 (51 y.o. Other Clinician: Date of Birth/Sex: Female) Treating ROBSON, MICHAEL Primary Care Sagal Gayton: Einar Crow Brittanie Dosanjh/Extender: G Referring Gerrica Cygan: Ardis Rowan in Treatment: 6 Photos: [N/A:N/A] Wound Location: Right Hand - Web Right Hand - Dorsum - N/A Between 1st and 2nd Digit Lateral Wounding Event:  Trauma Bite N/A Primary Etiology: Trauma, Other Trauma, Other N/A Comorbid History: Sleep Apnea, Sleep Apnea, N/A Quadriplegia Quadriplegia Date Acquired: 08/26/2016 10/23/2016 N/A Weeks of Treatment: 6 0 N/A Wound Status: Open Open N/A Wound Recurrence: Yes No N/A Measurements L x W x D 1x2.5x0.1 1.5x0.7x0.1 N/A (cm) Area (cm) : 1.963 0.825 N/A Volume (cm) : 0.196 0.082 N/A % Reduction in Area: -56.20% N/A N/A % Reduction in Volume: -  55.60% N/A N/A Classification: Partial Thickness Full Thickness Without N/A Exposed Support Structures Exudate Amount: Medium Medium N/A Exudate Type: Serous Serosanguineous N/A Exudate Color: amber red, brown N/A Wound Margin: Flat and Intact Flat and Intact N/A Granulation Amount: None Present (0%) Small (1-33%) N/A Granulation Quality: N/A Red N/A Necrotic Amount: None Present (0%) Large (67-100%) N/A Necrotic Tissue: N/A Eschar, Adherent Slough N/A Exposed Structures: Fascia: No Fat Layer (Subcutaneous N/A Fat Layer (Subcutaneous Tissue) Exposed: Yes Tissue) Exposed: No Fascia: No Tendon: No Tendon: No Rogan, Lashala K. ([id number]) Muscle: No Muscle: No Joint: No Joint: No Bone: No Bone: No Limited to Skin Breakdown Epithelialization: None None N/A Periwound Skin Texture: No Abnormalities Noted No Abnormalities Noted N/A Periwound Skin Maceration: Yes Dry/Scaly: Yes N/A Moisture: Periwound Skin Color: No Abnormalities Noted Erythema: Yes N/A Tenderness on No No N/A Palpation: Wound Preparation: N/A Ulcer Cleansing: N/A Rinsed/Irrigated with Saline Topical Anesthetic Applied: None Treatment Notes Wound #1R (Right Hand - Web Between 1st and 2nd Digit) 1. Cleansed with: Clean wound with Normal Saline 4. Dressing Applied: Aquacel Ag Notes Conform secured with tape applied to hands. Wound #4 (Right, Lateral Hand - Dorsum) 1. Cleansed with: Clean wound with Normal Saline 4. Dressing Applied: Aquacel Ag Notes Conform  secured with tape applied to hands. Electronic Signature(s) Signed: 10/30/2016 4:12:03 PM By: Baltazar Najjar MD Previous Signature: 10/30/2016 2:39:39 PM Version By: Elliot Gurney BSN, RN, CWS, Kim RN, BSN Entered By: Baltazar Najjar on 10/30/2016 14:57:04 [name] ([id number]) -------------------------------------------------------------------------------- Multi-Disciplinary Care Plan Details Patient Name: DOSSIE, OCANAS. Date of Service: 10/30/2016 1:45 PM Medical Record Patient Account Number: [id number] Number: Treating RN: Huel Coventry 1965/12/30 (51 y.o. Other Clinician: Date of Birth/Sex: Female) Treating ROBSON, MICHAEL Primary Care Courtney Fenlon: Einar Crow Saraya Tirey/Extender: G Referring Lesleyanne Politte: Ardis Rowan in Treatment: 6 Active Inactive ` Soft Tissue Infection Nursing Diagnoses: Impaired tissue integrity Potential for infection: soft tissue Goals: Patient will remain free of wound infection Date Initiated: 10/30/2016 Target Resolution Date: 11/19/2016 Goal Status: Active Interventions: Assess signs and symptoms of infection every visit Notes: ` Wound/Skin Impairment Nursing Diagnoses: Impaired tissue integrity Goals: Ulcer/skin breakdown will have a volume reduction of 30% by week 4 Date Initiated: 10/30/2016 Target Resolution Date: 11/30/2016 Goal Status: Active Interventions: Assess ulceration(s) every visit Treatment Activities: Topical wound management initiated : 10/30/2016 Notes: KARYSSA, AMARAL ([id number]) Electronic Signature(s) Signed: 10/30/2016 2:39:30 PM By: Elliot Gurney, BSN, RN, CWS, Kim RN, BSN Previous Signature: 10/30/2016 2:39:16 PM Version By: Elliot Gurney, BSN, RN, CWS, Kim RN, BSN Entered By: Elliot Gurney, BSN, RN, CWS, Kim on 10/30/2016 14:39:29 [name] ([id number]) -------------------------------------------------------------------------------- Pain Assessment Details Patient Name: SRINIDHI, LANDERS. Date of Service: 10/30/2016 1:45 PM Medical Record Patient Account Number: [id number] Number: Treating RN: Huel Coventry 08/07/1965 (51 y.o. Other Clinician: Date of Birth/Sex: Female) Treating ROBSON, MICHAEL Primary Care Kienan Doublin: Einar Crow Arsenio Schnorr/Extender: G Referring Shalonda Sachse: Ardis Rowan in Treatment: 6 Active Problems Location of Pain Severity and Description of Pain Patient Has Paino Patient Unable to Respond Site Locations Pain Management and Medication Current Pain Management: Notes Patient does not appear to be in any pain. Electronic Signature(s) Signed: 11/01/2016 4:29:15 PM By: Elliot Gurney, BSN, RN, CWS, Kim RN, BSN Entered By: Elliot Gurney, BSN, RN, CWS, Kim on 10/30/2016 13:45:06 [name] ([id number]) -------------------------------------------------------------------------------- Patient/Caregiver Education Details Patient Name: [name] Date of Service: 10/30/2016 1:45 PM Medical Record Patient Account Number: [id number] Number: Treating RN: Huel Coventry 1965/04/11 (51 y.o. Other Clinician: Date of  Birth/Gender: Female) Treating Baltazar Najjar Primary Care Physician: Einar Crow Physician/Extender: G Referring Physician: Ardis Rowan in Treatment: 6 Education Assessment Education Provided To: Patient Education Topics Provided Wound/Skin Impairment: Handouts: Caring for Your Ulcer Methods: Demonstration, Explain/Verbal Responses: State content correctly Electronic Signature(s) Signed: 11/01/2016 4:29:15 PM By: Elliot Gurney, BSN, RN, CWS, Kim RN, BSN Entered By: Elliot Gurney, BSN, RN, CWS, Kim on 10/30/2016 14:41:12 [name] ([id number]) -------------------------------------------------------------------------------- Wound Assessment Details Patient Name: KALEIGH, SPIEGELMAN. Date of Service: 10/30/2016 1:45 PM Medical Record Patient Account Number: [id number] Number: Treating  RN: Huel Coventry 08/17/1965 (51 y.o. Other Clinician: Date of Birth/Sex: Female) Treating ROBSON, MICHAEL Primary Care Ijeoma Loor: Einar Crow Volney Reierson/Extender: G Referring Keilan Nichol: Ardis Rowan in Treatment: 6 Wound Status Wound Number: 1R Primary Etiology: Trauma, Other Wound Location: Right Hand - Web Between 1st Wound Status: Open and 2nd Digit Comorbid History: Sleep Apnea, Quadriplegia Wounding Event: Trauma Date Acquired: 08/26/2016 Weeks Of Treatment: 6 Clustered Wound: No Photos Photo Uploaded By: Elliot Gurney, BSN, RN, CWS, Kim on 10/30/2016 14:35:47 Wound Measurements Length: (cm) 1 Width: (cm) 2.5 Depth: (cm) 0.1 Area: (cm) 1.963 Volume: (cm) 0.196 % Reduction in Area: -56.2% % Reduction in Volume: -55.6% Epithelialization: None Tunneling: No Undermining: No Wound Description Classification: Partial Thickness Wound Margin: Flat and Intact Exudate Amount: Medium Exudate Type: Serous Exudate Color: amber Foul Odor After Cleansing: No Slough/Fibrino No Wound Bed Granulation Amount: None Present (0%) Exposed Structure Necrotic Amount: None Present (0%) Fascia Exposed: No Fat Layer (Subcutaneous Tissue) Exposed: No Tendon Exposed: No Anwar, Darcel K. ([id number]) Muscle Exposed: No Joint Exposed: No Bone Exposed: No Limited to Skin Breakdown Periwound Skin Texture Texture Color No Abnormalities Noted: No No Abnormalities Noted: No Moisture No Abnormalities Noted: No Maceration: Yes Treatment Notes Wound #1R (Right Hand - Web Between 1st and 2nd Digit) 1. Cleansed with: Clean wound with Normal Saline 4. Dressing Applied: Aquacel Ag Notes Conform secured with tape applied to hands. Electronic Signature(s) Signed: 11/01/2016 4:29:15 PM By: Elliot Gurney, BSN, RN, CWS, Kim RN, BSN Entered By: Elliot Gurney, BSN, RN, CWS, Kim on 10/30/2016 14:22:32 [name]  ([id number]) -------------------------------------------------------------------------------- Wound Assessment Details Patient Name: RHEGAN, TRUNNELL. Date of Service: 10/30/2016 1:45 PM Medical Record Patient Account Number: [id number] Number: Treating RN: Huel Coventry [birth year] (51 y.o. Other Clinician: Date of Birth/Sex: Female) Treating ROBSON, MICHAEL Primary Care Jayna Mulnix: Einar Crow Jarmal Lewelling/Extender: G Referring Doreatha Offer: Ardis Rowan in Treatment: 6 Wound Status Wound Number: 4 Primary Etiology: Trauma, Other Wound Location: Right Hand - Dorsum - Lateral Wound Status: Open Wounding Event: Bite Comorbid History: Sleep Apnea, Quadriplegia Date Acquired: 10/23/2016 Weeks Of Treatment: 0 Clustered Wound: No Photos Wound Measurements Length: (cm) 1.5 Width: (cm) 0.7 Depth: (cm) 0.1 Area: (cm) 0.825 Volume: (cm) 0.082 % Reduction in Area: % Reduction in Volume: Epithelialization: None Tunneling: No Undermining: No Wound Description Full Thickness Without Exposed Classification: Support Structures Wound Margin: Flat and Intact Exudate Medium Amount: Exudate Type: Serosanguineous Exudate Color: red, brown Foul Odor After Cleansing: No Slough/Fibrino No Wound Bed Granulation Amount: Small (1-33%) Exposed Structure Granulation Quality: Red Fascia Exposed: No Necrotic Amount: Large (67-100%) Fat Layer (Subcutaneous Tissue) Exposed: Yes Necrotic Quality: Eschar, Adherent Slough Tendon Exposed: No Winker, Brieonna K. ([id number]) Muscle Exposed: No Joint Exposed: No Bone Exposed: No Periwound Skin Texture Texture Color No Abnormalities Noted: No No Abnormalities Noted: No Erythema: Yes Moisture No Abnormalities Noted: No Dry / Scaly: Yes Wound Preparation Ulcer Cleansing: Rinsed/Irrigated with Saline Topical Anesthetic Applied: None Treatment  Notes Wound #4 (Right, Lateral Hand - Dorsum) 1. Cleansed with: Clean wound  with Normal Saline 4. Dressing Applied: Aquacel Ag Notes Conform secured with tape applied to hands. Electronic Signature(s) Signed: 11/01/2016 4:29:15 PM By: Elliot Gurney, BSN, RN, CWS, Kim RN, BSN Entered By: Elliot Gurney, BSN, RN, CWS, Kim on 10/30/2016 13:55:01

## 2016-11-05 ENCOUNTER — Encounter: Payer: Medicare Other | Admitting: Internal Medicine

## 2016-11-05 ENCOUNTER — Other Ambulatory Visit
Admission: RE | Admit: 2016-11-05 | Discharge: 2016-11-05 | Disposition: A | Discharge status: Home / Self Care | Payer: Medicare Other | Source: Ambulatory Visit | Attending: Internal Medicine | Admitting: Internal Medicine

## 2016-11-05 DIAGNOSIS — S61402A Unspecified open wound of left hand, initial encounter: Secondary | ICD-10-CM | POA: Diagnosis present

## 2016-11-05 DIAGNOSIS — S60571D Other superficial bite of hand of right hand, subsequent encounter: Secondary | ICD-10-CM | POA: Diagnosis not present

## 2016-11-05 DIAGNOSIS — X58XXXA Exposure to other specified factors, initial encounter: Secondary | ICD-10-CM | POA: Diagnosis not present

## 2016-11-06 NOTE — Progress Notes (Addendum)
[name], Teiana K. ([id number]) Visit Report for 11/05/2016 HPI Details Patient Name: [name], [name] Date of Service: 11/05/2016 2:15 PM Medical Record Patient Account Number: [id number] Number: Treating RN: 1965-02-15 (51 y.o. Other Clinician: Date of Birth/Sex: Female) Treating Mckinley Adelstein Primary Care Provider: Einar CrowAnderson, Marshall Provider/Extender: G Referring Provider: Ardis RowanAnderson, Marshall Weeks in Treatment: 6 History of Present Illness HPI Description: 09/18/16; this is a 51 year old woman with severe mental retardation, cerebral palsy. She apparently has a long history of repetitive movements of her hands back and forth to her mouth predominantly involving the lateral aspect of the hands especially the inner phalangeal area between the thumb and first fingers bilaterally. She was recently put on Augmentin by primary care for concern about cellulitis. She lives at [facility]. She has a history of cerebral palsy, mental retardation, functional quadriplegia although she has reasonable use of the left greater than right arm. She also has seizure disorder 10/02/16; the facility has obtained her gloves for her hands this is really helped. There is nothing open on the right hand however in the webspace between her thumb and first finger purulent drainage noted by her intake nurse. Small probing wound. 10/09/16; culture I did last week showed methicillin sensitive staph aureus that should've been well covered by the Augmentin. I gave her. In the meantime the facility where she is seems to be concerned about the glove. They gave her last time is being some form of restraint which is an issue, I'm well familiar with. We have been using silver alginate to the wound in the webspace between her thumb and first finger. Still looking at the left hand in the webspace between the him and first finger. 10/15/16; wounds in the left first and webspace caused by recurrent  bite/leaking injury has resolved. There is no evidence of infection. The facility is obtained really nice gloves to protect her hands. These can be removed for bathing and at mealtimes with the patient is apparently able to participate in feeding. 10/30/16; apparently the last time she was here there was a wound on her right lateral hand however we did not look at this underneath her glove. She arrived today in clinic with wraps very tightly around both hands. 11/05/16; patient arrived today with both hands in a very poor state. Firstly on the right she had swelling redness and pain on the top of her hand and especially involving the right. This is indicative of cellulitis. Necrotic wound in the webspace between the thumb and the first finger, fourth and fifth finger and a large superficial wound over the dorsal right hand. oOn the left that she had 2 wounds one on the dorsal hand and one in the webspace first and second digits. oMost concerning thing is the degree of swelling erythema and tenderness on the dorsal right hand Electronic Signature(s) Signed: 11/05/2016 4:50:14 PM By: Baltazar Najjarobson, Treven Holtman MD [name], [name] ([id number]) Entered By: Baltazar Najjarobson, Ediberto Sens on 11/05/2016 15:18:22 [name], [name] ([id number]) -------------------------------------------------------------------------------- Physical Exam Details Patient Name: [name], [name] Date of Service: 11/05/2016 2:15 PM Medical Record Patient Account Number: [id number] Number: Treating RN: 1965-02-15 (51 y.o. Other Clinician: Date of Birth/Sex: Female) Treating Hannan Hutmacher Primary Care Provider: Einar CrowAnderson, Marshall Provider/Extender: G Referring Provider: Ardis RowanAnderson, Marshall Weeks in Treatment: 6 Constitutional Sitting or standing Blood Pressure is within target range for patient.. Pulse regular and within target range for patient.Marland Kitchen. Respirations regular, non-labored and within target range.. Temperature is normal  and within the target range for  the patient.Marland Kitchen appears in no distress. She does not look systemically unwell. Eyes Conjunctivae clear. No discharge. Respiratory Respiratory effort is easy and symmetric bilaterally. Rate is normal at rest and on room air.Marland Kitchen Lymphatic None palpable in the epitrochlear or axillary areas. Psychiatric No evidence of depression, anxiety, or agitation. Calm, cooperative, and communicative. Appropriate interactions and affect.. Notes Wound exam; the left hand first web space is now reopened. There is a large wound on the dorsal left hand oSeveral areas on the right hand are concerning especially between the right thumb and first finger necrotic wound that I have cultured. Large wound on the dorsal right hand and on the dorsal right wrist oNo debrided minute in any area oSwelling erythema and tenderness on the dorsal right hand Electronic Signature(s) Signed: 11/05/2016 4:50:14 PM By: Baltazar Najjar MD Entered By: Baltazar Najjar on 11/05/2016 15:21:03 [name] ([id number]) -------------------------------------------------------------------------------- Physician Orders Details Patient Name: [name]. Date of Service: 11/05/2016 2:15 PM Medical Record Patient Account Number: [id number] Number: Treating RN: 01-02-1966 (51 y.o. Other Clinician: Date of Birth/Sex: Female) Treating Lasheika Ortloff Primary Care Provider: Einar Crow Provider/Extender: G Referring Provider: Ardis Rowan in Treatment: 6 Verbal / Phone Orders: No Diagnosis Coding Wound Cleansing Wound #1R Right Hand - Web Between 1st and 2nd Digit o Clean wound with Normal Saline. o Cleanse wound with mild soap and water Wound #4 Right,Lateral Hand - Dorsum o Clean wound with Normal Saline. o Cleanse wound with mild soap and water Wound #5 Right,Medial Wrist o Clean wound with Normal Saline. o Cleanse wound with mild soap and  water Wound #6 Right Hand - Web Between 4th and 5th Digit o Clean wound with Normal Saline. o Cleanse wound with mild soap and water Wound #7 Left,Dorsal Hand - Dorsum o Clean wound with Normal Saline. o Cleanse wound with mild soap and water Wound #8 Left Hand - Web Between 1st and 2nd Digit o Clean wound with Normal Saline. o Cleanse wound with mild soap and water Primary Wound Dressing Wound #1R Right Hand - Web Between 1st and 2nd Digit o Other: - Silvercell Ag or equal Wound #4 Right,Lateral Hand - Dorsum o Other: - Silvercell Ag or equal Wound #5 Right,Medial Wrist o Other: - Silvercell Ag or equal Wound #6 Right Hand - Web Between 4th and 5th Digit ARYANNE, GILLELAND. ([id number]) o Other: - Silvercell Ag or equal Wound #7 Left,Dorsal Hand - Dorsum o Other: - Silvercell Ag or equal Wound #8 Left Hand - Web Between 1st and 2nd Digit o Other: - Silvercell Ag or equal Secondary Dressing Wound #1R Right Hand - Web Between 1st and 2nd Digit o Conform/Kerlix - DO NOT USE COBAN! Wound #4 Right,Lateral Hand - Dorsum o Conform/Kerlix - DO NOT USE COBAN! Wound #5 Right,Medial Wrist o Conform/Kerlix - DO NOT USE COBAN! Wound #6 Right Hand - Web Between 4th and 5th Digit o Conform/Kerlix - DO NOT USE COBAN! Wound #7 Left,Dorsal Hand - Dorsum o Conform/Kerlix - DO NOT USE COBAN! Wound #8 Left Hand - Web Between 1st and 2nd Digit o Conform/Kerlix - DO NOT USE COBAN! Dressing Change Frequency Wound #1R Right Hand - Web Between 1st and 2nd Digit o Change dressing every other day. Wound #4 Right,Lateral Hand - Dorsum o Change dressing every other day. Wound #5 Right,Medial Wrist o Change dressing every other day. Wound #6 Right Hand - Web Between 4th and 5th Digit o Change dressing every other day. Wound #7 Left,Dorsal  Hand - Dorsum o Change dressing every other day. Wound #8 Left Hand - Web Between 1st and 2nd Digit o Change  dressing every other day. Follow-up Appointments Wound #1R Right Hand - Web Between 1st and 2nd Digit SINAYA, MINOGUE. ([id number]) o Return Appointment in 2 weeks. Wound #4 Right,Lateral Hand - Dorsum o Return Appointment in 2 weeks. Wound #5 Right,Medial Wrist o Return Appointment in 2 weeks. Wound #6 Right Hand - Web Between 4th and 5th Digit o Return Appointment in 2 weeks. Wound #7 Left,Dorsal Hand - Dorsum o Return Appointment in 2 weeks. Wound #8 Left Hand - Web Between 1st and 2nd Digit o Return Appointment in 2 weeks. Patient Medications Allergies: No Known Drug Allergies Notifications Medication Indication Start End Augmentin severe cellulitis 11/05/2016 right hand DOSE oral 250 mg/5 mL-62.5 mg/5 mL suspension for reconstitution - suspension for reconstitution oral 250mg /78ml, 10ml tid (500 mg tid) 7 days Electronic Signature(s) Signed: 11/05/2016 4:50:14 PM By: Baltazar Najjar MD Signed: 11/05/2016 5:09:21 PM By: Elliot Gurney, BSN, RN, CWS, Kim RN, BSN Previous Signature: 11/05/2016 2:56:35 PM Version By: Baltazar Najjar MD Entered By: Elliot Gurney, BSN, RN, CWS, Kim on 11/05/2016 14:59:03 Ogata, [name] ([id number]) -------------------------------------------------------------------------------- Problem List Details Patient Name: KAMESHIA, MADRUGA. Date of Service: 11/05/2016 2:15 PM Medical Record Patient Account Number: [id number] Number: Treating RN: 12-30-1965 (51 y.o. Other Clinician: Date of Birth/Sex: Female) Treating Rome Schlauch Primary Care Provider: Einar Crow Provider/Extender: G Referring Provider: Ardis Rowan in Treatment: 6 Active Problems ICD-10 Encounter Code Description Active Date Diagnosis S60.571D Other superficial bite of hand of right hand, subsequent 09/18/2016 Yes encounter S60.571D Other superficial bite of hand of right hand, subsequent 10/30/2016 Yes encounter L03.113 Cellulitis of right upper  limb 11/05/2016 Yes Inactive Problems ICD-10 Code Description Active Date Inactive Date S60.572D Other superficial bite of hand of left hand, subsequent 09/18/2016 09/18/2016 encounter L03.113 Cellulitis of right upper limb 09/18/2016 09/18/2016 L03.114 Cellulitis of left upper limb 09/18/2016 09/18/2016 Resolved Problems Electronic Signature(s) Signed: 11/05/2016 4:50:14 PM By: Baltazar Najjar MD Entered By: Baltazar Najjar on 11/05/2016 15:12:32 TATIONA, STECH ([id number]) TENESHIA, HEDEEN ([id number]) -------------------------------------------------------------------------------- Progress Note Details Patient Name: AAVA, DELAND. Date of Service: 11/05/2016 2:15 PM Medical Record Patient Account Number: [id number] Number: Treating RN: 1965/02/28 (51 y.o. Other Clinician: Date of Birth/Sex: Female) Treating Terryann Verbeek Primary Care Provider: Einar Crow Provider/Extender: G Referring Provider: Ardis Rowan in Treatment: 6 Subjective History of Present Illness (HPI) 09/18/16; this is a 51 year old woman with severe mental retardation, cerebral palsy. She apparently has a long history of repetitive movements of her hands back and forth to her mouth predominantly involving the lateral aspect of the hands especially the inner phalangeal area between the thumb and first fingers bilaterally. She was recently put on Augmentin by primary care for concern about cellulitis. She lives at [facility]. She has a history of cerebral palsy, mental retardation, functional quadriplegia although she has reasonable use of the left greater than right arm. She also has seizure disorder 10/02/16; the facility has obtained her gloves for her hands this is really helped. There is nothing open on the right hand however in the webspace between her thumb and first finger purulent drainage noted by her intake nurse. Small probing wound. 10/09/16; culture I did  last week showed methicillin sensitive staph aureus that should've been well covered by the Augmentin. I gave her. In the meantime the facility where she is seems to be concerned about  the glove. They gave her last time is being some form of restraint which is an issue, I'm well familiar with. We have been using silver alginate to the wound in the webspace between her thumb and first finger. Still looking at the left hand in the webspace between the him and first finger. 10/15/16; wounds in the left first and webspace caused by recurrent bite/leaking injury has resolved. There is no evidence of infection. The facility is obtained really nice gloves to protect her hands. These can be removed for bathing and at mealtimes with the patient is apparently able to participate in feeding. 10/30/16; apparently the last time she was here there was a wound on her right lateral hand however we did not look at this underneath her glove. She arrived today in clinic with wraps very tightly around both hands. 11/05/16; patient arrived today with both hands in a very poor state. Firstly on the right she had swelling redness and pain on the top of her hand and especially involving the right. This is indicative of cellulitis. Necrotic wound in the webspace between the thumb and the first finger, fourth and fifth finger and a large superficial wound over the dorsal right hand. On the left that she had 2 wounds one on the dorsal hand and one in the webspace first and second digits. Most concerning thing is the degree of swelling erythema and tenderness on the dorsal right hand Burkard, Nikkol K. ([id number]) Objective Constitutional Sitting or standing Blood Pressure is within target range for patient.. Pulse regular and within target range for patient.Marland Kitchen Respirations regular, non-labored and within target range.. Temperature is normal and within the target range for the patient.Marland Kitchen appears in no distress. She does not  look systemically unwell. Vitals Time Taken: 2:32 PM, Temperature: 98.3 F, Pulse: 112 bpm, Respiratory Rate: 16 breaths/min, Blood Pressure: 105/83 mmHg. Eyes Conjunctivae clear. No discharge. Respiratory Respiratory effort is easy and symmetric bilaterally. Rate is normal at rest and on room air.Marland Kitchen Lymphatic None palpable in the epitrochlear or axillary areas. Psychiatric No evidence of depression, anxiety, or agitation. Calm, cooperative, and communicative. Appropriate interactions and affect.. General Notes: Wound exam; the left hand first web space is now reopened. There is a large wound on the dorsal left hand Several areas on the right hand are concerning especially between the right thumb and first finger necrotic wound that I have cultured. Large wound on the dorsal right hand and on the dorsal right wrist No debrided minute in any area Swelling erythema and tenderness on the dorsal right hand Integumentary (Hair, Skin) Wound #1R status is Open. Original cause of wound was Trauma. The wound is located on the Right Hand - Web Between 1st and 2nd Digit. The wound measures 1cm length x 2.8cm width x 0.1cm depth; 2.199cm^2 area and 0.22cm^3 volume. There is Fat Layer (Subcutaneous Tissue) Exposed exposed. There is no tunneling or undermining noted. There is a medium amount of serous drainage noted. The wound margin is flat and intact. There is medium (34-66%) red granulation within the wound bed. There is a medium (34- 66%) amount of necrotic tissue within the wound bed including Adherent Slough. The periwound skin appearance exhibited: Erythema. The periwound skin appearance did not exhibit: Callus, Crepitus, Excoriation, Induration, Rash, Scarring, Dry/Scaly, Maceration, Atrophie Blanche, Cyanosis, Ecchymosis, Hemosiderin Staining, Mottled, Pallor, Rubor. The surrounding wound skin color is noted with erythema which is circumferential. Periwound temperature was noted as Hot. Wound  #4 status is Open. Original cause of wound  was Bite. The wound is located on the Right,Lateral Hand - Dorsum. The wound measures 2.2cm length x 1cm width x 0.1cm depth; 1.728cm^2 area and 0.173cm^3 volume. There is Fat Layer (Subcutaneous Tissue) Exposed exposed. There is no tunneling or undermining noted. There is a medium amount of serosanguineous drainage noted. The wound margin is flat and intact. There is no granulation within the wound bed. There is a large (67-100%) amount of necrotic tissue within the wound bed including Eschar and Adherent Slough. The periwound skin appearance exhibited: Dry/Scaly, Erythema. The surrounding wound skin color is noted with erythema. Periwound Cottam, Jimena K. ([id number]) temperature was noted as Hot. Wound #5 status is Open. Original cause of wound was Trauma. The wound is located on the Right,Medial Wrist. The wound measures 4.2cm length x 2.5cm width x 0.1cm depth; 8.247cm^2 area and 0.825cm^3 volume. There is Fat Layer (Subcutaneous Tissue) Exposed exposed. There is no tunneling or undermining noted. There is a large amount of serous drainage noted. The wound margin is flat and intact. There is medium (34-66%) red granulation within the wound bed. There is a medium (34-66%) amount of necrotic tissue within the wound bed including Adherent Slough. The periwound skin appearance exhibited: Maceration, Erythema. The periwound skin appearance did not exhibit: Callus, Crepitus, Excoriation, Induration, Rash, Scarring, Dry/Scaly, Atrophie Blanche, Cyanosis, Ecchymosis, Hemosiderin Staining, Mottled, Pallor, Rubor. The surrounding wound skin color is noted with erythema which is circumferential. Periwound temperature was noted as Hot. Wound #6 status is Open. Original cause of wound was Trauma. The wound is located on the Right Hand - Web Between 4th and 5th Digit. The wound measures 2.5cm length x 0.1cm width x 0.1cm depth; 0.196cm^2 area and 0.02cm^3  volume. There is no tunneling or undermining noted. There is a small amount of serous drainage noted. The wound margin is flat and intact. There is large (67-100%) red granulation within the wound bed. There is a small (1-33%) amount of necrotic tissue within the wound bed including Adherent Slough. The periwound skin appearance did not exhibit: Callus, Crepitus, Excoriation, Induration, Rash, Scarring, Dry/Scaly, Maceration, Atrophie Blanche, Cyanosis, Ecchymosis, Hemosiderin Staining, Mottled, Pallor, Rubor, Erythema. Periwound temperature was noted as Hot. Wound #7 status is Open. Original cause of wound was Trauma. The wound is located on the Left,Dorsal Hand - Dorsum. The wound measures 4.2cm length x 2.2cm width x 0.1cm depth; 7.257cm^2 area and 0.726cm^3 volume. There is no tunneling or undermining noted. There is a large amount of serous drainage noted. The wound margin is flat and intact. There is medium (34-66%) pink granulation within the wound bed. There is a medium (34-66%) amount of necrotic tissue within the wound bed including Adherent Slough. The periwound skin appearance exhibited: Erythema. The periwound skin appearance did not exhibit: Callus, Crepitus, Excoriation, Induration, Rash, Scarring, Dry/Scaly, Maceration, Atrophie Blanche, Cyanosis, Ecchymosis, Hemosiderin Staining, Mottled, Pallor, Rubor. The surrounding wound skin color is noted with erythema which is circumferential. Periwound temperature was noted as Hot. Wound #8 status is Open. Original cause of wound was Trauma. The wound is located on the Left Hand - Web Between 1st and 2nd Digit. The wound measures 2cm length x 3cm width x 0.1cm depth; 4.712cm^2 area and 0.471cm^3 volume. There is Fat Layer (Subcutaneous Tissue) Exposed exposed. There is no tunneling or undermining noted. There is a large amount of serous drainage noted. The wound margin is flat and intact. There is medium (34-66%) pink granulation within  the wound bed. There is a medium (34-66%) amount of necrotic  tissue within the wound bed including Adherent Slough. The periwound skin appearance exhibited: Erythema. The periwound skin appearance did not exhibit: Callus, Crepitus, Excoriation, Induration, Rash, Scarring, Dry/Scaly, Maceration, Atrophie Blanche, Cyanosis, Ecchymosis, Hemosiderin Staining, Mottled, Pallor, Rubor. The surrounding wound skin color is noted with erythema which is circumferential. Periwound temperature was noted as Hot. Assessment Active Problems KELISHA, DALL. ([id number]) ICD-10 S60.571D - Other superficial bite of hand of right hand, subsequent encounter S60.571D - Other superficial bite of hand of right hand, subsequent encounter L03.113 - Cellulitis of right upper limb Plan Wound Cleansing: Wound #1R Right Hand - Web Between 1st and 2nd Digit: Clean wound with Normal Saline. Cleanse wound with mild soap and water Wound #4 Right,Lateral Hand - Dorsum: Clean wound with Normal Saline. Cleanse wound with mild soap and water Wound #5 Right,Medial Wrist: Clean wound with Normal Saline. Cleanse wound with mild soap and water Wound #6 Right Hand - Web Between 4th and 5th Digit: Clean wound with Normal Saline. Cleanse wound with mild soap and water Wound #7 Left,Dorsal Hand - Dorsum: Clean wound with Normal Saline. Cleanse wound with mild soap and water Wound #8 Left Hand - Web Between 1st and 2nd Digit: Clean wound with Normal Saline. Cleanse wound with mild soap and water Primary Wound Dressing: Wound #1R Right Hand - Web Between 1st and 2nd Digit: Other: - Silvercell Ag or equal Wound #4 Right,Lateral Hand - Dorsum: Other: - Silvercell Ag or equal Wound #5 Right,Medial Wrist: Other: - Silvercell Ag or equal Wound #6 Right Hand - Web Between 4th and 5th Digit: Other: - Silvercell Ag or equal Wound #7 Left,Dorsal Hand - Dorsum: Other: - Silvercell Ag or equal Wound #8 Left Hand - Web Between  1st and 2nd Digit: Other: - Silvercell Ag or equal Secondary Dressing: Wound #1R Right Hand - Web Between 1st and 2nd Digit: Conform/Kerlix - DO NOT USE COBAN! Wound #4 Right,Lateral Hand - Dorsum: Conform/Kerlix - DO NOT USE COBAN! Wound #5 Right,Medial Wrist: Symonds, Lulubelle K. ([id number]) Conform/Kerlix - DO NOT USE COBAN! Wound #6 Right Hand - Web Between 4th and 5th Digit: Conform/Kerlix - DO NOT USE COBAN! Wound #7 Left,Dorsal Hand - Dorsum: Conform/Kerlix - DO NOT USE COBAN! Wound #8 Left Hand - Web Between 1st and 2nd Digit: Conform/Kerlix - DO NOT USE COBAN! Dressing Change Frequency: Wound #1R Right Hand - Web Between 1st and 2nd Digit: Change dressing every other day. Wound #4 Right,Lateral Hand - Dorsum: Change dressing every other day. Wound #5 Right,Medial Wrist: Change dressing every other day. Wound #6 Right Hand - Web Between 4th and 5th Digit: Change dressing every other day. Wound #7 Left,Dorsal Hand - Dorsum: Change dressing every other day. Wound #8 Left Hand - Web Between 1st and 2nd Digit: Change dressing every other day. Follow-up Appointments: Wound #1R Right Hand - Web Between 1st and 2nd Digit: Return Appointment in 2 weeks. Wound #4 Right,Lateral Hand - Dorsum: Return Appointment in 2 weeks. Wound #5 Right,Medial Wrist: Return Appointment in 2 weeks. Wound #6 Right Hand - Web Between 4th and 5th Digit: Return Appointment in 2 weeks. Wound #7 Left,Dorsal Hand - Dorsum: Return Appointment in 2 weeks. Wound #8 Left Hand - Web Between 1st and 2nd Digit: Return Appointment in 2 weeks. The following medication(s) was prescribed: Augmentin oral 250 mg/5 mL-62.5 mg/5 mL suspension for reconstitution suspension for reconstitution oral 250mg /47ml, 10ml tid (500 mg tid) 7 days for severe cellulitis right hand starting 11/05/2016 o Boreman, Bali K. (  [id number]) #1 very concerning situation today. She does not look systemically under well although hand  infections are concerning #2 significant cellulitis in the dorsum of the right hand involving the right thumb #3 culture of the necrotic wound in the thumb first finger on the right #4 Augmentin 250 per 5 ML's 10 mL cecal 500 mg 3 times a day for 7 days #5 I would feel better if we could get home health into this situation although we don't seem to be able to. I don't really understand the issue #6 silver alginate all wound areas #7 whomever is wrapping her hands at the facility is doing this in a manner that is frankly too tight with Coban. We wrote orders to reflect this Electronic Signature(s) Signed: 11/05/2016 4:50:14 PM By: Baltazar Najjar MD Entered By: Baltazar Najjar on 11/05/2016 15:26:26 [name] ([id number]) -------------------------------------------------------------------------------- SuperBill Details Patient Name: [name]. Date of Service: 11/05/2016 Medical Record Patient Account Number: [id number] Number: Treating RN: [birth year] (51 y.o. Other Clinician: Date of Birth/Sex: Female) Treating Cree Kunert Primary Care Provider: Einar Crow Provider/Extender: G Referring Provider: Ardis Rowan in Treatment: 6 Diagnosis Coding ICD-10 Codes Code Description S60.571D Other superficial bite of hand of right hand, subsequent encounter S60.571D Other superficial bite of hand of right hand, subsequent encounter L03.113 Cellulitis of right upper limb Facility Procedures CPT4 Code: [id number] Description: [phone] - WOUND CARE VISIT-LEV 5 EST PT Modifier: Quantity: 1 Physician Procedures CPT4 Code Description: [id number] - WC PHYS LEVEL 3 - EST PT ICD-10 Description Diagnosis S60.571D Other superficial bite of hand of right hand, sub L03.113 Cellulitis of right upper limb Modifier: sequent encounte Quantity: 1 r Electronic Signature(s) Signed: 11/05/2016 5:09:21 PM By: Elliot Gurney, BSN, RN, CWS, Kim RN, BSN Previous  Signature: 11/05/2016 4:50:14 PM Version By: Baltazar Najjar MD Entered By: Elliot Gurney, BSN, RN, CWS, Kim on 11/05/2016 17:01:17

## 2016-11-07 NOTE — Progress Notes (Signed)
[name], [name] ([id number]) Visit Report for 11/05/2016 Arrival Information Details Patient Name: [name]. Date of Service: 11/05/2016 2:15 PM Medical Record Patient Account Number: [id number] Number: Treating RN: Curtis Sites [birth year] (51 y.o. Other Clinician: Date of Birth/Sex: Female) Treating ROBSON, MICHAEL Primary Care Talibah Colasurdo: Einar Crow Bonni Neuser/Extender: G Referring Supreme Rybarczyk: Ardis Rowan in Treatment: 6 Visit Information History Since Last Visit Added or deleted any medications: No Patient Arrived: Wheel Chair Any new allergies or adverse No reactions: Arrival Time: 14:26 Had a fall or experienced change in No Accompanied By: staff activities of daily living that may Transfer Assistance: None affect Patient Identification Verified: Yes risk of falls: Secondary Verification Process Yes Signs or symptoms of abuse/neglect No Completed: since last visito Patient Has Alerts: Yes Hospitalized since last visit: No Has Dressing in Place as Yes Prescribed: Pain Present Now: Unable to Respond Electronic Signature(s) Signed: 11/05/2016 5:02:03 PM By: Curtis Sites Entered By: Curtis Sites on 11/05/2016 14:28:23 [name] ([id number]) -------------------------------------------------------------------------------- Clinic Level of Care Assessment Details Patient Name: [name]. Date of Service: 11/05/2016 2:15 PM Medical Record Patient Account Number: [id number] Number: Treating RN: Huel Coventry 1965-11-20 (51 y.o. Other Clinician: Date of Birth/Sex: Female) Treating ROBSON, MICHAEL Primary Care Lawerence Dery: Einar Crow Malva Diesing/Extender: G Referring Maisee Vollman: Ardis Rowan in Treatment: 6 Clinic Level of Care Assessment Items TOOL 4 Quantity Score []  - Use when only an EandM is performed on FOLLOW-UP visit 0 ASSESSMENTS - Nursing Assessment / Reassessment X - Reassessment  of Co-morbidities (includes updates in patient status) 1 10 X - Reassessment of Adherence to Treatment Plan 1 5 ASSESSMENTS - Wound and Skin Assessment / Reassessment []  - Simple Wound Assessment / Reassessment - one wound 0 X - Complex Wound Assessment / Reassessment - multiple wounds 5 5 []  - Dermatologic / Skin Assessment (not related to wound area) 0 ASSESSMENTS - Focused Assessment []  - Circumferential Edema Measurements - multi extremities 0 []  - Nutritional Assessment / Counseling / Intervention 0 []  - Lower Extremity Assessment (monofilament, tuning fork, pulses) 0 []  - Peripheral Arterial Disease Assessment (using hand held doppler) 0 ASSESSMENTS - Ostomy and/or Continence Assessment and Care []  - Incontinence Assessment and Management 0 []  - Ostomy Care Assessment and Management (repouching, etc.) 0 PROCESS - Coordination of Care []  - Simple Patient / Family Education for ongoing care 0 X - Complex (extensive) Patient / Family Education for ongoing care 1 20 X - Staff obtains Chiropractor, Records, Test Results / Process Orders 1 10 []  - Staff telephones HHA, Nursing Homes / Clarify orders / etc 0 Szostak, Tynia K. ([id number]) []  - Routine Transfer to another Facility (non-emergent condition) 0 []  - Routine Hospital Admission (non-emergent condition) 0 []  - New Admissions / Manufacturing engineer / Ordering NPWT, Apligraf, etc. 0 []  - Emergency Hospital Admission (emergent condition) 0 X - Simple Discharge Coordination 1 10 []  - Complex (extensive) Discharge Coordination 0 PROCESS - Special Needs []  - Pediatric / Minor Patient Management 0 []  - Isolation Patient Management 0 []  - Hearing / Language / Visual special needs 0 []  - Assessment of Community assistance (transportation, D/C planning, etc.) 0 []  - Additional assistance / Altered mentation 0 []  - Support Surface(s) Assessment (bed, cushion, seat, etc.) 0 INTERVENTIONS - Wound Cleansing / Measurement []  - Simple Wound  Cleansing - one wound 0 X - Complex Wound Cleansing - multiple wounds 5 5 X - Wound Imaging (photographs - any number of wounds) 1 5 []  -  Wound Tracing (instead of photographs) 0 []  - Simple Wound Measurement - one wound 0 X - Complex Wound Measurement - multiple wounds 5 5 INTERVENTIONS - Wound Dressings []  - Small Wound Dressing one or multiple wounds 0 X - Medium Wound Dressing one or multiple wounds 2 15 []  - Large Wound Dressing one or multiple wounds 0 []  - Application of Medications - topical 0 []  - Application of Medications - injection 0 Ciotti, Oneika K. ([id number]) INTERVENTIONS - Miscellaneous []  - External ear exam 0 []  - Specimen Collection (cultures, biopsies, blood, body fluids, etc.) 0 []  - Specimen(s) / Culture(s) sent or taken to Lab for analysis 0 X - Patient Transfer (multiple staff / Nurse, adult / Similar devices) 1 10 []  - Simple Staple / Suture removal (25 or less) 0 []  - Complex Staple / Suture removal (26 or more) 0 []  - Hypo / Hyperglycemic Management (close monitor of Blood Glucose) 0 []  - Ankle / Brachial Index (ABI) - do not check if billed separately 0 X - Vital Signs 1 5 Has the patient been seen at the hospital within the last three years: Yes Total Score: 180 Level Of Care: New/Established - Level 5 Electronic Signature(s) Signed: 11/05/2016 5:09:21 PM By: Elliot Gurney, BSN, RN, CWS, Kim RN, BSN Entered By: Elliot Gurney, BSN, RN, CWS, Kim on 11/05/2016 15:00:06 [name] ([id number]) -------------------------------------------------------------------------------- Encounter Discharge Information Details Patient Name: [name]. Date of Service: 11/05/2016 2:15 PM Medical Record Patient Account Number: [id number] Number: Treating RN: Huel Coventry [birth year] (51 y.o. Other Clinician: Date of Birth/Sex: Female) Treating ROBSON, MICHAEL Primary Care Lillieann Pavlich: Einar Crow Kelsye Loomer/Extender: G Referring Doyl Bitting: Ardis Rowan in Treatment: 6 Encounter Discharge Information Items Discharge Pain Level: 0 Discharge Condition: Stable Ambulatory Status: Ambulatory Discharge Destination: Home Transportation: Private Auto Accompanied By: caregiver Schedule Follow-up Appointment: Yes Medication Reconciliation completed and provided to Patient/Care Yes Eilis Chestnutt: Provided on Clinical Summary of Care: 11/05/2016 Form Type Recipient Paper Patient MB Electronic Signature(s) Signed: 11/06/2016 3:48:01 PM By: Gwenlyn Perking Entered By: Gwenlyn Perking on 11/05/2016 15:04:01 [name] ([id number]) -------------------------------------------------------------------------------- Lower Extremity Assessment Details Patient Name: [name]. Date of Service: 11/05/2016 2:15 PM Medical Record Patient Account Number: [id number] Number: Treating RN: Huel Coventry 10/06/1965 (51 y.o. Other Clinician: Date of Birth/Sex: Female) Treating ROBSON, MICHAEL Primary Care Griffin Gerrard: Einar Crow Marcus Groll/Extender: G Referring Cassady Stanczak: Ardis Rowan in Treatment: 6 Electronic Signature(s) Signed: 11/05/2016 5:09:21 PM By: Elliot Gurney, BSN, RN, CWS, Kim RN, BSN Entered By: Elliot Gurney, BSN, RN, CWS, Kim on 11/05/2016 14:47:37 [name] ([id number]) -------------------------------------------------------------------------------- Multi Wound Chart Details Patient Name: [name]. Date of Service: 11/05/2016 2:15 PM Medical Record Patient Account Number: [id number] Number: Treating RN: Huel Coventry September 04, 1965 (51 y.o. Other Clinician: Date of Birth/Sex: Female) Treating ROBSON, MICHAEL Primary Care Alisandra Son: Einar Crow Arihanna Estabrook/Extender: G Referring Krystle Polcyn: Ardis Rowan in Treatment: 6 Vital Signs Height(in): Pulse(bpm): 112 Weight(lbs): Blood Pressure 105/83 (mmHg): Body Mass Index(BMI): Temperature(F): 98.3 Respiratory  Rate 16 (breaths/min): Photos: [1R:No Photos] [4:No Photos] [5:No Photos] Wound Location: [1R:Right Hand - Web Between 1st and 2nd Digit] [4:Right Hand - Dorsum - Lateral] [5:Right Wrist - Medial] Wounding Event: [1R:Trauma] [4:Bite] [5:Trauma] Primary Etiology: [1R:Trauma, Other] [4:Trauma, Other] [5:Trauma, Other] Comorbid History: [1R:Sleep Apnea, Quadriplegia] [4:Sleep Apnea, Quadriplegia] [5:Sleep Apnea, Quadriplegia] Date Acquired: [1R:08/26/2016] [4:10/23/2016] [5:11/04/2016] Weeks of Treatment: [1R:6] [4:0] [5:0] Wound Status: [1R:Open] [4:Open] [5:Open] Wound Recurrence: [1R:Yes] [4:No] [5:No] Measurements L x W x D 1x2.8x0.1 [4:2.2x1x0.1] [5:4.2x2.5x0.1] (cm)  Area (cm) : [1R:2.199] [4:1.728] [5:8.247] Volume (cm) : [1R:0.22] [4:0.173] [5:0.825] % Reduction in Area: [1R:-74.90%] [4:-109.50%] [5:N/A] % Reduction in Volume: -74.60% [4:-111.00%] [5:N/A] Classification: [1R:Partial Thickness] [4:Full Thickness Without Exposed Support Structures] [5:Full Thickness Without Exposed Support Structures] Exudate Amount: [1R:Medium] [4:Medium] [5:Large] Exudate Type: [1R:Serous] [4:Serosanguineous] [5:Serous] Exudate Color: [1R:amber] [4:red, brown] [5:amber] Wound Margin: [1R:Flat and Intact] [4:Flat and Intact] [5:Flat and Intact] Granulation Amount: [1R:Medium (34-66%)] [4:None Present (0%)] [5:Medium (34-66%)] Granulation Quality: [1R:Red] [4:N/A] [5:Red] Necrotic Amount: [1R:Medium (34-66%)] [4:Large (67-100%)] [5:Medium (34-66%)] Necrotic Tissue: [1R:Adherent Slough] [4:Eschar, Adherent Slough] [5:Adherent Slough] Exposed Structures: Fat Layer (Subcutaneous Fat Layer (Subcutaneous Fat Layer (Subcutaneous Tissue) Exposed: Yes Tissue) Exposed: Yes Tissue) Exposed: Yes Fascia: No Fascia: No Fascia: No Tendon: No Tendon: No Tendon: No Muscle: No Muscle: No Muscle: No Joint: No Joint: No Joint: No Bone: No Bone: No Bone: No Epithelialization: None None None Periwound  Skin Texture: Excoriation: No No Abnormalities Noted Excoriation: No Induration: No Induration: No Callus: No Callus: No Crepitus: No Crepitus: No Rash: No Rash: No Scarring: No Scarring: No Periwound Skin Maceration: No Dry/Scaly: Yes Maceration: Yes Moisture: Dry/Scaly: No Dry/Scaly: No Periwound Skin Color: Erythema: Yes Erythema: Yes Erythema: Yes Atrophie Blanche: No Atrophie Blanche: No Cyanosis: No Cyanosis: No Ecchymosis: No Ecchymosis: No Hemosiderin Staining: No Hemosiderin Staining: No Mottled: No Mottled: No Pallor: No Pallor: No Rubor: No Rubor: No Erythema Location: Circumferential N/A Circumferential Temperature: Hot Hot Hot Tenderness on No No No Palpation: Wound Preparation: Ulcer Cleansing: Ulcer Cleansing: Ulcer Cleansing: Rinsed/Irrigated with Rinsed/Irrigated with Rinsed/Irrigated with Saline Saline Saline Topical Anesthetic Topical Anesthetic Topical Anesthetic Applied: None Applied: None Applied: None Wound Number: [id number]  Photos: No Photos No Photos No Photos Wound Location: Right Hand - Web Left Hand - Dorsum - Left Hand - Web Between Between 4th and 5th Digit Dorsal 1st and 2nd Digit Wounding Event: Trauma Trauma Trauma Primary Etiology: Trauma, Other Trauma, Other Trauma, Other Comorbid History: Sleep Apnea, Sleep Apnea, Sleep Apnea, Quadriplegia Quadriplegia Quadriplegia Date Acquired: 11/05/2016 11/05/2016 11/05/2016 Weeks of Treatment: 0 0 0 Wound Status: Open Open Open Wound Recurrence: No No No Measurements L x W x D 2.5x0.1x0.1 4.2x2.2x0.1 2x3x0.1 (cm) Area (cm) : 0.196 7.257 4.712 Grandt, Jasmane K. ([id number]) Volume (cm) : 0.02 0.726 0.471 % Reduction in Area: N/A N/A N/A % Reduction in Volume: N/A N/A N/A Classification: Partial Thickness Full Thickness Without Full Thickness With Exposed Support Exposed Support Structures Structures Exudate Amount: Small Large Large Exudate Type: Serous Serous Serous Exudate  Color: amber amber amber Wound Margin: Flat and Intact Flat and Intact Flat and Intact Granulation Amount: Large (67-100%) Medium (34-66%) Medium (34-66%) Granulation Quality: Red Pink Pink Necrotic Amount: Small (1-33%) Medium (34-66%) Medium (34-66%) Necrotic Tissue: Adherent Slough Adherent [employer] Exposed Structures: Fascia: No Fascia: No Fat Layer (Subcutaneous Fat Layer (Subcutaneous Fat Layer (Subcutaneous Tissue) Exposed: Yes Tissue) Exposed: No Tissue) Exposed: No Fascia: No Tendon: No Tendon: No Tendon: No Muscle: No Muscle: No Muscle: No Joint: No Joint: No Joint: No Bone: No Bone: No Bone: No Epithelialization: Large (67-100%) None None Periwound Skin Texture: Excoriation: No Excoriation: No Excoriation: No Induration: No Induration: No Induration: No Callus: No Callus: No Callus: No Crepitus: No Crepitus: No Crepitus: No Rash: No Rash: No Rash: No Scarring: No Scarring: No Scarring: No Periwound Skin Maceration: No Maceration: No Maceration: No Moisture: Dry/Scaly: No Dry/Scaly: No Dry/Scaly: No Periwound Skin Color: Atrophie Blanche: No Erythema: Yes Erythema: Yes Cyanosis: No Atrophie Blanche: No  Atrophie Blanche: No Ecchymosis: No Cyanosis: No Cyanosis: No Erythema: No Ecchymosis: No Ecchymosis: No Hemosiderin Staining: No Hemosiderin Staining: No Hemosiderin Staining: No Mottled: No Mottled: No Mottled: No Pallor: No Pallor: No Pallor: No Rubor: No Rubor: No Rubor: No Erythema Location: N/A Circumferential Circumferential Temperature: Hot Hot Hot Tenderness on No No No Palpation: Wound Preparation: Ulcer Cleansing: Ulcer Cleansing: Ulcer Cleansing: Rinsed/Irrigated with Rinsed/Irrigated with Rinsed/Irrigated with Saline Saline Saline Topical Anesthetic Topical Anesthetic Topical Anesthetic Applied: None Applied: None Applied: None Treatment Notes Carolyn OrleansBYRUM, Babe K. ([id number]) Wound #1R (Right Hand -  Web Between 1st and 2nd Digit) 1. Cleansed with: Clean wound with Normal Saline 4. Dressing Applied: Aquacel Ag Notes Conform secured with tape applied to hands. Wound #4 (Right, Lateral Hand - Dorsum) 1. Cleansed with: Clean wound with Normal Saline 4. Dressing Applied: Aquacel Ag Notes Conform secured with tape applied to hands. Wound #5 (Right, Medial Wrist) 1. Cleansed with: Clean wound with Normal Saline 4. Dressing Applied: Aquacel Ag Notes Conform secured with tape applied to hands. Wound #6 (Right Hand - Web Between 4th and 5th Digit) 1. Cleansed with: Clean wound with Normal Saline 4. Dressing Applied: Aquacel Ag Notes Conform secured with tape applied to hands. Wound #7 (Left, Dorsal Hand - Dorsum) 1. Cleansed with: Clean wound with Normal Saline 4. Dressing Applied: Aquacel Ag Notes Conform secured with tape applied to hands. Wound #8 (Left Hand - Web Between 1st and 2nd Digit) 1. Cleansed with: Clean wound with Normal Saline 4. Dressing Applied: Carolyn OrleansBYRUM, Sharunda K. ([id number]) Aquacel Ag Notes Conform secured with tape applied to hands. Electronic Signature(s) Signed: 11/05/2016 4:50:14 PM By: Baltazar Najjarobson, Michael MD Entered By: Baltazar Najjarobson, Michael on 11/05/2016 15:13:13 Carolyn OrleansBYRUM, Sidney K. ([id number]) -------------------------------------------------------------------------------- Multi-Disciplinary Care Plan Details Patient Name: Carolyn OrleansBYRUM, Ofelia K. Date of Service: 11/05/2016 2:15 PM Medical Record Patient Account Number: [id number] Number: Treating RN: Huel CoventryWoody, Kim Sep 23, 1965 ([birth year](51 y.o. Other Clinician: Date of Birth/Sex: Female) Treating ROBSON, MICHAEL Primary Care Judiann Celia: Einar CrowAnderson, Marshall Jerren Flinchbaugh/Extender: G Referring Thyra Yinger: Ardis RowanAnderson, Marshall Weeks in Treatment: 6 Active Inactive ` Soft Tissue Infection Nursing Diagnoses: Impaired tissue integrity Potential for infection: soft tissue Goals: Patient will remain free of wound  infection Date Initiated: 10/30/2016 Target Resolution Date: 11/19/2016 Goal Status: Active Interventions: Assess signs and symptoms of infection every visit Notes: ` Wound/Skin Impairment Nursing Diagnoses: Impaired tissue integrity Goals: Ulcer/skin breakdown will have a volume reduction of 30% by week 4 Date Initiated: 10/30/2016 Target Resolution Date: 11/30/2016 Goal Status: Active Interventions: Assess ulceration(s) every visit Treatment Activities: Topical wound management initiated : 10/30/2016 Notes: Carolyn OrleansBYRUM, Rielle K. ([id number]) Electronic Signature(s) Signed: 11/05/2016 5:09:21 PM By: Elliot GurneyWoody, BSN, RN, CWS, Kim RN, BSN Entered By: Elliot GurneyWoody, BSN, RN, CWS, Kim on 11/05/2016 14:47:41 Carolyn OrleansBYRUM, Andrian K. ([id number]) -------------------------------------------------------------------------------- Pain Assessment Details Patient Name: Carolyn OrleansBYRUM, Danaka K. Date of Service: 11/05/2016 2:15 PM Medical Record Patient Account Number: [id number] Number: Treating RN: Curtis SitesDorthy, Joanna Sep 23, 1965 (51 y.o. Other Clinician: Date of Birth/Sex: Female) Treating ROBSON, MICHAEL Primary Care Judithann Villamar: Einar CrowAnderson, Marshall Najee Cowens/Extender: G Referring Dorance Spink: Ardis RowanAnderson, Marshall Weeks in Treatment: 6 Active Problems Location of Pain Severity and Description of Pain Patient Has Paino Patient Unable to Respond Site Locations Pain Management and Medication Current Pain Management: Electronic Signature(s) Signed: 11/05/2016 5:02:03 PM By: Curtis Sitesorthy, Joanna Entered By: Curtis Sitesorthy, Joanna on 11/05/2016 14:28:32 Carolyn OrleansBYRUM, Cesily K. ([id number]) -------------------------------------------------------------------------------- Patient/Caregiver Education Details Patient Name: Carolyn OrleansBYRUM, Jalexus K. Date of Service: 11/05/2016 2:15 PM Medical Record Patient Account Number: [id number] Number: Treating RN: Huel CoventryWoody, Kim  08-Jul-1965 (51 y.o. Other Clinician: Date of Birth/Gender: Female)  Treating ROBSON, MICHAEL Primary Care Physician: Einar Crow Physician/Extender: G Referring Physician: Ardis Rowan in Treatment: 6 Education Assessment Education Provided To: Patient Education Topics Provided Wound/Skin Impairment: Handouts: Caring for Your Ulcer Methods: Demonstration, Explain/Verbal Responses: State content correctly Electronic Signature(s) Signed: 11/05/2016 5:09:21 PM By: Elliot Gurney, BSN, RN, CWS, Kim RN, BSN Entered By: Elliot Gurney, BSN, RN, CWS, Kim on 11/05/2016 15:01:06 [name] ([id number]) -------------------------------------------------------------------------------- Wound Assessment Details Patient Name: [name], [name]. Date of Service: 11/05/2016 2:15 PM Medical Record Patient Account Number: [id number] Number: Treating RN: Curtis Sites 13-Jul-1965 (51 y.o. Other Clinician: Date of Birth/Sex: Female) Treating ROBSON, MICHAEL Primary Care Dexter Signor: Einar Crow Owen Pratte/Extender: G Referring Bernard Slayden: Ardis Rowan in Treatment: 6 Wound Status Wound Number: 1R Primary Etiology: Trauma, Other Wound Location: Right Hand - Web Between 1st Wound Status: Open and 2nd Digit Comorbid History: Sleep Apnea, Quadriplegia Wounding Event: Trauma Date Acquired: 08/26/2016 Weeks Of Treatment: 6 Clustered Wound: No Photos Photo Uploaded By: Curtis Sites on 11/05/2016 17:00:06 Wound Measurements Length: (cm) 1 Width: (cm) 2.8 Depth: (cm) 0.1 Area: (cm) 2.199 Volume: (cm) 0.22 % Reduction in Area: -74.9% % Reduction in Volume: -74.6% Epithelialization: None Tunneling: No Undermining: No Wound Description Classification: Partial Thickness Wound Margin: Flat and Intact Exudate Amount: Medium Exudate Type: Serous Exudate Color: amber Foul Odor After Cleansing: No Slough/Fibrino No Wound Bed Granulation Amount: Medium (34-66%) Exposed Structure Granulation Quality: Red Fascia Exposed:  No Alphin, Sienna K. ([id number]) Necrotic Amount: Medium (34-66%) Fat Layer (Subcutaneous Tissue) Exposed: Yes Necrotic Quality: Adherent Slough Tendon Exposed: No Muscle Exposed: No Joint Exposed: No Bone Exposed: No Periwound Skin Texture Texture Color No Abnormalities Noted: No No Abnormalities Noted: No Callus: No Atrophie Blanche: No Crepitus: No Cyanosis: No Excoriation: No Ecchymosis: No Induration: No Erythema: Yes Rash: No Erythema Location: Circumferential Scarring: No Hemosiderin Staining: No Mottled: No Moisture Pallor: No No Abnormalities Noted: No Rubor: No Dry / Scaly: No Maceration: No Temperature / Pain Temperature: Hot Wound Preparation Ulcer Cleansing: Rinsed/Irrigated with Saline Topical Anesthetic Applied: None Treatment Notes Wound #1R (Right Hand - Web Between 1st and 2nd Digit) 1. Cleansed with: Clean wound with Normal Saline 4. Dressing Applied: Aquacel Ag Notes Conform secured with tape applied to hands. Electronic Signature(s) Signed: 11/05/2016 5:02:03 PM By: Curtis Sites Entered By: Curtis Sites on 11/05/2016 14:43:00 [name] ([id number]) -------------------------------------------------------------------------------- Wound Assessment Details Patient Name: [name]. Date of Service: 11/05/2016 2:15 PM Medical Record Patient Account Number: [id number] Number: Treating RN: Curtis Sites 02-01-1965 (51 y.o. Other Clinician: Date of Birth/Sex: Female) Treating ROBSON, MICHAEL Primary Care Yechiel Erny: Einar Crow Nataly Pacifico/Extender: G Referring Nusaiba Guallpa: Ardis Rowan in Treatment: 6 Wound Status Wound Number: 4 Primary Etiology: Trauma, Other Wound Location: Right Hand - Dorsum - Lateral Wound Status: Open Wounding Event: Bite Comorbid History: Sleep Apnea, Quadriplegia Date Acquired: 10/23/2016 Weeks Of Treatment: 0 Clustered Wound: No Photos Photo Uploaded By: Curtis Sites on 11/05/2016 17:00:07 Wound Measurements Length: (cm) 2.2 Width: (cm) 1 Depth: (cm) 0.1 Area: (cm) 1.728 Volume: (cm) 0.173 % Reduction in Area: -109.5% % Reduction in Volume: -111% Epithelialization: None Tunneling: No Undermining: No Wound Description Full Thickness Without Exposed Classification: Support Structures Wound Margin: Flat and Intact Exudate Medium Amount: Exudate Type: Serosanguineous Exudate Color: red, brown Foul Odor After Cleansing: No Slough/Fibrino No Wound Bed Granulation Amount: None Present (0%) Exposed Structure Talton, Nefertiti K. ([id number]) Necrotic Amount: Large (67-100%) Fascia Exposed: No  Necrotic Quality: Eschar, Adherent Slough Fat Layer (Subcutaneous Tissue) Exposed: Yes Tendon Exposed: No Muscle Exposed: No Joint Exposed: No Bone Exposed: No Periwound Skin Texture Texture Color No Abnormalities Noted: No No Abnormalities Noted: No Erythema: Yes Moisture No Abnormalities Noted: No Temperature / Pain Dry / Scaly: Yes Temperature: Hot Wound Preparation Ulcer Cleansing: Rinsed/Irrigated with Saline Topical Anesthetic Applied: None Treatment Notes Wound #4 (Right, Lateral Hand - Dorsum) 1. Cleansed with: Clean wound with Normal Saline 4. Dressing Applied: Aquacel Ag Notes Conform secured with tape applied to hands. Electronic Signature(s) Signed: 11/05/2016 5:02:03 PM By: Curtis Sites Entered By: Curtis Sites on 11/05/2016 14:43:21 Coreas, [name] ([id number]) -------------------------------------------------------------------------------- Wound Assessment Details Patient Name: [name]. Date of Service: 11/05/2016 2:15 PM Medical Record Patient Account Number: [id number] Number: Treating RN: Curtis Sites 01-Jun-1965 (51 y.o. Other Clinician: Date of Birth/Sex: Female) Treating ROBSON, MICHAEL Primary Care Kimbree Casanas: Einar Crow Nussen Pullin/Extender: G Referring Jakaleb Payer:  Ardis Rowan in Treatment: 6 Wound Status Wound Number: 5 Primary Etiology: Trauma, Other Wound Location: Right Wrist - Medial Wound Status: Open Wounding Event: Trauma Comorbid History: Sleep Apnea, Quadriplegia Date Acquired: 11/04/2016 Weeks Of Treatment: 0 Clustered Wound: No Photos Photo Uploaded By: Curtis Sites on 11/05/2016 17:00:41 Wound Measurements Length: (cm) 4.2 Width: (cm) 2.5 Depth: (cm) 0.1 Area: (cm) 8.247 Volume: (cm) 0.825 % Reduction in Area: % Reduction in Volume: Epithelialization: None Tunneling: No Undermining: No Wound Description Full Thickness Without Exposed Classification: Support Structures Wound Margin: Flat and Intact Exudate Large Amount: Exudate Type: Serous Exudate Color: amber Foul Odor After Cleansing: No Slough/Fibrino Yes Wound Bed Granulation Amount: Medium (34-66%) Exposed Structure Toothman, Shanetra K. ([id number]) Granulation Quality: Red Fascia Exposed: No Necrotic Amount: Medium (34-66%) Fat Layer (Subcutaneous Tissue) Exposed: Yes Necrotic Quality: Adherent Slough Tendon Exposed: No Muscle Exposed: No Joint Exposed: No Bone Exposed: No Periwound Skin Texture Texture Color No Abnormalities Noted: No No Abnormalities Noted: No Callus: No Atrophie Blanche: No Crepitus: No Cyanosis: No Excoriation: No Ecchymosis: No Induration: No Erythema: Yes Rash: No Erythema Location: Circumferential Scarring: No Hemosiderin Staining: No Mottled: No Moisture Pallor: No No Abnormalities Noted: No Rubor: No Dry / Scaly: No Maceration: Yes Temperature / Pain Temperature: Hot Wound Preparation Ulcer Cleansing: Rinsed/Irrigated with Saline Topical Anesthetic Applied: None Treatment Notes Wound #5 (Right, Medial Wrist) 1. Cleansed with: Clean wound with Normal Saline 4. Dressing Applied: Aquacel Ag Notes Conform secured with tape applied to hands. Electronic Signature(s) Signed: 11/05/2016  5:02:03 PM By: Curtis Sites Entered By: Curtis Sites on 11/05/2016 14:42:06 [name] ([id number]) -------------------------------------------------------------------------------- Wound Assessment Details Patient Name: [name]. Date of Service: 11/05/2016 2:15 PM Medical Record Patient Account Number: [id number] Number: Treating RN: Curtis Sites 09-03-1965 (51 y.o. Other Clinician: Date of Birth/Sex: Female) Treating ROBSON, MICHAEL Primary Care Christell Steinmiller: Einar Crow Ahonesty Woodfin/Extender: G Referring Reeya Bound: Ardis Rowan in Treatment: 6 Wound Status Wound Number: 6 Primary Etiology: Trauma, Other Wound Location: Right Hand - Web Between 4th Wound Status: Open and 5th Digit Comorbid History: Sleep Apnea, Quadriplegia Wounding Event: Trauma Date Acquired: 11/05/2016 Weeks Of Treatment: 0 Clustered Wound: No Photos Photo Uploaded By: Curtis Sites on 11/05/2016 17:00:41 Wound Measurements Length: (cm) 2.5 Width: (cm) 0.1 Depth: (cm) 0.1 Area: (cm) 0.196 Volume: (cm) 0.02 % Reduction in Area: % Reduction in Volume: Epithelialization: Large (67-100%) Tunneling: No Undermining: No Wound Description Classification: Partial Thickness Wound Margin: Flat and Intact Exudate Amount: Small Exudate Type: Serous Exudate Color: amber Foul Odor After Cleansing: No  Slough/Fibrino Yes Wound Bed Granulation Amount: Large (67-100%) Exposed Structure Granulation Quality: Red Fascia Exposed: No Callan, Aubriella K. ([id number]) Necrotic Amount: Small (1-33%) Fat Layer (Subcutaneous Tissue) Exposed: No Necrotic Quality: Adherent Slough Tendon Exposed: No Muscle Exposed: No Joint Exposed: No Bone Exposed: No Periwound Skin Texture Texture Color No Abnormalities Noted: No No Abnormalities Noted: No Callus: No Atrophie Blanche: No Crepitus: No Cyanosis: No Excoriation: No Ecchymosis: No Induration: No Erythema: No Rash:  No Hemosiderin Staining: No Scarring: No Mottled: No Pallor: No Moisture Rubor: No No Abnormalities Noted: No Dry / Scaly: No Temperature / Pain Maceration: No Temperature: Hot Wound Preparation Ulcer Cleansing: Rinsed/Irrigated with Saline Topical Anesthetic Applied: None Treatment Notes Wound #6 (Right Hand - Web Between 4th and 5th Digit) 1. Cleansed with: Clean wound with Normal Saline 4. Dressing Applied: Aquacel Ag Notes Conform secured with tape applied to hands. Electronic Signature(s) Signed: 11/05/2016 2:45:15 PM By: Curtis Sites Entered By: Curtis Sites on 11/05/2016 14:45:15 [name] ([id number]) -------------------------------------------------------------------------------- Wound Assessment Details Patient Name: [name]. Date of Service: 11/05/2016 2:15 PM Medical Record Patient Account Number: [id number] Number: Treating RN: Curtis Sites December 22, 1965 (51 y.o. Other Clinician: Date of Birth/Sex: Female) Treating ROBSON, MICHAEL Primary Care Tel Hevia: Einar Crow Baxter Gonzalez/Extender: G Referring Jolayne Branson: Ardis Rowan in Treatment: 6 Wound Status Wound Number: 7 Primary Etiology: Trauma, Other Wound Location: Left Hand - Dorsum - Dorsal Wound Status: Open Wounding Event: Trauma Comorbid History: Sleep Apnea, Quadriplegia Date Acquired: 11/05/2016 Weeks Of Treatment: 0 Clustered Wound: No Photos Photo Uploaded By: Curtis Sites on 11/05/2016 17:01:19 Wound Measurements Length: (cm) 4.2 Width: (cm) 2.2 Depth: (cm) 0.1 Area: (cm) 7.257 Volume: (cm) 0.726 % Reduction in Area: % Reduction in Volume: Epithelialization: None Tunneling: No Undermining: No Wound Description Full Thickness Without Exposed Classification: Support Structures Wound Margin: Flat and Intact Exudate Large Amount: Exudate Type: Serous Exudate Color: amber Foul Odor After Cleansing: No Slough/Fibrino Yes Wound  Bed Granulation Amount: Medium (34-66%) Exposed Structure Halpin, Alayshia K. ([id number]) Granulation Quality: Pink Fascia Exposed: No Necrotic Amount: Medium (34-66%) Fat Layer (Subcutaneous Tissue) Exposed: No Necrotic Quality: Adherent Slough Tendon Exposed: No Muscle Exposed: No Joint Exposed: No Bone Exposed: No Periwound Skin Texture Texture Color No Abnormalities Noted: No No Abnormalities Noted: No Callus: No Atrophie Blanche: No Crepitus: No Cyanosis: No Excoriation: No Ecchymosis: No Induration: No Erythema: Yes Rash: No Erythema Location: Circumferential Scarring: No Hemosiderin Staining: No Mottled: No Moisture Pallor: No No Abnormalities Noted: No Rubor: No Dry / Scaly: No Maceration: No Temperature / Pain Temperature: Hot Wound Preparation Ulcer Cleansing: Rinsed/Irrigated with Saline Topical Anesthetic Applied: None Treatment Notes Wound #7 (Left, Dorsal Hand - Dorsum) 1. Cleansed with: Clean wound with Normal Saline 4. Dressing Applied: Aquacel Ag Notes Conform secured with tape applied to hands. Electronic Signature(s) Signed: 11/05/2016 2:46:17 PM By: Curtis Sites Entered By: Curtis Sites on 11/05/2016 14:46:17 Therrell, [name] ([id number]) -------------------------------------------------------------------------------- Wound Assessment Details Patient Name: [name]. Date of Service: 11/05/2016 2:15 PM Medical Record Patient Account Number: [id number] Number: Treating RN: Curtis Sites 1965/02/18 (51 y.o. Other Clinician: Date of Birth/Sex: Female) Treating ROBSON, MICHAEL Primary Care Desiderio Dolata: Einar Crow Sarahann Horrell/Extender: G Referring Lee Kalt: Ardis Rowan in Treatment: 6 Wound Status Wound Number: 8 Primary Etiology: Trauma, Other Wound Location: Left Hand - Web Between 1st and Wound Status: Open 2nd Digit Comorbid History: Sleep Apnea, Quadriplegia Wounding Event: Trauma Date  Acquired: 11/05/2016 Weeks Of Treatment: 0 Clustered Wound: No Photos Photo Uploaded  By: Curtis Sites on 11/05/2016 17:01:19 Wound Measurements Length: (cm) 2 Width: (cm) 3 Depth: (cm) 0.1 Area: (cm) 4.712 Volume: (cm) 0.471 % Reduction in Area: % Reduction in Volume: Epithelialization: None Tunneling: No Undermining: No Wound Description Full Thickness With Exposed Classification: Support Structures Wound Margin: Flat and Intact Exudate Large Amount: Exudate Type: Serous Exudate Color: amber Foul Odor After Cleansing: No Slough/Fibrino Yes Wound Bed Schwoerer, Rakayla K. ([id number]) Granulation Amount: Medium (34-66%) Exposed Structure Granulation Quality: Pink Fascia Exposed: No Necrotic Amount: Medium (34-66%) Fat Layer (Subcutaneous Tissue) Exposed: Yes Necrotic Quality: Adherent Slough Tendon Exposed: No Muscle Exposed: No Joint Exposed: No Bone Exposed: No Periwound Skin Texture Texture Color No Abnormalities Noted: No No Abnormalities Noted: No Callus: No Atrophie Blanche: No Crepitus: No Cyanosis: No Excoriation: No Ecchymosis: No Induration: No Erythema: Yes Rash: No Erythema Location: Circumferential Scarring: No Hemosiderin Staining: No Mottled: No Moisture Pallor: No No Abnormalities Noted: No Rubor: No Dry / Scaly: No Maceration: No Temperature / Pain Temperature: Hot Wound Preparation Ulcer Cleansing: Rinsed/Irrigated with Saline Topical Anesthetic Applied: None Treatment Notes Wound #8 (Left Hand - Web Between 1st and 2nd Digit) 1. Cleansed with: Clean wound with Normal Saline 4. Dressing Applied: Aquacel Ag Notes Conform secured with tape applied to hands. Electronic Signature(s) Signed: 11/05/2016 2:47:11 PM By: Curtis Sites Entered By: Curtis Sites on 11/05/2016 14:47:11 Sweney, [name] ([id number]) -------------------------------------------------------------------------------- Vitals Details Patient Name:  [name]. Date of Service: 11/05/2016 2:15 PM Medical Record Patient Account Number: [id number] Number: Treating RN: Curtis Sites Oct 06, 1965 (51 y.o. Other Clinician: Date of Birth/Sex: Female) Treating ROBSON, MICHAEL Primary Care Soul Deveney: Einar Crow Daylan Boggess/Extender: G Referring Madelin Weseman: Ardis Rowan in Treatment: 6 Vital Signs Time Taken: 14:32 Temperature (F): 98.3 Pulse (bpm): 112 Respiratory Rate (breaths/min): 16 Blood Pressure (mmHg): 105/83 Reference Range: 80 - 120 mg / dl Electronic Signature(s) Signed: 11/05/2016 5:02:03 PM By: Curtis Sites Entered By: Curtis Sites on 11/05/2016 14:32:53

## 2016-11-09 LAB — AEROBIC CULTURE  (SUPERFICIAL SPECIMEN)

## 2016-11-09 LAB — AEROBIC CULTURE W GRAM STAIN (SUPERFICIAL SPECIMEN)

## 2016-11-13 ENCOUNTER — Encounter: Payer: Medicare Other | Admitting: Internal Medicine

## 2016-11-13 DIAGNOSIS — S60571D Other superficial bite of hand of right hand, subsequent encounter: Secondary | ICD-10-CM | POA: Diagnosis not present

## 2016-11-15 NOTE — Progress Notes (Addendum)
[name], [name] ([id number]) Visit Report for 11/13/2016 Arrival Information Details Patient Name: [name], [name]. Date of Service: 11/13/2016 10:15 AM Medical Record Number: [id number] Patient Account Number: [id number] Date of Birth/Sex: 1965-08-23 (51 y.o. Female) Treating RN: Huel Coventry Primary Care Goble Fudala: Einar Crow Other Clinician: Referring Lyndell Gillyard: Einar Crow Treating Seichi Kaufhold/Extender: Altamese [place] in Treatment: 8 Visit Information History Since Last Visit Added or deleted any medications: No Patient Arrived: Wheel Chair Any new allergies or adverse reactions: No Arrival Time: 10:27 Had a fall or experienced change in No Accompanied By: care giver activities of daily living that may affect Transfer Assistance: None risk of falls: Patient Identification Verified: Yes Hospitalized since last visit: No Secondary Verification Process Completed: Yes Pain Present Now: No Patient Has Alerts: Yes Electronic Signature(s) Signed: 11/13/2016 5:14:29 PM By: Elliot Gurney, BSN, RN, CWS, Kim RN, BSN Entered By: Elliot Gurney, BSN, RN, CWS, Kim on 11/13/2016 10:29:36 [name] ([id number]) -------------------------------------------------------------------------------- Clinic Level of Care Assessment Details Patient Name: [name]. Date of Service: 11/13/2016 10:15 AM Medical Record Number: [id number] Patient Account Number: [id number] Date of Birth/Sex: 22-May-1965 (51 y.o. Female) Treating RN: Huel Coventry Primary Care Aidenjames Heckmann: Einar Crow Other Clinician: Referring Annastacia Duba: Einar Crow Treating Jericca Russett/Extender: Altamese [place] in Treatment: 8 Clinic Level of Care Assessment Items TOOL 4 Quantity Score []  - Use when only an EandM is performed on FOLLOW-UP visit 0 ASSESSMENTS - Nursing Assessment / Reassessment []  - Reassessment of Co-morbidities (includes updates in patient status) 0 X- 1 5 Reassessment of Adherence  to Treatment Plan ASSESSMENTS - Wound and Skin Assessment / Reassessment []  - Simple Wound Assessment / Reassessment - one wound 0 X- 5 5 Complex Wound Assessment / Reassessment - multiple wounds []  - 0 Dermatologic / Skin Assessment (not related to wound area) ASSESSMENTS - Focused Assessment []  - Circumferential Edema Measurements - multi extremities 0 []  - 0 Nutritional Assessment / Counseling / Intervention []  - 0 Lower Extremity Assessment (monofilament, tuning fork, pulses) []  - 0 Peripheral Arterial Disease Assessment (using hand held doppler) ASSESSMENTS - Ostomy and/or Continence Assessment and Care []  - Incontinence Assessment and Management 0 []  - 0 Ostomy Care Assessment and Management (repouching, etc.) PROCESS - Coordination of Care []  - Simple Patient / Family Education for ongoing care 0 X- 1 20 Complex (extensive) Patient / Family Education for ongoing care X- 1 10 Staff obtains Chiropractor, Records, Test Results / Process Orders []  - 0 Staff telephones HHA, Nursing Homes / Clarify orders / etc []  - 0 Routine Transfer to another Facility (non-emergent condition) []  - 0 Routine Hospital Admission (non-emergent condition) []  - 0 New Admissions / Manufacturing engineer / Ordering NPWT, Apligraf, etc. []  - 0 Emergency Hospital Admission (emergent condition) X- 1 10 Simple Discharge Coordination [name], [name]. ([id number]) []  - 0 Complex (extensive) Discharge Coordination PROCESS - Special Needs []  - Pediatric / Minor Patient Management 0 []  - 0 Isolation Patient Management []  - 0 Hearing / Language / Visual special needs []  - 0 Assessment of Community assistance (transportation, D/C planning, etc.) []  - 0 Additional assistance / Altered mentation []  - 0 Support Surface(s) Assessment (bed, cushion, seat, etc.) INTERVENTIONS - Wound Cleansing / Measurement []  - Simple Wound Cleansing - one wound 0 X- 5 5 Complex Wound Cleansing - multiple wounds X-  1 5 Wound Imaging (photographs - any number of wounds) []  - 0 Wound Tracing (instead of photographs) []  - 0 Simple Wound Measurement - one wound X- 5  5 Complex Wound Measurement - multiple wounds INTERVENTIONS - Wound Dressings []  - Small Wound Dressing one or multiple wounds 0 X- 2 15 Medium Wound Dressing one or multiple wounds []  - 0 Large Wound Dressing one or multiple wounds []  - 0 Application of Medications - topical []  - 0 Application of Medications - injection INTERVENTIONS - Miscellaneous []  - External ear exam 0 []  - 0 Specimen Collection (cultures, biopsies, blood, body fluids, etc.) []  - 0 Specimen(s) / Culture(s) sent or taken to Lab for analysis []  - 0 Patient Transfer (multiple staff / Nurse, adult / Similar devices) []  - 0 Simple Staple / Suture removal (25 or less) []  - 0 Complex Staple / Suture removal (26 or more) []  - 0 Hypo / Hyperglycemic Management (close monitor of Blood Glucose) []  - 0 Ankle / Brachial Index (ABI) - do not check if billed separately X- 1 5 Vital Signs Lambson, Kristinia K. ([id number]) Has the patient been seen at the hospital within the last three years: Yes Total Score: 160 Level Of Care: New/Established - Level 5 Electronic Signature(s) Signed: 11/13/2016 5:14:29 PM By: Elliot Gurney, BSN, RN, CWS, Kim RN, BSN Entered By: Elliot Gurney, BSN, RN, CWS, Kim on 11/13/2016 11:00:48 [name] ([id number]) -------------------------------------------------------------------------------- Encounter Discharge Information Details Patient Name: [name]. Date of Service: 11/13/2016 10:15 AM Medical Record Number: [id number] Patient Account Number: [id number] Date of Birth/Sex: [birth year] (51 y.o. Female) Treating RN: Huel Coventry Primary Care Nakea Gouger: Einar Crow Other Clinician: Referring Brison Fiumara: Einar Crow Treating Alban Marucci/Extender: Altamese [place] in Treatment: 8 Encounter Discharge Information  Items Discharge Pain Level: 0 Discharge Condition: Stable Ambulatory Status: Wheelchair Discharge Destination: Home Transportation: Private Auto Accompanied By: caregiver Schedule Follow-up Appointment: Yes Medication Reconciliation completed and Yes provided to Patient/Care Josealberto Montalto: Provided on Clinical Summary of Care: 11/13/2016 Form Type Recipient Paper Patient MB Electronic Signature(s) Signed: 11/13/2016 11:03:02 AM By: Francie Massing Entered By: Francie Massing on 11/13/2016 11:03:01 [name] ([id number]) -------------------------------------------------------------------------------- Lower Extremity Assessment Details Patient Name: [name]. Date of Service: 11/13/2016 10:15 AM Medical Record Number: [id number] Patient Account Number: [id number] Date of Birth/Sex: Jul 28, 1965 (51 y.o. Female) Treating RN: Huel Coventry Primary Care Navy Rothschild: Einar Crow Other Clinician: Referring Larine Fielding: Einar Crow Treating Kyliah Deanda/Extender: Altamese [place] in Treatment: 8 Electronic Signature(s) Signed: 11/13/2016 5:14:29 PM By: Elliot Gurney, BSN, RN, CWS, Kim RN, BSN Entered By: Elliot Gurney, BSN, RN, CWS, Kim on 11/13/2016 10:58:13 [name] ([id number]) -------------------------------------------------------------------------------- Multi Wound Chart Details Patient Name: [name]. Date of Service: 11/13/2016 10:15 AM Medical Record Number: [id number] Patient Account Number: [id number] Date of Birth/Sex: [birth year] (51 y.o. Female) Treating RN: Huel Coventry Primary Care Shamar Kracke: Einar Crow Other Clinician: Referring Aaron Bostwick: Einar Crow Treating Lenorris Karger/Extender: Altamese  in Treatment: 8 Vital Signs Height(in): Pulse(bpm): 114 Weight(lbs): Blood Pressure(mmHg): 129/104 Body Mass Index(BMI): Temperature(F): Respiratory Rate 20 (breaths/min): Photos: [5:No Photos] Wound Location: Right Hand - Web Between  1st Right Hand - Dorsum - Lateral Right Wrist - Medial and 2nd Digit Wounding Event: Trauma Bite Trauma Primary Etiology: Trauma, Other Trauma, Other Trauma, Other Comorbid History: Sleep Apnea, Quadriplegia Sleep Apnea, Quadriplegia Sleep Apnea, Quadriplegia Date Acquired: 08/26/2016 10/23/2016 11/04/2016 Weeks of Treatment: 8 2 1  Wound Status: Open Open Open Wound Recurrence: Yes No No Measurements L x W x D 1.5x1.5x0.3 0.8x1x0.1 2.7x2.2x0.1 (cm) Area (cm) : 1.767 0.628 4.665 Volume (cm) : 0.53 0.063 0.467 % Reduction in Area: -40.60% 23.90% 43.40% % Reduction in Volume: -320.60% 23.20% 43.40% Classification:  Partial Thickness Full Thickness Without Full Thickness Without Exposed Support Structures Exposed Support Structures Exudate Amount: Medium Medium Large Exudate Type: Serous Serosanguineous Serous Exudate Color: amber red, brown amber Wound Margin: Flat and Intact Flat and Intact Flat and Intact Granulation Amount: None Present (0%) None Present (0%) Medium (34-66%) Granulation Quality: N/A N/A Red Necrotic Amount: Large (67-100%) Large (67-100%) Medium (34-66%) Necrotic Tissue: Adherent Slough Eschar, Adherent Slough Adherent [employer] Exposed Structures: Fat Layer (Subcutaneous Fascia: No Fat Layer (Subcutaneous Tissue) Exposed: Yes Fat Layer (Subcutaneous Tissue) Exposed: Yes Fascia: No Tissue) Exposed: No Fascia: No Tendon: No Tendon: No Tendon: No Muscle: No Muscle: No Muscle: No Ballweg, [name] ([id number]) Joint: No Joint: No Joint: No Bone: No Bone: No Bone: No Epithelialization: None None None Periwound Skin Texture: Excoriation: No No Abnormalities Noted Excoriation: No Induration: No Induration: No Callus: No Callus: No Crepitus: No Crepitus: No Rash: No Rash: No Scarring: No Scarring: No Periwound Skin Moisture: Maceration: No Dry/Scaly: Yes Maceration: Yes Dry/Scaly: No Dry/Scaly: No Periwound Skin Color: Atrophie Blanche:  No Erythema: Yes Erythema: Yes Cyanosis: No Atrophie Blanche: No Ecchymosis: No Cyanosis: No Erythema: No Ecchymosis: No Hemosiderin Staining: No Hemosiderin Staining: No Mottled: No Mottled: No Pallor: No Pallor: No Rubor: No Rubor: No Erythema Location: N/A N/A Circumferential Temperature: Hot Hot Hot Tenderness on Palpation: No No No Wound Preparation: Ulcer Cleansing: Ulcer Cleansing: Ulcer Cleansing: Rinsed/Irrigated with Saline Rinsed/Irrigated with Saline Rinsed/Irrigated with Saline Topical Anesthetic Applied: Topical Anesthetic Applied: Topical Anesthetic Applied: None, Other: Lidocaine 4% None, Other: Lidocaine4% None, Other: Lidocaine4% Wound Number: [id number]  Photos: No Photos No Photos No Photos Wound Location: Right Hand - Web Between Left Hand - Dorsum - Dorsal Left Hand - Web Between 1st 4th and 5th Digit and 2nd Digit Wounding Event: Trauma Trauma Trauma Primary Etiology: Trauma, Other Trauma, Other Trauma, Other Comorbid History: Sleep Apnea, Quadriplegia Sleep Apnea, Quadriplegia Sleep Apnea, Quadriplegia Date Acquired: 11/05/2016 11/05/2016 11/05/2016 Weeks of Treatment: 1 1 1  Wound Status: Open Open Open Wound Recurrence: No No No Measurements L x W x D 1.5x0.1x0.1 1.2x1x0.1 1x0.7x0.1 (cm) Area (cm) : 0.118 0.942 0.55 Volume (cm) : 0.012 0.094 0.055 % Reduction in Area: 39.80% 87.00% 88.30% % Reduction in Volume: 40.00% 87.10% 88.30% Classification: Partial Thickness Full Thickness Without Full Thickness With Exposed Exposed Support Structures Support Structures Exudate Amount: Small Large Large Exudate Type: Serous Serous Serous Exudate Color: amber amber amber Wound Margin: Flat and Intact Flat and Intact Flat and Intact Granulation Amount: None Present (0%) None Present (0%) None Present (0%) Granulation Quality: N/A N/A N/A Necrotic Amount: Large (67-100%) Large (67-100%) Large (67-100%) Necrotic Tissue: Eschar, Adherent Slough Eschar,  Adherent Slough Eschar, Adherent Slough Exposed Structures: Fascia: No Fascia: No Fat Layer (Subcutaneous Fat Layer (Subcutaneous Fat Layer (Subcutaneous Tissue) Exposed: Yes Sneeringer, [name] ([id number]) Tissue) Exposed: No Tissue) Exposed: No Fascia: No Tendon: No Tendon: No Tendon: No Muscle: No Muscle: No Muscle: No Joint: No Joint: No Joint: No Bone: No Bone: No Bone: No Epithelialization: Large (67-100%) None None Periwound Skin Texture: Excoriation: No Excoriation: No Excoriation: No Induration: No Induration: No Induration: No Callus: No Callus: No Callus: No Crepitus: No Crepitus: No Crepitus: No Rash: No Rash: No Rash: No Scarring: No Scarring: No Scarring: No Periwound Skin Moisture: Maceration: No Maceration: No Maceration: No Dry/Scaly: No Dry/Scaly: No Dry/Scaly: No Periwound Skin Color: Atrophie Blanche: No Erythema: Yes Atrophie Blanche: No Cyanosis: No Atrophie Blanche: No Cyanosis: No Ecchymosis: No Cyanosis: No Ecchymosis: No  Erythema: No Ecchymosis: No Erythema: No Hemosiderin Staining: No Hemosiderin Staining: No Hemosiderin Staining: No Mottled: No Mottled: No Mottled: No Pallor: No Pallor: No Pallor: No Rubor: No Rubor: No Rubor: No Erythema Location: N/A Circumferential N/A Temperature: Hot Hot Hot Tenderness on Palpation: No No No Wound Preparation: Ulcer Cleansing: Ulcer Cleansing: Ulcer Cleansing: Rinsed/Irrigated with Saline Rinsed/Irrigated with Saline Rinsed/Irrigated with Saline Topical Anesthetic Applied: Topical Anesthetic Applied: Topical Anesthetic Applied: None None, Other: Lidocaine4% None, Other: Lidocaine4% Treatment Notes Wound #1R (Right Hand - Web Between 1st and 2nd Digit) 1. Cleansed with: Clean wound with Normal Saline 4. Dressing Applied: Aquacel Ag Notes Conform secured with tape applied to hands. Wound #4 (Right, Lateral Hand - Dorsum) 1. Cleansed with: Clean wound with  Normal Saline 4. Dressing Applied: Aquacel Ag Notes Conform secured with tape applied to hands. Wound #5 (Right, Medial Wrist) 1. Cleansed with: Clean wound with Normal Saline 4. Dressing Applied: Aquacel Ag Notes Hibner, [name] ([id number]) Conform secured with tape applied to hands. Wound #6 (Right Hand - Web Between 4th and 5th Digit) 1. Cleansed with: Clean wound with Normal Saline 4. Dressing Applied: Aquacel Ag Notes Conform secured with tape applied to hands. Wound #7 (Left, Dorsal Hand - Dorsum) 1. Cleansed with: Clean wound with Normal Saline 4. Dressing Applied: Aquacel Ag Notes Conform secured with tape applied to hands. Wound #8 (Left Hand - Web Between 1st and 2nd Digit) 1. Cleansed with: Clean wound with Normal Saline 4. Dressing Applied: Aquacel Ag Notes Conform secured with tape applied to hands. Electronic Signature(s) Signed: 11/13/2016 5:28:55 PM By: Baltazar Najjar MD Entered By: Baltazar Najjar on 11/13/2016 11:27:17 [name] ([id number]) -------------------------------------------------------------------------------- Multi-Disciplinary Care Plan Details Patient Name: FELINA, TELLO. Date of Service: 11/13/2016 10:15 AM Medical Record Number: [id number] Patient Account Number: [id number] Date of Birth/Sex: [birth year] (51 y.o. Female) Treating RN: Huel Coventry Primary Care Arietta Eisenstein: Einar Crow Other Clinician: Referring Knight Oelkers: Einar Crow Treating Azhar Yogi/Extender: Altamese [place] in Treatment: 8 Active Inactive ` Soft Tissue Infection Nursing Diagnoses: Impaired tissue integrity Potential for infection: soft tissue Goals: Patient will remain free of wound infection Date Initiated: 10/30/2016 Target Resolution Date: 11/19/2016 Goal Status: Active Interventions: Assess signs and symptoms of infection every visit Notes: ` Wound/Skin Impairment Nursing Diagnoses: Impaired tissue  integrity Goals: Ulcer/skin breakdown will have a volume reduction of 30% by week 4 Date Initiated: 10/30/2016 Target Resolution Date: 11/30/2016 Goal Status: Active Interventions: Assess ulceration(s) every visit Treatment Activities: Topical wound management initiated : 10/30/2016 Notes: Electronic Signature(s) Signed: 11/13/2016 5:14:29 PM By: Elliot Gurney, BSN, RN, CWS, Kim RN, BSN Entered By: Elliot Gurney, BSN, RN, CWS, Kim on 11/13/2016 10:58:18 [name] ([id number]) -------------------------------------------------------------------------------- Pain Assessment Details Patient Name: [name]. Date of Service: 11/13/2016 10:15 AM Medical Record Number: [id number] Patient Account Number: [id number] Date of Birth/Sex: 11/22/1965 (51 y.o. Female) Treating RN: Huel Coventry Primary Care Florie Carico: Einar Crow Other Clinician: Referring Coline Calkin: Einar Crow Treating Landon Truax/Extender: Altamese [place] in Treatment: 8 Active Problems Location of Pain Severity and Description of Pain Patient Has Paino Patient Unable to Respond Site Locations Pain Management and Medication Current Pain Management: Electronic Signature(s) Signed: 11/13/2016 5:14:29 PM By: Elliot Gurney, BSN, RN, CWS, Kim RN, BSN Entered By: Elliot Gurney, BSN, RN, CWS, Kim on 11/13/2016 10:58:04 [name] ([id number]) -------------------------------------------------------------------------------- Patient/Caregiver Education Details Patient Name: [name]. Date of Service: 11/13/2016 10:15 AM Medical Record Number: [id number] Patient Account Number: [id number] Date of Birth/Gender: [birth year] (51 y.o. Female) Treating RN:  Huel Coventry Primary Care Physician: Einar Crow Other Clinician: Referring Physician: Einar Crow Treating Physician/Extender: Altamese [place] in Treatment: 8 Education Assessment Education Provided To: Patient Education Topics  Provided Wound/Skin Impairment: Handouts: Caring for Your Ulcer Methods: Demonstration, Explain/Verbal Responses: State content correctly Electronic Signature(s) Signed: 11/13/2016 5:14:29 PM By: Elliot Gurney, BSN, RN, CWS, Kim RN, BSN Entered By: Elliot Gurney, BSN, RN, CWS, Kim on 11/13/2016 11:02:04 [name] ([id number]) -------------------------------------------------------------------------------- Wound Assessment Details Patient Name: JANY, BUCKWALTER. Date of Service: 11/13/2016 10:15 AM Medical Record Number: [id number] Patient Account Number: [id number] Date of Birth/Sex: 13-Nov-1965 (51 y.o. Female) Treating RN: Huel Coventry Primary Care Efton Thomley: Einar Crow Other Clinician: Referring Abbegale Stehle: Einar Crow Treating Clemmie Buelna/Extender: Altamese [place] in Treatment: 8 Wound Status Wound Number: 1R Primary Etiology: Trauma, Other Wound Location: Right Hand - Web Between 1st and 2nd Wound Status: Open Digit Comorbid History: Sleep Apnea, Quadriplegia Wounding Event: Trauma Date Acquired: 08/26/2016 Weeks Of Treatment: 8 Clustered Wound: No Photos Wound Measurements Length: (cm) 1.5 Width: (cm) 1.5 Depth: (cm) 0.3 Area: (cm) 1.767 Volume: (cm) 0.53 % Reduction in Area: -40.6% % Reduction in Volume: -320.6% Epithelialization: None Tunneling: No Undermining: No Wound Description Classification: Partial Thickness Foul Od Wound Margin: Flat and Intact Slough/ Exudate Amount: Medium Exudate Type: Serous Exudate Color: amber or After Cleansing: No Fibrino No Wound Bed Granulation Amount: None Present (0%) Exposed Structure Necrotic Amount: Large (67-100%) Fascia Exposed: No Necrotic Quality: Adherent Slough Fat Layer (Subcutaneous Tissue) Exposed: Yes Tendon Exposed: No Muscle Exposed: No Joint Exposed: No Bone Exposed: No Periwound Skin Texture Texture Color No Abnormalities Noted: No No Abnormalities Noted: No Callus: No Atrophie  Blanche: No Giarrusso, Mariza K. ([id number]) Crepitus: No Cyanosis: No Excoriation: No Ecchymosis: No Induration: No Erythema: No Rash: No Hemosiderin Staining: No Scarring: No Mottled: No Pallor: No Moisture Rubor: No No Abnormalities Noted: No Dry / Scaly: No Temperature / Pain Maceration: No Temperature: Hot Wound Preparation Ulcer Cleansing: Rinsed/Irrigated with Saline Topical Anesthetic Applied: None, Other: Lidocaine 4%, Treatment Notes Wound #1R (Right Hand - Web Between 1st and 2nd Digit) 1. Cleansed with: Clean wound with Normal Saline 4. Dressing Applied: Aquacel Ag Notes Conform secured with tape applied to hands. Electronic Signature(s) Signed: 11/13/2016 5:14:29 PM By: Elliot Gurney, BSN, RN, CWS, Kim RN, BSN Entered By: Elliot Gurney, BSN, RN, CWS, Kim on 11/13/2016 10:39:52 [name] ([id number]) -------------------------------------------------------------------------------- Wound Assessment Details Patient Name: ANILA, BOJARSKI. Date of Service: 11/13/2016 10:15 AM Medical Record Number: [id number] Patient Account Number: [id number] Date of Birth/Sex: [birth year] (51 y.o. Female) Treating RN: Huel Coventry Primary Care Giovannina Mun: Einar Crow Other Clinician: Referring Naava Janeway: Einar Crow Treating Mireyah Chervenak/Extender: Altamese  in Treatment: 8 Wound Status Wound Number: 4 Primary Etiology: Trauma, Other Wound Location: Right Hand - Dorsum - Lateral Wound Status: Open Wounding Event: Bite Comorbid History: Sleep Apnea, Quadriplegia Date Acquired: 10/23/2016 Weeks Of Treatment: 2 Clustered Wound: No Photos Wound Measurements Length: (cm) 0.8 Width: (cm) 1 Depth: (cm) 0.1 Area: (cm) 0.628 Volume: (cm) 0.063 % Reduction in Area: 23.9% % Reduction in Volume: 23.2% Epithelialization: None Tunneling: No Undermining: No Wound Description Full Thickness Without Exposed Support Foul O Classification: Structures  Slough Wound Margin: Flat and Intact Exudate Medium Amount: Exudate Type: Serosanguineous Exudate Color: red, brown dor After Cleansing: No /Fibrino No Wound Bed Granulation Amount: None Present (0%) Exposed Structure Necrotic Amount: Large (67-100%) Fascia Exposed: No Necrotic Quality: Eschar, Adherent Slough Fat Layer (Subcutaneous Tissue) Exposed: No Tendon Exposed: No Muscle  Exposed: No Joint Exposed: No Bone Exposed: No Periwound Skin Texture Texture Color No Abnormalities Noted: No No Abnormalities Noted: No Zabriskie, [name] ([id number]) Moisture Erythema: Yes No Abnormalities Noted: No Temperature / Pain Dry / Scaly: Yes Temperature: Hot Wound Preparation Ulcer Cleansing: Rinsed/Irrigated with Saline Topical Anesthetic Applied: None, Other: Lidocaine4%, Treatment Notes Wound #4 (Right, Lateral Hand - Dorsum) 1. Cleansed with: Clean wound with Normal Saline 4. Dressing Applied: Aquacel Ag Notes Conform secured with tape applied to hands. Electronic Signature(s) Signed: 11/13/2016 5:14:29 PM By: Elliot Gurney, BSN, RN, CWS, Kim RN, BSN Entered By: Elliot Gurney, BSN, RN, CWS, Kim on 11/13/2016 10:41:40 [name] ([id number]) -------------------------------------------------------------------------------- Wound Assessment Details Patient Name: PRINCELLA, JASKIEWICZ. Date of Service: 11/13/2016 10:15 AM Medical Record Number: [id number] Patient Account Number: [id number] Date of Birth/Sex: 11/24/1965 (51 y.o. Female) Treating RN: Huel Coventry Primary Care Caela Huot: Einar Crow Other Clinician: Referring Petra Sargeant: Einar Crow Treating Graycen Sadlon/Extender: Altamese [place] in Treatment: 8 Wound Status Wound Number: 5 Primary Etiology: Trauma, Other Wound Location: Right Wrist - Medial Wound Status: Open Wounding Event: Trauma Comorbid History: Sleep Apnea, Quadriplegia Date Acquired: 11/04/2016 Weeks Of Treatment: 1 Clustered Wound:  No Photos Photo Uploaded By: Elliot Gurney, BSN, RN, CWS, Kim on 11/13/2016 11:45:46 Wound Measurements Length: (cm) 2.7 Width: (cm) 2.2 Depth: (cm) 0.1 Area: (cm) 4.665 Volume: (cm) 0.467 % Reduction in Area: 43.4% % Reduction in Volume: 43.4% Epithelialization: None Tunneling: No Undermining: No Wound Description Full Thickness Without Exposed Support Foul Odo Classification: Structures Slough/F Wound Margin: Flat and Intact Exudate Large Amount: Exudate Type: Serous Exudate Color: amber r After Cleansing: No ibrino Yes Wound Bed Granulation Amount: Medium (34-66%) Exposed Structure Granulation Quality: Red Fascia Exposed: No Necrotic Amount: Medium (34-66%) Fat Layer (Subcutaneous Tissue) Exposed: Yes Necrotic Quality: Adherent Slough Tendon Exposed: No Muscle Exposed: No Joint Exposed: No Bone Exposed: No Periwound Skin Texture Texture Color Archuletta, [name] ([id number]) No Abnormalities Noted: No No Abnormalities Noted: No Callus: No Atrophie Blanche: No Crepitus: No Cyanosis: No Excoriation: No Ecchymosis: No Induration: No Erythema: Yes Rash: No Erythema Location: Circumferential Scarring: No Hemosiderin Staining: No Mottled: No Moisture Pallor: No No Abnormalities Noted: No Rubor: No Dry / Scaly: No Maceration: Yes Temperature / Pain Temperature: Hot Wound Preparation Ulcer Cleansing: Rinsed/Irrigated with Saline Topical Anesthetic Applied: None, Other: Lidocaine4%, Treatment Notes Wound #5 (Right, Medial Wrist) 1. Cleansed with: Clean wound with Normal Saline 4. Dressing Applied: Aquacel Ag Notes Conform secured with tape applied to hands. Electronic Signature(s) Signed: 11/13/2016 5:14:29 PM By: Elliot Gurney, BSN, RN, CWS, Kim RN, BSN Entered By: Elliot Gurney, BSN, RN, CWS, Kim on 11/13/2016 10:42:58 [name] ([id number]) -------------------------------------------------------------------------------- Wound Assessment Details Patient  Name: MORGANNE, HAILE. Date of Service: 11/13/2016 10:15 AM Medical Record Number: [id number] Patient Account Number: [id number] Date of Birth/Sex: 16-Aug-1965 (51 y.o. Female) Treating RN: Huel Coventry Primary Care Mineola Duan: Einar Crow Other Clinician: Referring Lacretia Tindall: Einar Crow Treating Siler Mavis/Extender: Altamese [place] in Treatment: 8 Wound Status Wound Number: 6 Primary Etiology: Trauma, Other Wound Location: Right Hand - Web Between 4th and 5th Wound Status: Open Digit Comorbid History: Sleep Apnea, Quadriplegia Wounding Event: Trauma Date Acquired: 11/05/2016 Weeks Of Treatment: 1 Clustered Wound: No Photos Photo Uploaded By: Elliot Gurney, BSN, RN, CWS, Kim on 11/13/2016 11:45:47 Wound Measurements Length: (cm) 1.5 Width: (cm) 0.1 Depth: (cm) 0.1 Area: (cm) 0.118 Volume: (cm) 0.012 % Reduction in Area: 39.8% % Reduction in Volume: 40% Epithelialization: Large (67-100%) Tunneling: No Undermining: No Wound Description Classification:  Partial Thickness Foul Wound Margin: Flat and Intact Sloug Exudate Amount: Small Exudate Type: Serous Exudate Color: amber Odor After Cleansing: No h/Fibrino Yes Wound Bed Granulation Amount: None Present (0%) Exposed Structure Necrotic Amount: Large (67-100%) Fascia Exposed: No Necrotic Quality: Eschar, Adherent Slough Fat Layer (Subcutaneous Tissue) Exposed: No Tendon Exposed: No Muscle Exposed: No Joint Exposed: No Bone Exposed: No Periwound Skin Texture Texture Color No Abnormalities Noted: No No Abnormalities Noted: No Moulin, [name] ([id number]) Callus: No Atrophie Blanche: No Crepitus: No Cyanosis: No Excoriation: No Ecchymosis: No Induration: No Erythema: No Rash: No Hemosiderin Staining: No Scarring: No Mottled: No Pallor: No Moisture Rubor: No No Abnormalities Noted: No Dry / Scaly: No Temperature / Pain Maceration: No Temperature: Hot Wound Preparation Ulcer Cleansing:  Rinsed/Irrigated with Saline Topical Anesthetic Applied: None Treatment Notes Wound #6 (Right Hand - Web Between 4th and 5th Digit) 1. Cleansed with: Clean wound with Normal Saline 4. Dressing Applied: Aquacel Ag Notes Conform secured with tape applied to hands. Electronic Signature(s) Signed: 11/13/2016 5:14:29 PM By: Elliot Gurney, BSN, RN, CWS, Kim RN, BSN Entered By: Elliot Gurney, BSN, RN, CWS, Kim on 11/13/2016 10:43:40 [name] ([id number]) -------------------------------------------------------------------------------- Wound Assessment Details Patient Name: AVERY, EUSTICE. Date of Service: 11/13/2016 10:15 AM Medical Record Number: [id number] Patient Account Number: [id number] Date of Birth/Sex: 06-17-1965 (51 y.o. Female) Treating RN: Huel Coventry Primary Care Catriona Dillenbeck: Einar Crow Other Clinician: Referring Bettylou Frew: Einar Crow Treating Jolene Guyett/Extender: Altamese [place] in Treatment: 8 Wound Status Wound Number: 7 Primary Etiology: Trauma, Other Wound Location: Left Hand - Dorsum - Dorsal Wound Status: Open Wounding Event: Trauma Comorbid History: Sleep Apnea, Quadriplegia Date Acquired: 11/05/2016 Weeks Of Treatment: 1 Clustered Wound: No Photos Photo Uploaded By: Elliot Gurney, BSN, RN, CWS, Kim on 11/13/2016 11:46:38 Wound Measurements Length: (cm) 1.2 Width: (cm) 1 Depth: (cm) 0.1 Area: (cm) 0.942 Volume: (cm) 0.094 % Reduction in Area: 87% % Reduction in Volume: 87.1% Epithelialization: None Tunneling: No Undermining: No Wound Description Full Thickness Without Exposed Support Foul Odo Classification: Structures Slough/F Wound Margin: Flat and Intact Exudate Large Amount: Exudate Type: Serous Exudate Color: amber r After Cleansing: No ibrino Yes Wound Bed Granulation Amount: None Present (0%) Exposed Structure Necrotic Amount: Large (67-100%) Fascia Exposed: No Necrotic Quality: Eschar, Adherent Slough Fat Layer (Subcutaneous  Tissue) Exposed: No Tendon Exposed: No Muscle Exposed: No Joint Exposed: No Bone Exposed: No Periwound Skin Texture Texture Color Mengel, Namya K. ([id number]) No Abnormalities Noted: No No Abnormalities Noted: No Callus: No Atrophie Blanche: No Crepitus: No Cyanosis: No Excoriation: No Ecchymosis: No Induration: No Erythema: Yes Rash: No Erythema Location: Circumferential Scarring: No Hemosiderin Staining: No Mottled: No Moisture Pallor: No No Abnormalities Noted: No Rubor: No Dry / Scaly: No Maceration: No Temperature / Pain Temperature: Hot Wound Preparation Ulcer Cleansing: Rinsed/Irrigated with Saline Topical Anesthetic Applied: None, Other: Lidocaine4%, Treatment Notes Wound #7 (Left, Dorsal Hand - Dorsum) 1. Cleansed with: Clean wound with Normal Saline 4. Dressing Applied: Aquacel Ag Notes Conform secured with tape applied to hands. Electronic Signature(s) Signed: 11/13/2016 5:14:29 PM By: Elliot Gurney, BSN, RN, CWS, Kim RN, BSN Entered By: Elliot Gurney, BSN, RN, CWS, Kim on 11/13/2016 10:44:24 [name] ([id number]) -------------------------------------------------------------------------------- Wound Assessment Details Patient Name: MAGIE, CIAMPA. Date of Service: 11/13/2016 10:15 AM Medical Record Number: [id number] Patient Account Number: [id number] Date of Birth/Sex: 09-24-1965 (51 y.o. Female) Treating RN: Huel Coventry Primary Care Godfrey Tritschler: Einar Crow Other Clinician: Referring Alyana Kreiter: Einar Crow Treating Detrick Dani/Extender: Altamese [place] in Treatment: 8  Wound Status Wound Number: 8 Primary Etiology: Trauma, Other Wound Location: Left Hand - Web Between 1st and 2nd Wound Status: Open Digit Comorbid History: Sleep Apnea, Quadriplegia Wounding Event: Trauma Date Acquired: 11/05/2016 Weeks Of Treatment: 1 Clustered Wound: No Photos Photo Uploaded By: Elliot GurneyWoody, BSN, RN, CWS, Kim on 11/13/2016 11:46:38 Wound  Measurements Length: (cm) 1 Width: (cm) 0.7 Depth: (cm) 0.1 Area: (cm) 0.55 Volume: (cm) 0.055 % Reduction in Area: 88.3% % Reduction in Volume: 88.3% Epithelialization: None Tunneling: No Undermining: No Wound Description Full Thickness With Exposed Support Foul Odo Classification: Structures Slough/F Wound Margin: Flat and Intact Exudate Large Amount: Exudate Type: Serous Exudate Color: amber r After Cleansing: No ibrino Yes Wound Bed Granulation Amount: None Present (0%) Exposed Structure Necrotic Amount: Large (67-100%) Fascia Exposed: No Necrotic Quality: Eschar, Adherent Slough Fat Layer (Subcutaneous Tissue) Exposed: Yes Tendon Exposed: No Muscle Exposed: No Joint Exposed: No Bone Exposed: No Periwound Skin Texture Mixson, Milagro K. ([id number]) Texture Color No Abnormalities Noted: No No Abnormalities Noted: No Callus: No Atrophie Blanche: No Crepitus: No Cyanosis: No Excoriation: No Ecchymosis: No Induration: No Erythema: No Rash: No Hemosiderin Staining: No Scarring: No Mottled: No Pallor: No Moisture Rubor: No No Abnormalities Noted: No Dry / Scaly: No Temperature / Pain Maceration: No Temperature: Hot Wound Preparation Ulcer Cleansing: Rinsed/Irrigated with Saline Topical Anesthetic Applied: None, Other: Lidocaine4%, Treatment Notes Wound #8 (Left Hand - Web Between 1st and 2nd Digit) 1. Cleansed with: Clean wound with Normal Saline 4. Dressing Applied: Aquacel Ag Notes Conform secured with tape applied to hands. Electronic Signature(s) Signed: 11/13/2016 5:14:29 PM By: Elliot GurneyWoody, BSN, RN, CWS, Kim RN, BSN Entered By: Elliot GurneyWoody, BSN, RN, CWS, Kim on 11/13/2016 10:45:02 Carolyn OrleansBYRUM, Jaydy K. ([id number]) -------------------------------------------------------------------------------- Vitals Details Patient Name: Carolyn OrleansBYRUM, Loralei K. Date of Service: 11/13/2016 10:15 AM Medical Record Number: [id number] Patient Account Number:  [id number] Date of Birth/Sex: 19-Oct-1965 (51 y.o. Female) Treating RN: Huel CoventryWoody, Kim Primary Care Lakendrick Paradis: Einar CrowAnderson, Marshall Other Clinician: Referring Tanecia Mccay: Einar CrowAnderson, Marshall Treating Mirian Casco/Extender: Altamese CarolinaOBSON, MICHAEL G Weeks in Treatment: 8 Vital Signs Time Taken: 10:30 Pulse (bpm): 114 Respiratory Rate (breaths/min): 20 Blood Pressure (mmHg): 129/104 Reference Range: 80 - 120 mg / dl Electronic Signature(s) Signed: 11/13/2016 5:14:29 PM By: Elliot GurneyWoody, BSN, RN, CWS, Kim RN, BSN Entered By: Elliot GurneyWoody, BSN, RN, CWS, Kim on 11/13/2016 10:33:20

## 2016-11-15 NOTE — Progress Notes (Signed)
[name], [name] ([id number]) Visit Report for 11/13/2016 HPI Details Patient Name: [name], [name]. Date of Service: 11/13/2016 10:15 AM Medical Record Number: [id number] Patient Account Number: [id number] Date of Birth/Sex: 15-May-1965 (51 y.o. [name]) Treating RN: Huel Coventry Primary Care Provider: Einar Crow Other Clinician: Referring Provider: Einar Crow Treating Provider/Extender: Altamese [place] in Treatment: 8 History of Present Illness HPI Description: 09/18/16; this is a 51 year old woman with severe mental retardation, cerebral palsy. She apparently has a long history of repetitive movements of her hands back and forth to her mouth predominantly involving the lateral aspect of the hands especially the inner phalangeal area between the thumb and first fingers bilaterally. She was recently put on Augmentin by primary care for concern about cellulitis. She lives at [facility]. She has a history of cerebral palsy, mental retardation, functional quadriplegia although she has reasonable use of the left greater than right arm. She also has seizure disorder 10/02/16; the facility has obtained her gloves for her hands this is really helped. There is nothing open on the right hand however in the webspace between her thumb and first finger purulent drainage noted by her intake nurse. Small probing wound. 10/09/16; culture I did last week showed methicillin sensitive staph aureus that should've been well covered by the Augmentin. I gave her. In the meantime the facility where she is seems to be concerned about the glove. They gave her last time is being some form of restraint which is an issue, I'm well familiar with. We have been using silver alginate to the wound in the webspace between her thumb and first finger. Still looking at the left hand in the webspace between the him and first finger. 10/15/16; wounds in the left first and webspace caused by  recurrent bite/leaking injury has resolved. There is no evidence of infection. The facility is obtained really nice gloves to protect her hands. These can be removed for bathing and at mealtimes with the patient is apparently able to participate in feeding. 10/30/16; apparently the last time she was here there was a wound on her right lateral hand however we did not look at this underneath her glove. She arrived today in clinic with wraps very tightly around both hands. 11/05/16; patient arrived today with both hands in a very poor state. Firstly on the right she had swelling redness and pain on the top of her hand and especially involving the right. This is indicative of cellulitis. Necrotic wound in the webspace between the thumb and the first finger, fourth and fifth finger and a large superficial wound over the dorsal right hand. oOn the left that she had 2 wounds one on the dorsal hand and one in the webspace first and second digits. oMost concerning thing is the degree of swelling erythema and tenderness on the dorsal right hand 11/13/16; some improvement in the cellulitis on the dorsal right hand otherwise most of her wounds look roughly the same. Culture I took from the right first second web space grew MSSA which should've been sensitive to the Augmentin I gave her empirically last week. The fact that the edema and erythema on the top of her hand is better suggest the Augmentin was effective. We have been using silver alginate to all of her wounds Electronic Signature(s) Signed: 11/13/2016 5:28:55 PM By: Baltazar Najjar MD Entered By: Baltazar Najjar on 11/13/2016 11:28:23 [name] ([id number]) -------------------------------------------------------------------------------- Physical Exam Details Patient Name: [name], [name]. Date of Service: 11/13/2016 10:15 AM  Medical Record Number: [id number] Patient Account Number: [id number] Date of Birth/Sex: Mar 22, 1965 (51 y.o.  [name]) Treating RN: Huel Coventry Primary Care Provider: Einar Crow Other Clinician: Referring Provider: Einar Crow Treating Provider/Extender: Altamese [place] in Treatment: 8 Constitutional Sitting or standing Blood Pressure is within target range for patient.. Pulse regular and within target range for patient.Marland Kitchen Respirations regular, non-labored and within target range.. Temperature is normal and within the target range for the patient.Marland Kitchen appears in no distress. Patient does not appear septic. Eyes Conjunctivae clear. No discharge. Respiratory Respiratory effort is easy and symmetric bilaterally. Rate is normal at rest and on room air.Marland Kitchen Lymphatic None palpable in the epitrochlear or axillary areas.. Musculoskeletal There is no evidence of septic arthritis in either hand. Psychiatric Very restless today. Notes wound exam; oLeft hand there are wound areas on t left dorsal hand, most problematic Lee in th thumb and first finger web spac erythema and swelling is improve on the dorsal hand d oright hand. necrotic wound right 4th hand and first and second web space Much too agintate for debridement Electronic Signature(s) Signed: 11/13/2016 5:28:55 PM By: Baltazar Najjar MD Entered By: Baltazar Najjar on 11/13/2016 12:11:24 [name] ([id number]) -------------------------------------------------------------------------------- Physician Orders Details Patient Name: [name], [name]. Date of Service: 11/13/2016 10:15 AM Medical Record Number: [id number] Patient Account Number: [id number] Date of Birth/Sex: 1965-04-03 (51 y.o. [name]) Treating RN: Huel Coventry Primary Care Provider: Einar Crow Other Clinician: Referring Provider: Einar Crow Treating Provider/Extender: Altamese [place] in Treatment: 8 Verbal / Phone Orders: No Diagnosis Coding Wound Cleansing Wound #1R Right Hand - Web Between 1st and 2nd Digit o Clean wound with  Normal Saline. o Cleanse wound with mild soap and water Anesthetic Wound #1R Right Hand - Web Between 1st and 2nd Digit o Topical Lidocaine 4% cream applied to wound bed prior to debridement Wound #4 Right,Lateral Hand - Dorsum o Topical Lidocaine 4% cream applied to wound bed prior to debridement Wound #5 Right,Medial Wrist o Topical Lidocaine 4% cream applied to wound bed prior to debridement Wound #6 Right Hand - Web Between 4th and 5th Digit o Topical Lidocaine 4% cream applied to wound bed prior to debridement Wound #7 Left,Dorsal Hand - Dorsum o Topical Lidocaine 4% cream applied to wound bed prior to debridement Wound #8 Left Hand - Web Between 1st and 2nd Digit o Topical Lidocaine 4% cream applied to wound bed prior to debridement Primary Wound Dressing Wound #1R Right Hand - Web Between 1st and 2nd Digit o Other: - Silvercell Ag or equal Wound #4 Right,Lateral Hand - Dorsum o Other: - Silvercell Ag or equal Wound #5 Right,Medial Wrist o Other: - Silvercell Ag or equal Wound #6 Right Hand - Web Between 4th and 5th Digit o Other: - Silvercell Ag or equal Wound #7 Left,Dorsal Hand - Dorsum o Other: - Silvercell Ag or equal Wound #8 Left Hand - Web Between 1st and 2nd Digit o Other: - Silvercell Ag or equal Secondary Dressing ROSMERY, DUGGIN. ([id number]) Wound #1R Right Hand - Web Between 1st and 2nd Digit o ABD pad o Conform/Kerlix - DO NOT USE COBAN! Wound #4 Right,Lateral Hand - Dorsum o ABD pad o Conform/Kerlix - DO NOT USE COBAN! Wound #5 Right,Medial Wrist o ABD pad o Conform/Kerlix - DO NOT USE COBAN! Wound #6 Right Hand - Web Between 4th and 5th Digit o ABD pad o Conform/Kerlix - DO NOT USE COBAN! Wound #7 Left,Dorsal Hand - Dorsum o ABD pad o  Conform/Kerlix - DO NOT USE COBAN! Wound #8 Left Hand - Web Between 1st and 2nd Digit o ABD pad o Conform/Kerlix - DO NOT USE COBAN! Dressing Change  Frequency Wound #1R Right Hand - Web Between 1st and 2nd Digit o Change dressing every other day. Wound #4 Right,Lateral Hand - Dorsum o Change dressing every other day. Wound #5 Right,Medial Wrist o Change dressing every other day. Wound #6 Right Hand - Web Between 4th and 5th Digit o Change dressing every other day. Wound #7 Left,Dorsal Hand - Dorsum o Change dressing every other day. Wound #8 Left Hand - Web Between 1st and 2nd Digit o Change dressing every other day. Follow-up Appointments Wound #1R Right Hand - Web Between 1st and 2nd Digit o Return Appointment in 1 week. Wound #4 Right,Lateral Hand - Dorsum o Return Appointment in 1 week. Wound #5 Right,Medial Wrist o Return Appointment in 1 week. Wound #6 Right Hand - Web Between 4th and 5th Digit o Return Appointment in 1 week. Wound #7 Left,Dorsal Hand - Dorsum [name], [name] ([id number]) o Return Appointment in 1 week. Wound #8 Left Hand - Web Between 1st and 2nd Digit o Return Appointment in 1 week. Electronic Signature(s) Signed: 11/13/2016 5:14:29 PM By: Elliot Gurney, BSN, RN, CWS, Kim RN, BSN Signed: 11/13/2016 5:28:55 PM By: Baltazar Najjar MD Entered By: Elliot Gurney, BSN, RN, CWS, Kim on 11/13/2016 10:59:50 [name] ([id number]) -------------------------------------------------------------------------------- Problem List Details Patient Name: JANAZIA, SCHREIER. Date of Service: 11/13/2016 10:15 AM Medical Record Number: [id number] Patient Account Number: [id number] Date of Birth/Sex: 1965-06-25 (51 y.o. [name]) Treating RN: Huel Coventry Primary Care Provider: Einar Crow Other Clinician: Referring Provider: Einar Crow Treating Provider/Extender: Altamese [place] in Treatment: 8 Active Problems ICD-10 Encounter Code Description Active Date Diagnosis S60.571D Other superficial bite of hand of right hand, subsequent encounter 09/18/2016 Yes S60.571D Other  superficial bite of hand of right hand, subsequent encounter 10/30/2016 Yes L03.113 Cellulitis of right upper limb 11/05/2016 Yes Inactive Problems ICD-10 Code Description Active Date Inactive Date S60.572D Other superficial bite of hand of left hand, subsequent encounter 09/18/2016 09/18/2016 L03.113 Cellulitis of right upper limb 09/18/2016 09/18/2016 L03.114 Cellulitis of left upper limb 09/18/2016 09/18/2016 Resolved Problems Electronic Signature(s) Signed: 11/13/2016 5:28:55 PM By: Baltazar Najjar MD Entered By: Baltazar Najjar on 11/13/2016 11:27:06 [name] ([id number]) -------------------------------------------------------------------------------- Progress Note Details Patient Name: [name]. Date of Service: 11/13/2016 10:15 AM Medical Record Number: [id number] Patient Account Number: [id number] Date of Birth/Sex: 23-Oct-1965 (51 y.o. [name]) Treating RN: Huel Coventry Primary Care Provider: Einar Crow Other Clinician: Referring Provider: Einar Crow Treating Provider/Extender: Altamese  in Treatment: 8 Subjective History of Present Illness (HPI) 09/18/16; this is a 51 year old woman with severe mental retardation, cerebral palsy. She apparently has a long history of repetitive movements of her hands back and forth to her mouth predominantly involving the lateral aspect of the hands especially the inner phalangeal area between the thumb and first fingers bilaterally. She was recently put on Augmentin by primary care for concern about cellulitis. She lives at [facility]. She has a history of cerebral palsy, mental retardation, functional quadriplegia although she has reasonable use of the left greater than right arm. She also has seizure disorder 10/02/16; the facility has obtained her gloves for her hands this is really helped. There is nothing open on the right hand however in the webspace between her thumb and first finger  purulent drainage noted by her intake nurse.  Small probing wound. 10/09/16; culture I did last week showed methicillin sensitive staph aureus that should've been well covered by the Augmentin. I gave her. In the meantime the facility where she is seems to be concerned about the glove. They gave her last time is being some form of restraint which is an issue, I'm well familiar with. We have been using silver alginate to the wound in the webspace between her thumb and first finger. Still looking at the left hand in the webspace between the him and first finger. 10/15/16; wounds in the left first and webspace caused by recurrent bite/leaking injury has resolved. There is no evidence of infection. The facility is obtained really nice gloves to protect her hands. These can be removed for bathing and at mealtimes with the patient is apparently able to participate in feeding. 10/30/16; apparently the last time she was here there was a wound on her right lateral hand however we did not look at this underneath her glove. She arrived today in clinic with wraps very tightly around both hands. 11/05/16; patient arrived today with both hands in a very poor state. Firstly on the right she had swelling redness and pain on the top of her hand and especially involving the right. This is indicative of cellulitis. Necrotic wound in the webspace between the thumb and the first finger, fourth and fifth finger and a large superficial wound over the dorsal right hand. On the left that she had 2 wounds one on the dorsal hand and one in the webspace first and second digits. Most concerning thing is the degree of swelling erythema and tenderness on the dorsal right hand 11/13/16; some improvement in the cellulitis on the dorsal right hand otherwise most of her wounds look roughly the same. Culture I took from the right first second web space grew MSSA which should've been sensitive to the Augmentin I gave her empirically  last week. The fact that the edema and erythema on the top of her hand is better suggest the Augmentin was effective. We have been using silver alginate to all of her wounds Objective Constitutional Sitting or standing Blood Pressure is within target range for patient.. Pulse regular and within target range for patient.Marland Kitchen Respirations regular, non-labored and within target range.. Temperature is normal and within the target range for the patient.Marland Kitchen appears in no distress. Patient does not appear septic. Vitals Time Taken: 10:30 AM, Pulse: 114 bpm, Respiratory Rate: 20 breaths/min, Blood Pressure: 129/104 mmHg. [name], [name] ([id number]) Eyes Conjunctivae clear. No discharge. Respiratory Respiratory effort is easy and symmetric bilaterally. Rate is normal at rest and on room air.Marland Kitchen Lymphatic None palpable in the epitrochlear or axillary areas.. Musculoskeletal There is no evidence of septic arthritis in either hand. Psychiatric Very restless today. General Notes: wound exam; Left hand there are wound areas on t left dorsal hand, most problematic Lee in th thumb and first finger web spac erythema and swelling is improve on the dorsal hand d right hand. necrotic wound right 4th hand and first and second web space Much too agintate for debridement Integumentary (Hair, Skin) Wound #1R status is Open. Original cause of wound was Trauma. The wound is located on the Right Hand - Web Between 1st and 2nd Digit. The wound measures 1.5cm length x 1.5cm width x 0.3cm depth; 1.767cm^2 area and 0.53cm^3 volume. There is Fat Layer (Subcutaneous Tissue) Exposed exposed. There is no tunneling or undermining noted. There is a medium amount of serous drainage noted. The wound  margin is flat and intact. There is no granulation within the wound bed. There is a large (67-100%) amount of necrotic tissue within the wound bed including Adherent Slough. The periwound skin appearance did not exhibit: Callus,  Crepitus, Excoriation, Induration, Rash, Scarring, Dry/Scaly, Maceration, Atrophie Blanche, Cyanosis, Ecchymosis, Hemosiderin Staining, Mottled, Pallor, Rubor, Erythema. Periwound temperature was noted as Hot. Wound #4 status is Open. Original cause of wound was Bite. The wound is located on the Right,Lateral Hand - Dorsum. The wound measures 0.8cm length x 1cm width x 0.1cm depth; 0.628cm^2 area and 0.063cm^3 volume. There is no tunneling or undermining noted. There is a medium amount of serosanguineous drainage noted. The wound margin is flat and intact. There is no granulation within the wound bed. There is a large (67-100%) amount of necrotic tissue within the wound bed including Eschar and Adherent Slough. The periwound skin appearance exhibited: Dry/Scaly, Erythema. The surrounding wound skin color is noted with erythema. Periwound temperature was noted as Hot. Wound #5 status is Open. Original cause of wound was Trauma. The wound is located on the Right,Medial Wrist. The wound measures 2.7cm length x 2.2cm width x 0.1cm depth; 4.665cm^2 area and 0.467cm^3 volume. There is Fat Layer (Subcutaneous Tissue) Exposed exposed. There is no tunneling or undermining noted. There is a large amount of serous drainage noted. The wound margin is flat and intact. There is medium (34-66%) red granulation within the wound bed. There is a medium (34-66%) amount of necrotic tissue within the wound bed including Adherent Slough. The periwound skin appearance exhibited: Maceration, Erythema. The periwound skin appearance did not exhibit: Callus, Crepitus, Excoriation, Induration, Rash, Scarring, Dry/Scaly, Atrophie Blanche, Cyanosis, Ecchymosis, Hemosiderin Staining, Mottled, Pallor, Rubor. The surrounding wound skin color is noted with erythema which is circumferential. Periwound temperature was noted as Hot. Wound #6 status is Open. Original cause of wound was Trauma. The wound is located on the Right Hand -  Web Between 4th and 5th Digit. The wound measures 1.5cm length x 0.1cm width x 0.1cm depth; 0.118cm^2 area and 0.012cm^3 volume. There is no tunneling or undermining noted. There is a small amount of serous drainage noted. The wound margin is flat and intact. There is no granulation within the wound bed. There is a large (67-100%) amount of necrotic tissue within the wound bed including Eschar and Adherent Slough. The periwound skin appearance did not exhibit: Callus, Crepitus, Excoriation, Induration, Rash, Scarring, Dry/Scaly, Maceration, Atrophie Blanche, Cyanosis, Ecchymosis, Hemosiderin Staining, Mottled, Pallor, Rubor, Erythema. Periwound temperature was noted as Hot. Wound #7 status is Open. Original cause of wound was Trauma. The wound is located on the Left,Dorsal Hand - Dorsum. The wound measures 1.2cm length x 1cm width x 0.1cm depth; 0.942cm^2 area and 0.094cm^3 volume. There is no tunneling or undermining noted. There is a large amount of serous drainage noted. The wound margin is flat and intact. There [name], [name] ([id number]) is no granulation within the wound bed. There is a large (67-100%) amount of necrotic tissue within the wound bed including Eschar and Adherent Slough. The periwound skin appearance exhibited: Erythema. The periwound skin appearance did not exhibit: Callus, Crepitus, Excoriation, Induration, Rash, Scarring, Dry/Scaly, Maceration, Atrophie Blanche, Cyanosis, Ecchymosis, Hemosiderin Staining, Mottled, Pallor, Rubor. The surrounding wound skin color is noted with erythema which is circumferential. Periwound temperature was noted as Hot. Wound #8 status is Open. Original cause of wound was Trauma. The wound is located on the Left Hand - Web Between 1st and 2nd Digit. The wound measures 1cm  length x 0.7cm width x 0.1cm depth; 0.55cm^2 area and 0.055cm^3 volume. There is Fat Layer (Subcutaneous Tissue) Exposed exposed. There is no tunneling or undermining noted.  There is a large amount of serous drainage noted. The wound margin is flat and intact. There is no granulation within the wound bed. There is a large (67-100%) amount of necrotic tissue within the wound bed including Eschar and Adherent Slough. The periwound skin appearance did not exhibit: Callus, Crepitus, Excoriation, Induration, Rash, Scarring, Dry/Scaly, Maceration, Atrophie Blanche, Cyanosis, Ecchymosis, Hemosiderin Staining, Mottled, Pallor, Rubor, Erythema. Periwound temperature was noted as Hot. Assessment Active Problems ICD-10 S60.571D - Other superficial bite of hand of right hand, subsequent encounter S60.571D - Other superficial bite of hand of right hand, subsequent encounter L03.113 - Cellulitis of right upper limb Plan Wound Cleansing: Wound #1R Right Hand - Web Between 1st and 2nd Digit: Clean wound with Normal Saline. Cleanse wound with mild soap and water Anesthetic: Wound #1R Right Hand - Web Between 1st and 2nd Digit: Topical Lidocaine 4% cream applied to wound bed prior to debridement Wound #4 Right,Lateral Hand - Dorsum: Topical Lidocaine 4% cream applied to wound bed prior to debridement Wound #5 Right,Medial Wrist: Topical Lidocaine 4% cream applied to wound bed prior to debridement Wound #6 Right Hand - Web Between 4th and 5th Digit: Topical Lidocaine 4% cream applied to wound bed prior to debridement Wound #7 Left,Dorsal Hand - Dorsum: Topical Lidocaine 4% cream applied to wound bed prior to debridement Wound #8 Left Hand - Web Between 1st and 2nd Digit: Topical Lidocaine 4% cream applied to wound bed prior to debridement Primary Wound Dressing: Wound #1R Right Hand - Web Between 1st and 2nd Digit: Other: - Silvercell Ag or equal Wound #4 Right,Lateral Hand - Dorsum: Other: - Silvercell Ag or equal Wound #5 Right,Medial Wrist: Other: - Silvercell Ag or equal [name], [name] ([id number]) Wound #6 Right Hand - Web Between 4th and 5th Digit: Other: -  Silvercell Ag or equal Wound #7 Left,Dorsal Hand - Dorsum: Other: - Silvercell Ag or equal Wound #8 Left Hand - Web Between 1st and 2nd Digit: Other: - Silvercell Ag or equal Secondary Dressing: Wound #1R Right Hand - Web Between 1st and 2nd Digit: ABD pad Conform/Kerlix - DO NOT USE COBAN! Wound #4 Right,Lateral Hand - Dorsum: ABD pad Conform/Kerlix - DO NOT USE COBAN! Wound #5 Right,Medial Wrist: ABD pad Conform/Kerlix - DO NOT USE COBAN! Wound #6 Right Hand - Web Between 4th and 5th Digit: ABD pad Conform/Kerlix - DO NOT USE COBAN! Wound #7 Left,Dorsal Hand - Dorsum: ABD pad Conform/Kerlix - DO NOT USE COBAN! Wound #8 Left Hand - Web Between 1st and 2nd Digit: ABD pad Conform/Kerlix - DO NOT USE COBAN! Dressing Change Frequency: Wound #1R Right Hand - Web Between 1st and 2nd Digit: Change dressing every other day. Wound #4 Right,Lateral Hand - Dorsum: Change dressing every other day. Wound #5 Right,Medial Wrist: Change dressing every other day. Wound #6 Right Hand - Web Between 4th and 5th Digit: Change dressing every other day. Wound #7 Left,Dorsal Hand - Dorsum: Change dressing every other day. Wound #8 Left Hand - Web Between 1st and 2nd Digit: Change dressing every other day. Follow-up Appointments: Wound #1R Right Hand - Web Between 1st and 2nd Digit: Return Appointment in 1 week. Wound #4 Right,Lateral Hand - Dorsum: Return Appointment in 1 week. Wound #5 Right,Medial Wrist: Return Appointment in 1 week. Wound #6 Right Hand - Web Between 4th and 5th  Digit: Return Appointment in 1 week. Wound #7 Left,Dorsal Hand - Dorsum: Return Appointment in 1 week. Wound #8 Left Hand - Web Between 1st and 2nd Digit: Return Appointment in 1 week. [name], [name] ([id number]) #1 the patient's cellulitis in the dorsal right hand is better. Culture of the first and second digit webspace on the right grew methicillin sensitive staph aureus. She needs another week of  Augmentin at the same dose #2 the patient is also going to need the bride meant of the 2 areas on the left hand and the thumb first finger web space wound although she was far too agitated today to do this #3 intake nurse stated that the wrap is still too tight #4 the acute phase of the cellulitis in her hand is better however I am still very concerned especially about the wound and the webspace of the right thumb and first finger. This is necrotic however I was not really able to de bride this or even properly examine this today #5 into new silver alginate to all the wounds. Careful attention to keeping her hands out of her mouth Electronic Signature(s) Signed: 11/13/2016 5:28:55 PM By: Baltazar Najjar MD Entered By: Baltazar Najjar on 11/13/2016 12:13:56 [name] ([id number]) -------------------------------------------------------------------------------- SuperBill Details Patient Name: [name]. Date of Service: 11/13/2016 Medical Record Number: [id number] Patient Account Number: [id number] Date of Birth/Sex: 12-Jan-1966 (51 y.o. [name]) Treating RN: Huel Coventry Primary Care Provider: Einar Crow Other Clinician: Referring Provider: Einar Crow Treating Provider/Extender: [place] in Treatment: 8 Diagnosis Coding ICD-10 Codes Code Description S60.571D Other superficial bite of hand of right hand, subsequent encounter S60.571D Other superficial bite of hand of right hand, subsequent encounter L03.113 Cellulitis of right upper limb Facility Procedures CPT4 Code: [id number] Description: [phone] - WOUND CARE VISIT-LEV 5 EST PT Modifier: Quantity: 1 Physician Procedures CPT4 Code: [id number] Description: 99213 - WC PHYS LEVEL 3 - EST PT ICD-10 Diagnosis Description L03.113 Cellulitis of right upper limb S60.571D Other superficial bite of hand of right hand, subsequent e Modifier: ncounter Quantity: 1 Electronic Signature(s) Signed: 11/13/2016  5:03:16 PM By: Elliot Gurney, BSN, RN, CWS, Kim RN, BSN Signed: 11/13/2016 5:28:55 PM By: Baltazar Najjar MD Entered By: Elliot Gurney, BSN, RN, CWS, Kim on 11/13/2016 17:03:16

## 2016-11-20 ENCOUNTER — Encounter: Payer: Medicare Other | Admitting: Physician Assistant

## 2016-11-20 DIAGNOSIS — S60571D Other superficial bite of hand of right hand, subsequent encounter: Secondary | ICD-10-CM | POA: Diagnosis not present

## 2016-11-22 NOTE — Progress Notes (Signed)
[name], Lendy K. ([id number]) Visit Report for 11/20/2016 Arrival Information Details Patient Name: [name], [name] Date of Service: 11/20/2016 11:00 AM Medical Record Number: [id number] Patient Account Number: [id number] Date of Birth/Sex: 1966-01-19 (51 y.o. Female) Treating RN: Curtis Sitesorthy, Joanna Primary Care Prajna Vanderpool: Einar CrowAnderson, Marshall Other Clinician: Referring Yevonne Yokum: Einar CrowAnderson, Marshall Treating Jamespaul Secrist/Extender: Linwood DibblesSTONE III, HOYT Weeks in Treatment: 9 Visit Information History Since Last Visit All ordered tests and consults were No Patient Arrived: Wheel completed: Chair Added or deleted any medications: No Arrival Time: 11:04 Any new allergies or adverse reactions: No Accompanied By: caregivers Had a fall or experienced change in No Transfer Assistance: None activities of daily living that may affect Patient Identification Verified: Yes risk of falls: Secondary Verification Process Completed: Yes Signs or symptoms of abuse/neglect since No Patient Requires Transmission-Based No last visito Precautions: Hospitalized since last visit: No Patient Has Alerts: Yes Has Dressing in Place as Prescribed: Yes Pain Present Now: Unable to Respond Electronic Signature(s) Signed: 11/20/2016 4:29:50 PM By: Curtis Sitesorthy, Joanna Entered By: Curtis Sitesorthy, Joanna on 11/20/2016 11:05:29 [name], Aluna K. ([id number]) -------------------------------------------------------------------------------- Clinic Level of Care Assessment Details Patient Name: [name], [name] Date of Service: 11/20/2016 11:00 AM Medical Record Number: [id number] Patient Account Number: [id number] Date of Birth/Sex: 1966-01-19 (51 y.o. Female) Treating RN: Curtis Sitesorthy, Joanna Primary Care Milo Schreier: Einar CrowAnderson, Marshall Other Clinician: Referring Averlee Swartz: Einar CrowAnderson, Marshall Treating Dustin Bumbaugh/Extender: Linwood DibblesSTONE III, HOYT Weeks in Treatment: 9 Clinic Level of Care Assessment Items TOOL 4 Quantity Score []  - Use when only an  EandM is performed on FOLLOW-UP visit 0 ASSESSMENTS - Nursing Assessment / Reassessment X - Reassessment of Co-morbidities (includes updates in patient status) 1 10 X- 1 5 Reassessment of Adherence to Treatment Plan ASSESSMENTS - Wound and Skin Assessment / Reassessment []  - Simple Wound Assessment / Reassessment - one wound 0 X- 5 5 Complex Wound Assessment / Reassessment - multiple wounds []  - 0 Dermatologic / Skin Assessment (not related to wound area) ASSESSMENTS - Focused Assessment []  - Circumferential Edema Measurements - multi extremities 0 []  - 0 Nutritional Assessment / Counseling / Intervention X- 1 5 Lower Extremity Assessment (monofilament, tuning fork, pulses) []  - 0 Peripheral Arterial Disease Assessment (using hand held doppler) ASSESSMENTS - Ostomy and/or Continence Assessment and Care []  - Incontinence Assessment and Management 0 []  - 0 Ostomy Care Assessment and Management (repouching, etc.) PROCESS - Coordination of Care X - Simple Patient / Family Education for ongoing care 1 15 []  - 0 Complex (extensive) Patient / Family Education for ongoing care []  - 0 Staff obtains ChiropractorConsents, Records, Test Results / Process Orders []  - 0 Staff telephones HHA, Nursing Homes / Clarify orders / etc []  - 0 Routine Transfer to another Facility (non-emergent condition) []  - 0 Routine Hospital Admission (non-emergent condition) []  - 0 New Admissions / Manufacturing engineernsurance Authorizations / Ordering NPWT, Apligraf, etc. []  - 0 Emergency Hospital Admission (emergent condition) X- 1 10 Simple Discharge Coordination [name], [name] ([id number]) []  - 0 Complex (extensive) Discharge Coordination PROCESS - Special Needs []  - Pediatric / Minor Patient Management 0 []  - 0 Isolation Patient Management []  - 0 Hearing / Language / Visual special needs []  - 0 Assessment of Community assistance (transportation, D/C planning, etc.) []  - 0 Additional assistance / Altered mentation []  -  0 Support Surface(s) Assessment (bed, cushion, seat, etc.) INTERVENTIONS - Wound Cleansing / Measurement []  - Simple Wound Cleansing - one wound 0 X- 5 5 Complex Wound Cleansing - multiple wounds X- 1 5 Wound Imaging (photographs -  any number of wounds) []  - 0 Wound Tracing (instead of photographs) []  - 0 Simple Wound Measurement - one wound X- 5 5 Complex Wound Measurement - multiple wounds INTERVENTIONS - Wound Dressings X - Small Wound Dressing one or multiple wounds 5 10 []  - 0 Medium Wound Dressing one or multiple wounds []  - 0 Large Wound Dressing one or multiple wounds []  - 0 Application of Medications - topical []  - 0 Application of Medications - injection INTERVENTIONS - Miscellaneous []  - External ear exam 0 []  - 0 Specimen Collection (cultures, biopsies, blood, body fluids, etc.) []  - 0 Specimen(s) / Culture(s) sent or taken to Lab for analysis []  - 0 Patient Transfer (multiple staff / Nurse, adult / Similar devices) []  - 0 Simple Staple / Suture removal (25 or less) []  - 0 Complex Staple / Suture removal (26 or more) []  - 0 Hypo / Hyperglycemic Management (close monitor of Blood Glucose) []  - 0 Ankle / Brachial Index (ABI) - do not check if billed separately X- 1 5 Vital Signs Woollard, Rayni K. ([id number]) Has the patient been seen at the hospital within the last three years: Yes Total Score: 180 Level Of Care: New/Established - Level 5 Electronic Signature(s) Signed: 11/20/2016 4:29:50 PM By: Curtis Sites Entered By: Curtis Sites on 11/20/2016 15:44:13 Soucy, [name] ([id number]) -------------------------------------------------------------------------------- Encounter Discharge Information Details Patient Name: [name]. Date of Service: 11/20/2016 11:00 AM Medical Record Number: [id number] Patient Account Number: [id number] Date of Birth/Sex: 10/18/1965 (51 y.o. Female) Treating RN: Curtis Sites Primary Care Zuleyma Scharf: Einar Crow Other Clinician: Referring Shahd Occhipinti: Einar Crow Treating Lilianah Buffin/Extender: Linwood Dibbles, HOYT Weeks in Treatment: 9 Encounter Discharge Information Items Discharge Pain Level: 0 Discharge Condition: Stable Ambulatory Status: Wheelchair Discharge Destination: Home Transportation: Private Auto Accompanied By: staff Schedule Follow-up Appointment: Yes Medication Reconciliation completed and No provided to Patient/Care Averyanna Sax: Provided on Clinical Summary of Care: 11/20/2016 Form Type Recipient Paper Patient MB Electronic Signature(s) Signed: 11/20/2016 3:40:20 PM By: Curtis Sites Entered By: Curtis Sites on 11/20/2016 15:40:20 [name] ([id number]) -------------------------------------------------------------------------------- Lower Extremity Assessment Details Patient Name: [name]. Date of Service: 11/20/2016 11:00 AM Medical Record Number: [id number] Patient Account Number: [id number] Date of Birth/Sex: 16-Jul-1965 (51 y.o. Female) Treating RN: Curtis Sites Primary Care Ehsan Corvin: Einar Crow Other Clinician: Referring Jeliyah Middlebrooks: Einar Crow Treating Franklyn Cafaro/Extender: Linwood Dibbles, HOYT Weeks in Treatment: 9 Electronic Signature(s) Signed: 11/20/2016 4:29:50 PM By: Curtis Sites Entered By: Curtis Sites on 11/20/2016 11:22:09 [name] ([id number]) -------------------------------------------------------------------------------- Multi Wound Chart Details Patient Name: [name]. Date of Service: 11/20/2016 11:00 AM Medical Record Number: [id number] Patient Account Number: [id number] Date of Birth/Sex: 1965/05/16 (51 y.o. Female) Treating RN: Curtis Sites Primary Care Xayla Puzio: Einar Crow Other Clinician: Referring Kynslie Ringle: Einar Crow Treating Candon Caras/Extender: Linwood Dibbles, HOYT Weeks in Treatment: 9 Vital Signs Height(in): Pulse(bpm): 69 Weight(lbs): Blood Pressure(mmHg):  121/74 Body Mass Index(BMI): Temperature(F): 98.4 Respiratory Rate 20 (breaths/min): Photos: [1R:No Photos] [4:No Photos] [5:No Photos] Wound Location: [1R:Right Hand - Web Between 1st Right Hand - Dorsum - Lateral and 2nd Digit] [5:Right Wrist - Medial] Wounding Event: [1R:Trauma] [4:Bite] [5:Trauma] Primary Etiology: [1R:Trauma, Other] [4:Trauma, Other] [5:Trauma, Other] Comorbid History: [1R:Sleep Apnea, Quadriplegia] [4:Sleep Apnea, Quadriplegia] [5:Sleep Apnea, Quadriplegia] Date Acquired: [1R:08/26/2016] [4:10/23/2016] [5:11/04/2016] Weeks of Treatment: [1R:9] [4:3] [5:2] Wound Status: [1R:Open] [4:Open] [5:Open] Wound Recurrence: [1R:Yes] [4:No] [5:No] Measurements L x W x D [1R:0.5x0.2x0.2] [4:0.5x0.3x0.1] [5:2x1.6x0.1] (cm) Area (cm) : [1R:0.079] [4:0.118] [5:2.513] Volume (cm) : [1R:0.016] [4:0.012] [5:0.251] % Reduction  in Area: [1R:93.70%] [4:85.70%] [5:69.50%] % Reduction in Volume: [1R:87.30%] [4:85.40%] [5:69.60%] Classification: [1R:Partial Thickness] [4:Full Thickness Without Exposed Support Structures] [5:Full Thickness Without Exposed Support Structures] Exudate Amount: [1R:Medium] [4:Medium] [5:Large] Exudate Type: [1R:Serous] [4:Serosanguineous] [5:Serous] Exudate Color: [1R:amber] [4:red, brown] [5:amber] Wound Margin: [1R:Flat and Intact] [4:Flat and Intact] [5:Flat and Intact] Granulation Amount: [1R:Medium (34-66%)] [4:None Present (0%)] [5:Small (1-33%)] Granulation Quality: [1R:Red] [4:N/A] [5:Red] Necrotic Amount: [1R:Medium (34-66%)] [4:Large (67-100%)] [5:Large (67-100%)] Necrotic Tissue: [1R:Adherent Slough] [4:Eschar] [5:Eschar, Adherent Slough] Exposed Structures: [1R:Fat Layer (Subcutaneous Tissue) Exposed: Yes Fascia: No Tendon: No Muscle: No Joint: No Bone: No] [4:Fascia: No Fat Layer (Subcutaneous Tissue) Exposed: No Tendon: No Muscle: No Joint: No Bone: No] [5:Fat Layer (Subcutaneous Tissue) Exposed: Yes  Fascia: No Tendon: No Muscle: No Joint:  No Bone: No] Epithelialization: [1R:None] [4:None] [5:None] Periwound Skin Texture: [1R:Excoriation: No Induration: No Callus: No] [4:No Abnormalities Noted] [5:Excoriation: No Induration: No Callus: No] Crepitus: No Crepitus: No Rash: No Rash: No Scarring: No Scarring: No Periwound Skin Moisture: Maceration: No Dry/Scaly: Yes Maceration: Yes Dry/Scaly: No Dry/Scaly: No Periwound Skin Color: Atrophie Blanche: No Erythema: Yes Erythema: Yes Cyanosis: No Atrophie Blanche: No Ecchymosis: No Cyanosis: No Erythema: No Ecchymosis: No Hemosiderin Staining: No Hemosiderin Staining: No Mottled: No Mottled: No Pallor: No Pallor: No Rubor: No Rubor: No Erythema Location: N/A N/A Circumferential Temperature: Hot Hot Hot Tenderness on Palpation: No No No Wound Preparation: Ulcer Cleansing: Ulcer Cleansing: Ulcer Cleansing: Rinsed/Irrigated with Saline Rinsed/Irrigated with Saline Rinsed/Irrigated with Saline Topical Anesthetic Applied: Topical Anesthetic Applied: Topical Anesthetic Applied: None, Other: Lidocaine 4% None, Other: Lidocaine4% None, Other: Lidocaine4% Wound Number: [id number]  Photos: No Photos No Photos No Photos Wound Location: Right Hand - Web Between Left Hand - Dorsum - Dorsal Left Hand - Web Between 1st 4th and 5th Digit and 2nd Digit Wounding Event: Trauma Trauma Trauma Primary Etiology: Trauma, Other Trauma, Other Trauma, Other Comorbid History: Sleep Apnea, Quadriplegia Sleep Apnea, Quadriplegia Sleep Apnea, Quadriplegia Date Acquired: 11/05/2016 11/05/2016 11/05/2016 Weeks of Treatment: 2 2 2  Wound Status: Open Open Open Wound Recurrence: No No No Measurements L x W x D 0x0x0 1.2x1x0.1 0.1x0.1x0.1 (cm) Area (cm) : 0 0.942 0.008 Volume (cm) : 0 0.094 0.001 % Reduction in Area: 100.00% 87.00% 99.80% % Reduction in Volume: 100.00% 87.10% 99.80% Classification: Partial Thickness Full Thickness Without Full Thickness With Exposed Exposed Support  Structures Support Structures Exudate Amount: None Present Large Large Exudate Type: N/A Serous Serous Exudate Color: N/A amber amber Wound Margin: Flat and Intact Flat and Intact Flat and Intact Granulation Amount: None Present (0%) None Present (0%) None Present (0%) Granulation Quality: N/A N/A N/A Necrotic Amount: None Present (0%) Large (67-100%) Large (67-100%) Necrotic Tissue: N/A Eschar, Adherent Slough Eschar Exposed Structures: Fascia: No Fascia: No Fat Layer (Subcutaneous Fat Layer (Subcutaneous Fat Layer (Subcutaneous Tissue) Exposed: Yes Tissue) Exposed: No Tissue) Exposed: No Fascia: No Tendon: No Tendon: No Tendon: No Muscle: No Muscle: No Muscle: No Joint: No Joint: No Joint: No Bone: No Bone: No Bone: No Epithelialization: Large (67-100%) None None Burnham, Makinsley K. ([id number]) Periwound Skin Texture: Excoriation: No Excoriation: No Excoriation: No Induration: No Induration: No Induration: No Callus: No Callus: No Callus: No Crepitus: No Crepitus: No Crepitus: No Rash: No Rash: No Rash: No Scarring: No Scarring: No Scarring: No Periwound Skin Moisture: Maceration: No Maceration: No Maceration: No Dry/Scaly: No Dry/Scaly: No Dry/Scaly: No Periwound Skin Color: Atrophie Blanche: No Erythema: Yes Atrophie Blanche: No Cyanosis: No Atrophie Blanche: No Cyanosis: No  Ecchymosis: No Cyanosis: No Ecchymosis: No Erythema: No Ecchymosis: No Erythema: No Hemosiderin Staining: No Hemosiderin Staining: No Hemosiderin Staining: No Mottled: No Mottled: No Mottled: No Pallor: No Pallor: No Pallor: No Rubor: No Rubor: No Rubor: No Erythema Location: N/A Circumferential N/A Temperature: Hot Hot Hot Tenderness on Palpation: No No No Wound Preparation: Ulcer Cleansing: Ulcer Cleansing: Ulcer Cleansing: Rinsed/Irrigated with Saline Rinsed/Irrigated with Saline Rinsed/Irrigated with Saline Topical Anesthetic Applied: Topical  Anesthetic Applied: Topical Anesthetic Applied: None None, Other: Lidocaine4% None, Other: Lidocaine4% Treatment Notes Electronic Signature(s) Signed: 11/20/2016 4:29:50 PM By: Curtis Sites Entered By: Curtis Sites on 11/20/2016 11:22:34 [name] ([id number]) -------------------------------------------------------------------------------- Multi-Disciplinary Care Plan Details Patient Name: [name]. Date of Service: 11/20/2016 11:00 AM Medical Record Number: [id number] Patient Account Number: [id number] Date of Birth/Sex: 11-09-1965 (51 y.o. Female) Treating RN: Curtis Sites Primary Care Chesnie Capell: Einar Crow Other Clinician: Referring Arden Axon: Einar Crow Treating Ramses Klecka/Extender: Linwood Dibbles, HOYT Weeks in Treatment: 9 Active Inactive ` Soft Tissue Infection Nursing Diagnoses: Impaired tissue integrity Potential for infection: soft tissue Goals: Patient will remain free of wound infection Date Initiated: 10/30/2016 Target Resolution Date: 11/19/2016 Goal Status: Active Interventions: Assess signs and symptoms of infection every visit Notes: ` Wound/Skin Impairment Nursing Diagnoses: Impaired tissue integrity Goals: Ulcer/skin breakdown will have a volume reduction of 30% by week 4 Date Initiated: 10/30/2016 Target Resolution Date: 11/30/2016 Goal Status: Active Interventions: Assess ulceration(s) every visit Treatment Activities: Topical wound management initiated : 10/30/2016 Notes: Electronic Signature(s) Signed: 11/20/2016 4:29:50 PM By: Curtis Sites Entered By: Curtis Sites on 11/20/2016 11:22:14 [name] ([id number]) -------------------------------------------------------------------------------- Pain Assessment Details Patient Name: [name]. Date of Service: 11/20/2016 11:00 AM Medical Record Number: [id number] Patient Account Number: [id number] Date of Birth/Sex: 1965-02-24 (51 y.o.  Female) Treating RN: Curtis Sites Primary Care Nazim Kadlec: Einar Crow Other Clinician: Referring Rosaria Kubin: Einar Crow Treating Aribella Vavra/Extender: Linwood Dibbles, HOYT Weeks in Treatment: 9 Active Problems Location of Pain Severity and Description of Pain Patient Has Paino Patient Unable to Respond Site Locations Pain Management and Medication Current Pain Management: Electronic Signature(s) Signed: 11/20/2016 4:29:50 PM By: Curtis Sites Entered By: Curtis Sites on 11/20/2016 11:06:35 [name] ([id number]) -------------------------------------------------------------------------------- Patient/Caregiver Education Details Patient Name: [name]. Date of Service: 11/20/2016 11:00 AM Medical Record Number: [id number] Patient Account Number: [id number] Date of Birth/Gender: 06-Jan-1966 (51 y.o. Female) Treating RN: Curtis Sites Primary Care Physician: Einar Crow Other Clinician: Referring Physician: Einar Crow Treating Physician/Extender: Linwood Dibbles, HOYT Weeks in Treatment: 9 Education Assessment Education Provided To: Caregiver Education Topics Provided Wound/Skin Impairment: Handouts: Other: wound care as ordedered Methods: Demonstration, Explain/Verbal, Printed Responses: State content correctly [employer]) Signed: 11/20/2016 4:29:50 PM By: Curtis Sites Entered By: Curtis Sites on 11/20/2016 15:40:42 [place], [name] ([id number]) -------------------------------------------------------------------------------- Wound Assessment Details Patient Name: [name]. Date of Service: 11/20/2016 11:00 AM Medical Record Number: [id number] Patient Account Number: [id number] Date of Birth/Sex: May 30, 1965 (51 y.o. Female) Treating RN: Curtis Sites Primary Care Maylee Bare: Einar Crow Other Clinician: Referring Lebaron Bautch: Einar Crow Treating Jereld Presti/Extender: Linwood Dibbles, HOYT Weeks in Treatment:  9 Wound Status Wound Number: 1R Primary Etiology: Trauma, Other Wound Location: Right Hand - Web Between 1st and 2nd Wound Status: Open Digit Comorbid History: Sleep Apnea, Quadriplegia Wounding Event: Trauma Date Acquired: 08/26/2016 Weeks Of Treatment: 9 Clustered Wound: No Photos Photo Uploaded By: Curtis Sites on 11/20/2016 15:10:43 Wound Measurements Length: (cm) 0.5 Width: (cm) 0.2 Depth: (cm) 0.2 Area: (cm) 0.079 Volume: (cm) 0.016 % Reduction in Area:  93.7% % Reduction in Volume: 87.3% Epithelialization: None Tunneling: No Undermining: No Wound Description Classification: Partial Thickness Wound Margin: Flat and Intact Exudate Amount: Medium Exudate Type: Serous Exudate Color: amber Foul Odor After Cleansing: No Slough/Fibrino No Wound Bed Granulation Amount: Medium (34-66%) Exposed Structure Granulation Quality: Red Fascia Exposed: No Necrotic Amount: Medium (34-66%) Fat Layer (Subcutaneous Tissue) Exposed: Yes Necrotic Quality: Adherent Slough Tendon Exposed: No Muscle Exposed: No Joint Exposed: No Bone Exposed: No Periwound Skin Texture Balding, Heran K. ([id number]) Texture Color No Abnormalities Noted: No No Abnormalities Noted: No Callus: No Atrophie Blanche: No Crepitus: No Cyanosis: No Excoriation: No Ecchymosis: No Induration: No Erythema: No Rash: No Hemosiderin Staining: No Scarring: No Mottled: No Pallor: No Moisture Rubor: No No Abnormalities Noted: No Dry / Scaly: No Temperature / Pain Maceration: No Temperature: Hot Wound Preparation Ulcer Cleansing: Rinsed/Irrigated with Saline Topical Anesthetic Applied: None, Other: Lidocaine 4%, Treatment Notes Wound #1R (Right Hand - Web Between 1st and 2nd Digit) 1. Cleansed with: Clean wound with Normal Saline 4. Dressing Applied: Aquacel Ag Notes Conform secured with tape applied to hands. Electronic Signature(s) Signed: 11/20/2016 4:29:50 PM By: Curtis Sites Entered  By: Curtis Sites on 11/20/2016 11:16:05 [name] ([id number]) -------------------------------------------------------------------------------- Wound Assessment Details Patient Name: [name]. Date of Service: 11/20/2016 11:00 AM Medical Record Number: [id number] Patient Account Number: [id number] Date of Birth/Sex: 1965-06-05 (51 y.o. Female) Treating RN: Curtis Sites Primary Care Mckenleigh Tarlton: Einar Crow Other Clinician: Referring Lawren Sexson: Einar Crow Treating Makena Murdock/Extender: Linwood Dibbles, HOYT Weeks in Treatment: 9 Wound Status Wound Number: 4 Primary Etiology: Trauma, Other Wound Location: Right Hand - Dorsum - Lateral Wound Status: Open Wounding Event: Bite Comorbid History: Sleep Apnea, Quadriplegia Date Acquired: 10/23/2016 Weeks Of Treatment: 3 Clustered Wound: No Photos Photo Uploaded By: Curtis Sites on 11/20/2016 15:10:44 Wound Measurements Length: (cm) 0.5 Width: (cm) 0.3 Depth: (cm) 0.1 Area: (cm) 0.118 Volume: (cm) 0.012 % Reduction in Area: 85.7% % Reduction in Volume: 85.4% Epithelialization: None Tunneling: No Undermining: No Wound Description Full Thickness Without Exposed Support Classification: Structures Wound Margin: Flat and Intact Exudate Medium Amount: Exudate Type: Serosanguineous Exudate Color: red, brown Foul Odor After Cleansing: No Slough/Fibrino No Wound Bed Granulation Amount: None Present (0%) Exposed Structure Necrotic Amount: Large (67-100%) Fascia Exposed: No Necrotic Quality: Eschar Fat Layer (Subcutaneous Tissue) Exposed: No Tendon Exposed: No Muscle Exposed: No Joint Exposed: No Bone Exposed: No Palen, Arisa K. ([id number]) Periwound Skin Texture Texture Color No Abnormalities Noted: No No Abnormalities Noted: No Erythema: Yes Moisture No Abnormalities Noted: No Temperature / Pain Dry / Scaly: Yes Temperature: Hot Wound Preparation Ulcer Cleansing: Rinsed/Irrigated with  Saline Topical Anesthetic Applied: None, Other: Lidocaine4%, Treatment Notes Wound #4 (Right, Lateral Hand - Dorsum) 1. Cleansed with: Clean wound with Normal Saline 4. Dressing Applied: Aquacel Ag Notes Conform secured with tape applied to hands. Electronic Signature(s) Signed: 11/20/2016 4:29:50 PM By: Curtis Sites Entered By: Curtis Sites on 11/20/2016 11:16:46 [place], [name] ([id number]) -------------------------------------------------------------------------------- Wound Assessment Details Patient Name: [name]. Date of Service: 11/20/2016 11:00 AM Medical Record Number: [id number] Patient Account Number: [id number] Date of Birth/Sex: 08-29-1965 (51 y.o. Female) Treating RN: Curtis Sites Primary Care Norita Meigs: Einar Crow Other Clinician: Referring Brennden Masten: Einar Crow Treating Quetzally Callas/Extender: STONE III, HOYT Weeks in Treatment: 9 Wound Status Wound Number: 5 Primary Etiology: Trauma, Other Wound Location: Right Wrist - Medial Wound Status: Open Wounding Event: Trauma Comorbid History: Sleep Apnea, Quadriplegia Date Acquired: 11/04/2016 Weeks Of Treatment: 2 Clustered Wound: No Photos  Photo Uploaded By: Curtis Sites on 11/20/2016 15:11:32 Wound Measurements Length: (cm) 2 Width: (cm) 1.6 Depth: (cm) 0.1 Area: (cm) 2.513 Volume: (cm) 0.251 % Reduction in Area: 69.5% % Reduction in Volume: 69.6% Epithelialization: None Tunneling: No Undermining: No Wound Description Full Thickness Without Exposed Support Classification: Structures Wound Margin: Flat and Intact Exudate Large Amount: Exudate Type: Serous Exudate Color: amber Foul Odor After Cleansing: No Slough/Fibrino Yes Wound Bed Granulation Amount: Small (1-33%) Exposed Structure Granulation Quality: Red Fascia Exposed: No Necrotic Amount: Large (67-100%) Fat Layer (Subcutaneous Tissue) Exposed: Yes Necrotic Quality: Eschar, Adherent Slough Tendon  Exposed: No Muscle Exposed: No Joint Exposed: No Bone Exposed: No Neddo, Charli K. ([id number]) Periwound Skin Texture Texture Color No Abnormalities Noted: No No Abnormalities Noted: No Callus: No Atrophie Blanche: No Crepitus: No Cyanosis: No Excoriation: No Ecchymosis: No Induration: No Erythema: Yes Rash: No Erythema Location: Circumferential Scarring: No Hemosiderin Staining: No Mottled: No Moisture Pallor: No No Abnormalities Noted: No Rubor: No Dry / Scaly: No Maceration: Yes Temperature / Pain Temperature: Hot Wound Preparation Ulcer Cleansing: Rinsed/Irrigated with Saline Topical Anesthetic Applied: None, Other: Lidocaine4%, Treatment Notes Wound #5 (Right, Medial Wrist) 1. Cleansed with: Clean wound with Normal Saline 4. Dressing Applied: Aquacel Ag Notes Conform secured with tape applied to hands. Electronic Signature(s) Signed: 11/20/2016 4:29:50 PM By: Curtis Sites Entered By: Curtis Sites on 11/20/2016 11:17:48 [name] ([id number]) -------------------------------------------------------------------------------- Wound Assessment Details Patient Name: [name]. Date of Service: 11/20/2016 11:00 AM Medical Record Number: [id number] Patient Account Number: [id number] Date of Birth/Sex: 05/29/1965 (51 y.o. Female) Treating RN: Curtis Sites Primary Care Aeriana Speece: Einar Crow Other Clinician: Referring Tuana Hoheisel: Einar Crow Treating Maleeah Crossman/Extender: Linwood Dibbles, HOYT Weeks in Treatment: 9 Wound Status Wound Number: 6 Primary Etiology: Trauma, Other Wound Location: Right Hand - Web Between 4th and 5th Wound Status: Open Digit Comorbid History: Sleep Apnea, Quadriplegia Wounding Event: Trauma Date Acquired: 11/05/2016 Weeks Of Treatment: 2 Clustered Wound: No Photos Photo Uploaded By: Curtis Sites on 11/20/2016 15:11:33 Wound Measurements Length: (cm) 0 % Red Width: (cm) 0 % Red Depth: (cm) 0  Epith Area: (cm) 0 Tunn Volume: (cm) 0 Unde uction in Area: 100% uction in Volume: 100% elialization: Large (67-100%) eling: No rmining: No Wound Description Classification: Partial Thickness Wound Margin: Flat and Intact Exudate Amount: None Present Foul Odor After Cleansing: No Slough/Fibrino No Wound Bed Granulation Amount: None Present (0%) Exposed Structure Necrotic Amount: None Present (0%) Fascia Exposed: No Fat Layer (Subcutaneous Tissue) Exposed: No Tendon Exposed: No Muscle Exposed: No Joint Exposed: No Bone Exposed: No Periwound Skin Texture Texture Color Paule, Caily K. ([id number]) No Abnormalities Noted: No No Abnormalities Noted: No Callus: No Atrophie Blanche: No Crepitus: No Cyanosis: No Excoriation: No Ecchymosis: No Induration: No Erythema: No Rash: No Hemosiderin Staining: No Scarring: No Mottled: No Pallor: No Moisture Rubor: No No Abnormalities Noted: No Dry / Scaly: No Temperature / Pain Maceration: No Temperature: Hot Wound Preparation Ulcer Cleansing: Rinsed/Irrigated with Saline Topical Anesthetic Applied: None Electronic Signature(s) Signed: 11/20/2016 4:29:50 PM By: Curtis Sites Entered By: Curtis Sites on 11/20/2016 11:18:31 Sekula, [name] ([id number]) -------------------------------------------------------------------------------- Wound Assessment Details Patient Name: Vernona Rieger K. Date of Service: 11/20/2016 11:00 AM Medical Record Number: [id number] Patient Account Number: [id number] Date of Birth/Sex: 24-Jul-1965 (51 y.o. Female) Treating RN: Curtis Sites Primary Care Triston Skare: Einar Crow Other Clinician: Referring Toan Mort: Einar Crow Treating Yurem Viner/Extender: STONE III, HOYT Weeks in Treatment: 9 Wound Status Wound Number: 7 Primary Etiology: Trauma, Other Wound Location: Left  Hand - Dorsum - Dorsal Wound Status: Open Wounding Event: Trauma Comorbid History: Sleep Apnea,  Quadriplegia Date Acquired: 11/05/2016 Weeks Of Treatment: 2 Clustered Wound: No Photos Photo Uploaded By: Curtis Sites on 11/20/2016 15:12:08 Wound Measurements Length: (cm) 1.2 Width: (cm) 1 Depth: (cm) 0.1 Area: (cm) 0.942 Volume: (cm) 0.094 % Reduction in Area: 87% % Reduction in Volume: 87.1% Epithelialization: None Tunneling: No Undermining: No Wound Description Full Thickness Without Exposed Support Classification: Structures Wound Margin: Flat and Intact Exudate Large Amount: Exudate Type: Serous Exudate Color: amber Foul Odor After Cleansing: No Slough/Fibrino Yes Wound Bed Granulation Amount: None Present (0%) Exposed Structure Necrotic Amount: Large (67-100%) Fascia Exposed: No Necrotic Quality: Eschar, Adherent Slough Fat Layer (Subcutaneous Tissue) Exposed: No Tendon Exposed: No Muscle Exposed: No Joint Exposed: No Bone Exposed: No Conerly, Brielyn K. ([id number]) Periwound Skin Texture Texture Color No Abnormalities Noted: No No Abnormalities Noted: No Callus: No Atrophie Blanche: No Crepitus: No Cyanosis: No Excoriation: No Ecchymosis: No Induration: No Erythema: Yes Rash: No Erythema Location: Circumferential Scarring: No Hemosiderin Staining: No Mottled: No Moisture Pallor: No No Abnormalities Noted: No Rubor: No Dry / Scaly: No Maceration: No Temperature / Pain Temperature: Hot Wound Preparation Ulcer Cleansing: Rinsed/Irrigated with Saline Topical Anesthetic Applied: None, Other: Lidocaine4%, Treatment Notes Wound #7 (Left, Dorsal Hand - Dorsum) 1. Cleansed with: Clean wound with Normal Saline 4. Dressing Applied: Aquacel Ag Notes Conform secured with tape applied to hands. Electronic Signature(s) Signed: 11/20/2016 4:29:50 PM By: Curtis Sites Entered By: Curtis Sites on 11/20/2016 11:20:53 [name] ([id number]) -------------------------------------------------------------------------------- Wound  Assessment Details Patient Name: [name]. Date of Service: 11/20/2016 11:00 AM Medical Record Number: [id number] Patient Account Number: [id number] Date of Birth/Sex: 31-May-1965 (51 y.o. Female) Treating RN: Curtis Sites Primary Care Lorian Yaun: Einar Crow Other Clinician: Referring Terald Jump: Einar Crow Treating Abraham Entwistle/Extender: Linwood Dibbles, HOYT Weeks in Treatment: 9 Wound Status Wound Number: 8 Primary Etiology: Trauma, Other Wound Location: Left Hand - Web Between 1st and 2nd Wound Status: Open Digit Comorbid History: Sleep Apnea, Quadriplegia Wounding Event: Trauma Date Acquired: 11/05/2016 Weeks Of Treatment: 2 Clustered Wound: No Photos Photo Uploaded By: Curtis Sites on 11/20/2016 15:12:09 Wound Measurements Length: (cm) 0.1 Width: (cm) 0.1 Depth: (cm) 0.1 Area: (cm) 0.008 Volume: (cm) 0.001 % Reduction in Area: 99.8% % Reduction in Volume: 99.8% Epithelialization: None Tunneling: No Undermining: No Wound Description Full Thickness With Exposed Support Classification: Structures Wound Margin: Flat and Intact Exudate Large Amount: Exudate Type: Serous Exudate Color: amber Foul Odor After Cleansing: No Slough/Fibrino Yes Wound Bed Granulation Amount: None Present (0%) Exposed Structure Necrotic Amount: Large (67-100%) Fascia Exposed: No Necrotic Quality: Eschar Fat Layer (Subcutaneous Tissue) Exposed: Yes Tendon Exposed: No Muscle Exposed: No Joint Exposed: No Bone Exposed: No Henrikson, Oluwatomisin K. ([id number]) Periwound Skin Texture Texture Color No Abnormalities Noted: No No Abnormalities Noted: No Callus: No Atrophie Blanche: No Crepitus: No Cyanosis: No Excoriation: No Ecchymosis: No Induration: No Erythema: No Rash: No Hemosiderin Staining: No Scarring: No Mottled: No Pallor: No Moisture Rubor: No No Abnormalities Noted: No Dry / Scaly: No Temperature / Pain Maceration: No Temperature: Hot Wound  Preparation Ulcer Cleansing: Rinsed/Irrigated with Saline Topical Anesthetic Applied: None, Other: Lidocaine4%, Treatment Notes Wound #8 (Left Hand - Web Between 1st and 2nd Digit) 1. Cleansed with: Clean wound with Normal Saline 4. Dressing Applied: Aquacel Ag Notes Conform secured with tape applied to hands. Electronic Signature(s) Signed: 11/20/2016 4:29:50 PM By: Curtis Sites Entered By: Curtis Sites on 11/20/2016 11:21:38 Mouser,  Talayah K. ([id number]) -------------------------------------------------------------------------------- Vitals Details Patient Name: NNEOMA, HARRAL. Date of Service: 11/20/2016 11:00 AM Medical Record Number: [id number] Patient Account Number: [id number] Date of Birth/Sex: 12-06-1965 (51 y.o. Female) Treating RN: Curtis Sites Primary Care Undine Nealis: Einar Crow Other Clinician: Referring Jesus Poplin: Einar Crow Treating Dawood Spitler/Extender: Linwood Dibbles, HOYT Weeks in Treatment: 9 Vital Signs Time Taken: 11:06 Temperature (F): 98.4 Pulse (bpm): 69 Respiratory Rate (breaths/min): 20 Blood Pressure (mmHg): 121/74 Reference Range: 80 - 120 mg / dl Notes temp taken axillary Electronic Signature(s) Signed: 11/20/2016 4:29:50 PM By: Curtis Sites Entered By: Curtis Sites on 11/20/2016 11:10:43

## 2016-11-22 NOTE — Progress Notes (Signed)
SAMMIE, SCHERMERHORN ([id number]) Visit Report for 11/20/2016 Chief Complaint Document Details Patient Name: [name], [name]. Date of Service: 11/20/2016 11:00 AM Medical Record Number: [id number] Patient Account Number: [id number] Date of Birth/Sex: 1965-07-27 (51 y.o. Female) Treating RN: Curtis Sites Primary Care Provider: Einar Crow Other Clinician: Referring Provider: Einar Crow Treating Provider/Extender: Linwood Dibbles, HOYT Weeks in Treatment: 9 Information Obtained from: Patient Chief Complaint 09/18/16; patient is here for review of wounds on her bilateral hands which are presumed to be self-inflicted bite wounds Electronic Signature(s) Signed: 11/20/2016 12:07:21 PM By: Lenda Kelp PA-C Entered By: Lenda Kelp on 11/20/2016 11:26:17 [name] ([id number]) -------------------------------------------------------------------------------- HPI Details Patient Name: [name]. Date of Service: 11/20/2016 11:00 AM Medical Record Number: [id number] Patient Account Number: [id number] Date of Birth/Sex: November 02, 1965 (51 y.o. Female) Treating RN: Curtis Sites Primary Care Provider: Einar Crow Other Clinician: Referring Provider: Einar Crow Treating Provider/Extender: Linwood Dibbles, HOYT Weeks in Treatment: 9 History of Present Illness HPI Description: 09/18/16; this is a 51 year old woman with severe mental retardation, cerebral palsy. She apparently has a long history of repetitive movements of her hands back and forth to her mouth predominantly involving the lateral aspect of the hands especially the inner phalangeal area between the thumb and first fingers bilaterally. She was recently put on Augmentin by primary care for concern about cellulitis. She lives at [facility]. She has a history of cerebral palsy, mental retardation, functional quadriplegia although she has reasonable use of the left greater than right arm. She  also has seizure disorder 10/02/16; the facility has obtained her gloves for her hands this is really helped. There is nothing open on the right hand however in the webspace between her thumb and first finger purulent drainage noted by her intake nurse. Small probing wound. 10/09/16; culture I did last week showed methicillin sensitive staph aureus that should've been well covered by the Augmentin. I gave her. In the meantime the facility where she is seems to be concerned about the glove. They gave her last time is being some form of restraint which is an issue, I'm well familiar with. We have been using silver alginate to the wound in the webspace between her thumb and first finger. Still looking at the left hand in the webspace between the him and first finger. 10/15/16; wounds in the left first and webspace caused by recurrent bite/leaking injury has resolved. There is no evidence of infection. The facility is obtained really nice gloves to protect her hands. These can be removed for bathing and at mealtimes with the patient is apparently able to participate in feeding. 10/30/16; apparently the last time she was here there was a wound on her right lateral hand however we did not look at this underneath her glove. She arrived today in clinic with wraps very tightly around both hands. 11/05/16; patient arrived today with both hands in a very poor state. Firstly on the right she had swelling redness and pain on the top of her hand and especially involving the right. This is indicative of cellulitis. Necrotic wound in the webspace between the thumb and the first finger, fourth and fifth finger and a large superficial wound over the dorsal right hand. oOn the left that she had 2 wounds one on the dorsal hand and one in the webspace first and second digits. oMost concerning thing is the degree of swelling erythema and tenderness on the dorsal right hand 11/13/16; some improvement in the cellulitis on  the dorsal right hand otherwise most of her wounds look roughly the same. Culture I took from the right first second web space grew MSSA which should've been sensitive to the Augmentin I gave her empirically last week. The fact that the edema and erythema on the top of her hand is better suggest the Augmentin was effective. We have been using silver alginate to all of her wounds 11/20/16 on evaluation today patient's wounds of the bilateral hands appear to be doing better in general. She has been keeping these wrapped or least her caregivers have. She does appear to have some pain with cleansing of the wounds today. However this is minimal. No fevers, chills, nausea, or vomiting noted at this time. Electronic Signature(s) Signed: 11/20/2016 12:07:21 PM By: Lenda Kelp PA-C Entered By: Lenda Kelp on 11/20/2016 11:57:13 [name] ([id number]) -------------------------------------------------------------------------------- Physical Exam Details Patient Name: [name]. Date of Service: 11/20/2016 11:00 AM Medical Record Number: [id number] Patient Account Number: [id number] Date of Birth/Sex: Feb 21, 1965 (51 y.o. Female) Treating RN: Curtis Sites Primary Care Provider: Einar Crow Other Clinician: Referring Provider: Einar Crow Treating Provider/Extender: STONE III, HOYT Weeks in Treatment: 9 Constitutional Well-nourished and well-hydrated in no acute distress. Respiratory normal breathing without difficulty. clear to auscultation bilaterally. Cardiovascular regular rate and rhythm with normal S1, S2. Psychiatric Patient is not able to cooperate in decision making regarding care. Patient is oriented to person only. patient is agitated but cooperative. Notes I was actually able to cleanse off some of the eschar over the bilateral hands with saline and gauze which he tolerated well today. No evidence of significant infection and a lot of the areas  have either closed or are drying out significantly without any complication. Electronic Signature(s) Signed: 11/20/2016 12:07:21 PM By: Lenda Kelp PA-C Entered By: Lenda Kelp on 11/20/2016 12:00:46 [name] ([id number]) -------------------------------------------------------------------------------- Physician Orders Details Patient Name: [name]. Date of Service: 11/20/2016 11:00 AM Medical Record Number: [id number] Patient Account Number: [id number] Date of Birth/Sex: 03-27-1965 (51 y.o. Female) Treating RN: Curtis Sites Primary Care Provider: Einar Crow Other Clinician: Referring Provider: Einar Crow Treating Provider/Extender: Linwood Dibbles, HOYT Weeks in Treatment: 9 Verbal / Phone Orders: No Diagnosis Coding ICD-10 Coding Code Description S60.571D Other superficial bite of hand of right hand, subsequent encounter S60.571D Other superficial bite of hand of right hand, subsequent encounter L03.113 Cellulitis of right upper limb Wound Cleansing Wound #1R Right Hand - Web Between 1st and 2nd Digit o Clean wound with Normal Saline. o Cleanse wound with mild soap and water Wound #4 Right,Lateral Hand - Dorsum o Clean wound with Normal Saline. o Cleanse wound with mild soap and water Wound #5 Right,Medial Wrist o Clean wound with Normal Saline. o Cleanse wound with mild soap and water Wound #7 Left,Dorsal Hand - Dorsum o Clean wound with Normal Saline. o Cleanse wound with mild soap and water Wound #8 Left Hand - Web Between 1st and 2nd Digit o Clean wound with Normal Saline. o Cleanse wound with mild soap and water Anesthetic Wound #1R Right Hand - Web Between 1st and 2nd Digit o Topical Lidocaine 4% cream applied to wound bed prior to debridement Wound #4 Right,Lateral Hand - Dorsum o Topical Lidocaine 4% cream applied to wound bed prior to debridement Wound #5 Right,Medial Wrist o Topical Lidocaine 4%  cream applied to wound bed prior to debridement Wound #7 Left,Dorsal Hand - Dorsum o Topical Lidocaine 4% cream applied to wound bed prior to  debridement Wound #8 Left Hand - Web Between 1st and 2nd Digit o Topical Lidocaine 4% cream applied to wound bed prior to debridement Primary Wound Dressing [name], [name] ([id number]) Wound #1R Right Hand - Web Between 1st and 2nd Digit o Other: - Silvercell Ag or equal Wound #4 Right,Lateral Hand - Dorsum o Other: - Silvercell Ag or equal Wound #5 Right,Medial Wrist o Other: - Silvercell Ag or equal Wound #7 Left,Dorsal Hand - Dorsum o Other: - Silvercell Ag or equal Wound #8 Left Hand - Web Between 1st and 2nd Digit o Other: - Silvercell Ag or equal Secondary Dressing Wound #1R Right Hand - Web Between 1st and 2nd Digit o ABD pad o Conform/Kerlix - DO NOT USE COBAN! Wound #4 Right,Lateral Hand - Dorsum o ABD pad o Conform/Kerlix - DO NOT USE COBAN! Wound #5 Right,Medial Wrist o ABD pad o Conform/Kerlix - DO NOT USE COBAN! Wound #7 Left,Dorsal Hand - Dorsum o ABD pad o Conform/Kerlix - DO NOT USE COBAN! Wound #8 Left Hand - Web Between 1st and 2nd Digit o ABD pad o Conform/Kerlix - DO NOT USE COBAN! Dressing Change Frequency Wound #1R Right Hand - Web Between 1st and 2nd Digit o Change dressing every other day. Wound #4 Right,Lateral Hand - Dorsum o Change dressing every other day. Wound #5 Right,Medial Wrist o Change dressing every other day. Wound #7 Left,Dorsal Hand - Dorsum o Change dressing every other day. Wound #8 Left Hand - Web Between 1st and 2nd Digit o Change dressing every other day. Follow-up Appointments Wound #1R Right Hand - Web Between 1st and 2nd Digit o Return Appointment in 1 week. [name], Desarae K. ([id number]) Wound #4 Right,Lateral Hand - Dorsum o Return Appointment in 1 week. Wound #5 Right,Medial Wrist o Return Appointment in 1 week. Wound #7  Left,Dorsal Hand - Dorsum o Return Appointment in 1 week. Wound #8 Left Hand - Web Between 1st and 2nd Digit o Return Appointment in 1 week. Electronic Signature(s) Signed: 11/20/2016 12:07:21 PM By: Lenda KelpStone III, Hoyt PA-C Signed: 11/20/2016 4:29:50 PM By: Curtis Sitesorthy, Joanna Entered By: Curtis Sitesorthy, Joanna on 11/20/2016 11:37:16 [name], [name] ([id number]) -------------------------------------------------------------------------------- Problem List Details Patient Name: [name], [name] Date of Service: 11/20/2016 11:00 AM Medical Record Number: [id number] Patient Account Number: [id number] Date of Birth/Sex: 1965/07/06 (51 y.o. Female) Treating RN: Curtis Sitesorthy, Joanna Primary Care Provider: Einar CrowAnderson, Marshall Other Clinician: Referring Provider: Einar CrowAnderson, Marshall Treating Provider/Extender: Linwood DibblesSTONE III, HOYT Weeks in Treatment: 9 Active Problems ICD-10 Encounter Code Description Active Date Diagnosis S60.571D Other superficial bite of hand of right hand, subsequent encounter 09/18/2016 Yes S60.571D Other superficial bite of hand of right hand, subsequent encounter 10/30/2016 Yes L03.113 Cellulitis of right upper limb 11/05/2016 Yes Inactive Problems ICD-10 Code Description Active Date Inactive Date S60.572D Other superficial bite of hand of left hand, subsequent encounter 09/18/2016 09/18/2016 L03.113 Cellulitis of right upper limb 09/18/2016 09/18/2016 L03.114 Cellulitis of left upper limb 09/18/2016 09/18/2016 Resolved Problems Electronic Signature(s) Signed: 11/20/2016 12:07:21 PM By: Lenda KelpStone III, Hoyt PA-C Entered By: Lenda KelpStone III, Hoyt on 11/20/2016 11:19:48 [name], Carolyn LadeMARGARET K. ([id number]) -------------------------------------------------------------------------------- Progress Note Details Patient Name: [name], Houa K. Date of Service: 11/20/2016 11:00 AM Medical Record Number: [id number] Patient Account Number: [id number] Date of Birth/Sex: 1965/07/06 (51 y.o. Female) Treating RN:  Curtis Sitesorthy, Joanna Primary Care Provider: Einar CrowAnderson, Marshall Other Clinician: Referring Provider: Einar CrowAnderson, Marshall Treating Provider/Extender: Linwood DibblesSTONE III, HOYT Weeks in Treatment: 9 Subjective Chief Complaint Information obtained from Patient 09/18/16; patient is here for review of wounds on  her bilateral hands which are presumed to be self-inflicted bite wounds History of Present Illness (HPI) 09/18/16; this is a 51 year old woman with severe mental retardation, cerebral palsy. She apparently has a long history of repetitive movements of her hands back and forth to her mouth predominantly involving the lateral aspect of the hands especially the inner phalangeal area between the thumb and first fingers bilaterally. She was recently put on Augmentin by primary care for concern about cellulitis. She lives at [facility]. She has a history of cerebral palsy, mental retardation, functional quadriplegia although she has reasonable use of the left greater than right arm. She also has seizure disorder 10/02/16; the facility has obtained her gloves for her hands this is really helped. There is nothing open on the right hand however in the webspace between her thumb and first finger purulent drainage noted by her intake nurse. Small probing wound. 10/09/16; culture I did last week showed methicillin sensitive staph aureus that should've been well covered by the Augmentin. I gave her. In the meantime the facility where she is seems to be concerned about the glove. They gave her last time is being some form of restraint which is an issue, I'm well familiar with. We have been using silver alginate to the wound in the webspace between her thumb and first finger. Still looking at the left hand in the webspace between the him and first finger. 10/15/16; wounds in the left first and webspace caused by recurrent bite/leaking injury has resolved. There is no evidence of infection. The facility is obtained  really nice gloves to protect her hands. These can be removed for bathing and at mealtimes with the patient is apparently able to participate in feeding. 10/30/16; apparently the last time she was here there was a wound on her right lateral hand however we did not look at this underneath her glove. She arrived today in clinic with wraps very tightly around both hands. 11/05/16; patient arrived today with both hands in a very poor state. Firstly on the right she had swelling redness and pain on the top of her hand and especially involving the right. This is indicative of cellulitis. Necrotic wound in the webspace between the thumb and the first finger, fourth and fifth finger and a large superficial wound over the dorsal right hand. On the left that she had 2 wounds one on the dorsal hand and one in the webspace first and second digits. Most concerning thing is the degree of swelling erythema and tenderness on the dorsal right hand 11/13/16; some improvement in the cellulitis on the dorsal right hand otherwise most of her wounds look roughly the same. Culture I took from the right first second web space grew MSSA which should've been sensitive to the Augmentin I gave her empirically last week. The fact that the edema and erythema on the top of her hand is better suggest the Augmentin was effective. We have been using silver alginate to all of her wounds 11/20/16 on evaluation today patient's wounds of the bilateral hands appear to be doing better in general. She has been keeping these wrapped or least her caregivers have. She does appear to have some pain with cleansing of the wounds today. However this is minimal. No fevers, chills, nausea, or vomiting noted at this time. Patient History Unable to Obtain Patient History due to Altered Mental Status. Information obtained from Patient. Social History [name], [name] ([id number]) Never smoker, Marital Status - Single, Alcohol Use -  Never, Drug  Use - No History, Caffeine Use - Never. Medical And Surgical History Notes Constitutional Symptoms (General Health) Seizure Disorder; CP; mental retardation, quadriplegia; seizures thrombocytopenia Review of Systems (ROS) Constitutional Symptoms (General Health) Denies complaints or symptoms of Fever, Chills. Respiratory The patient has no complaints or symptoms. Cardiovascular The patient has no complaints or symptoms. General Notes: [place] to mental status patient is unable to provide a complete review of systems, social history, family history, and past medical history. Objective Constitutional Well-nourished and well-hydrated in no acute distress. Vitals Time Taken: 11:06 AM, Temperature: 98.4 F, Pulse: 69 bpm, Respiratory Rate: 20 breaths/min, Blood Pressure: 121/74 mmHg. General Notes: temp taken axillary Respiratory normal breathing without difficulty. clear to auscultation bilaterally. Cardiovascular regular rate and rhythm with normal S1, S2. Psychiatric Patient is not able to cooperate in decision making regarding care. Patient is oriented to person only. patient is agitated but cooperative. General Notes: I was actually able to cleanse off some of the eschar over the bilateral hands with saline and gauze which he tolerated well today. No evidence of significant infection and a lot of the areas have either closed or are drying out significantly without any complication. Integumentary (Hair, Skin) Wound #1R status is Open. Original cause of wound was Trauma. The wound is located on the Right Hand - Web Between 1st and 2nd Digit. The wound measures 0.5cm length x 0.2cm width x 0.2cm depth; 0.079cm^2 area and 0.016cm^3 volume. There is Fat Layer (Subcutaneous Tissue) Exposed exposed. There is no tunneling or undermining noted. There is a medium amount of serous drainage noted. The wound margin is flat and intact. There is medium (34-66%) red granulation within the wound  bed. There is a medium (34-66%) amount of necrotic tissue within the wound bed including Adherent Slough. The periwound skin appearance did not exhibit: Callus, Crepitus, Excoriation, Induration, Rash, Scarring, Dry/Scaly, Maceration, Atrophie Blanche, Cyanosis, Ecchymosis, Hemosiderin Staining, Mottled, Pallor, Rubor, Erythema. Periwound temperature was noted as Hot. [name], [name] ([id number]) Wound #4 status is Open. Original cause of wound was Bite. The wound is located on the Right,Lateral Hand - Dorsum. The wound measures 0.5cm length x 0.3cm width x 0.1cm depth; 0.118cm^2 area and 0.012cm^3 volume. There is no tunneling or undermining noted. There is a medium amount of serosanguineous drainage noted. The wound margin is flat and intact. There is no granulation within the wound bed. There is a large (67-100%) amount of necrotic tissue within the wound bed including Eschar. The periwound skin appearance exhibited: Dry/Scaly, Erythema. The surrounding wound skin color is noted with erythema. Periwound temperature was noted as Hot. Wound #5 status is Open. Original cause of wound was Trauma. The wound is located on the Right,Medial Wrist. The wound measures 2cm length x 1.6cm width x 0.1cm depth; 2.513cm^2 area and 0.251cm^3 volume. There is Fat Layer (Subcutaneous Tissue) Exposed exposed. There is no tunneling or undermining noted. There is a large amount of serous drainage noted. The wound margin is flat and intact. There is small (1-33%) red granulation within the wound bed. There is a large (67-100%) amount of necrotic tissue within the wound bed including Eschar and Adherent Slough. The periwound skin appearance exhibited: Maceration, Erythema. The periwound skin appearance did not exhibit: Callus, Crepitus, Excoriation, Induration, Rash, Scarring, Dry/Scaly, Atrophie Blanche, Cyanosis, Ecchymosis, Hemosiderin Staining, Mottled, Pallor, Rubor. The surrounding wound skin color is noted  with erythema which is circumferential. Periwound temperature was noted as Hot. Wound #6 status is Open. Original cause of wound  was Trauma. The wound is located on the Right Hand - Web Between 4th and 5th Digit. The wound measures 0cm length x 0cm width x 0cm depth; 0cm^2 area and 0cm^3 volume. There is no tunneling or undermining noted. There is a none present amount of drainage noted. The wound margin is flat and intact. There is no granulation within the wound bed. There is no necrotic tissue within the wound bed. The periwound skin appearance did not exhibit: Callus, Crepitus, Excoriation, Induration, Rash, Scarring, Dry/Scaly, Maceration, Atrophie Blanche, Cyanosis, Ecchymosis, Hemosiderin Staining, Mottled, Pallor, Rubor, Erythema. Periwound temperature was noted as Hot. Wound #7 status is Open. Original cause of wound was Trauma. The wound is located on the Left,Dorsal Hand - Dorsum. The wound measures 1.2cm length x 1cm width x 0.1cm depth; 0.942cm^2 area and 0.094cm^3 volume. There is no tunneling or undermining noted. There is a large amount of serous drainage noted. The wound margin is flat and intact. There is no granulation within the wound bed. There is a large (67-100%) amount of necrotic tissue within the wound bed including Eschar and Adherent Slough. The periwound skin appearance exhibited: Erythema. The periwound skin appearance did not exhibit: Callus, Crepitus, Excoriation, Induration, Rash, Scarring, Dry/Scaly, Maceration, Atrophie Blanche, Cyanosis, Ecchymosis, Hemosiderin Staining, Mottled, Pallor, Rubor. The surrounding wound skin color is noted with erythema which is circumferential. Periwound temperature was noted as Hot. Wound #8 status is Open. Original cause of wound was Trauma. The wound is located on the Left Hand - Web Between 1st and 2nd Digit. The wound measures 0.1cm length x 0.1cm width x 0.1cm depth; 0.008cm^2 area and 0.001cm^3 volume. There is Fat Layer  (Subcutaneous Tissue) Exposed exposed. There is no tunneling or undermining noted. There is a large amount of serous drainage noted. The wound margin is flat and intact. There is no granulation within the wound bed. There is a large (67-100%) amount of necrotic tissue within the wound bed including Eschar. The periwound skin appearance did not exhibit: Callus, Crepitus, Excoriation, Induration, Rash, Scarring, Dry/Scaly, Maceration, Atrophie Blanche, Cyanosis, Ecchymosis, Hemosiderin Staining, Mottled, Pallor, Rubor, Erythema. Periwound temperature was noted as Hot. Assessment Active Problems ICD-10 S60.571D - Other superficial bite of hand of right hand, subsequent encounter S60.571D - Other superficial bite of hand of right hand, subsequent encounter L03.113 - Cellulitis of right upper limb [name], [name] ([id number]) Plan Wound Cleansing: Wound #1R Right Hand - Web Between 1st and 2nd Digit: Clean wound with Normal Saline. Cleanse wound with mild soap and water Wound #4 Right,Lateral Hand - Dorsum: Clean wound with Normal Saline. Cleanse wound with mild soap and water Wound #5 Right,Medial Wrist: Clean wound with Normal Saline. Cleanse wound with mild soap and water Wound #7 Left,Dorsal Hand - Dorsum: Clean wound with Normal Saline. Cleanse wound with mild soap and water Wound #8 Left Hand - Web Between 1st and 2nd Digit: Clean wound with Normal Saline. Cleanse wound with mild soap and water Anesthetic: Wound #1R Right Hand - Web Between 1st and 2nd Digit: Topical Lidocaine 4% cream applied to wound bed prior to debridement Wound #4 Right,Lateral Hand - Dorsum: Topical Lidocaine 4% cream applied to wound bed prior to debridement Wound #5 Right,Medial Wrist: Topical Lidocaine 4% cream applied to wound bed prior to debridement Wound #7 Left,Dorsal Hand - Dorsum: Topical Lidocaine 4% cream applied to wound bed prior to debridement Wound #8 Left Hand - Web Between 1st and 2nd  Digit: Topical Lidocaine 4% cream applied to wound bed prior to  debridement Primary Wound Dressing: Wound #1R Right Hand - Web Between 1st and 2nd Digit: Other: - Silvercell Ag or equal Wound #4 Right,Lateral Hand - Dorsum: Other: - Silvercell Ag or equal Wound #5 Right,Medial Wrist: Other: - Silvercell Ag or equal Wound #7 Left,Dorsal Hand - Dorsum: Other: - Silvercell Ag or equal Wound #8 Left Hand - Web Between 1st and 2nd Digit: Other: - Silvercell Ag or equal Secondary Dressing: Wound #1R Right Hand - Web Between 1st and 2nd Digit: ABD pad Conform/Kerlix - DO NOT USE COBAN! Wound #4 Right,Lateral Hand - Dorsum: ABD pad Conform/Kerlix - DO NOT USE COBAN! Wound #5 Right,Medial Wrist: ABD pad Conform/Kerlix - DO NOT USE COBAN! Wound #7 Left,Dorsal Hand - Dorsum: ABD pad Conform/Kerlix - DO NOT USE COBAN! Wound #8 Left Hand - Web Between 1st and 2nd Digit: ABD pad Conform/Kerlix - DO NOT USE COBAN! Dressing Change Frequency: Wound #1R Right Hand - Web Between 1st and 2nd Digit: Change dressing every other day. [name], [name] ([id number]) Wound #4 Right,Lateral Hand - Dorsum: Change dressing every other day. Wound #5 Right,Medial Wrist: Change dressing every other day. Wound #7 Left,Dorsal Hand - Dorsum: Change dressing every other day. Wound #8 Left Hand - Web Between 1st and 2nd Digit: Change dressing every other day. Follow-up Appointments: Wound #1R Right Hand - Web Between 1st and 2nd Digit: Return Appointment in 1 week. Wound #4 Right,Lateral Hand - Dorsum: Return Appointment in 1 week. Wound #5 Right,Medial Wrist: Return Appointment in 1 week. Wound #7 Left,Dorsal Hand - Dorsum: Return Appointment in 1 week. Wound #8 Left Hand - Web Between 1st and 2nd Digit: Return Appointment in 1 week. I'm going to recommend that we continue with the silver alginate dressing at this point for the next week. We will see patient for follow-up evaluation in one week to  see were things stand. If anything worsens significantly in the interim patient's family or caregivers will contact us for additional recommendations. Electronic Signature(s) Signed: 11/20/2016 12:07:21 PM By: Lenda Kelp PA-C Entered By: Lenda Kelp on 11/20/2016 12:01:22 [name] ([id number]) -------------------------------------------------------------------------------- ROS/PFSH Details Patient Name: [name]. Date of Service: 11/20/2016 11:00 AM Medical Record Number: [id number] Patient Account Number: [id number] Date of Birth/Sex: 12/09/1965 (51 y.o. Female) Treating RN: Curtis Sites Primary Care Provider: Einar Crow Other Clinician: Referring Provider: Einar Crow Treating Provider/Extender: Linwood Dibbles, HOYT Weeks in Treatment: 9 Unable to Obtain Patient History due to oo Altered Mental Status Information Obtained From Patient Wound History Do you currently have one or more open woundso Yes How many open wounds do you currently haveo 3 Approximately how long have you had your woundso 2 weeks Constitutional Symptoms (General Health) Complaints and Symptoms: Negative for: Fever; Chills Medical History: Past Medical History Notes: Seizure Disorder; CP; mental retardation, quadriplegia; seizures thrombocytopenia Respiratory Complaints and Symptoms: No Complaints or Symptoms Medical History: Positive for: Sleep Apnea Cardiovascular Complaints and Symptoms: No Complaints or Symptoms Neurologic Medical History: Positive for: Quadriplegia Immunizations Pneumococcal Vaccine: Received Pneumococcal Vaccination: No Implantable Devices Family and Social History Never smoker; Marital Status - Single; Alcohol Use: Never; Drug Use: No History; Caffeine Use: Never Physician Affirmation I have reviewed and agree with the above information. [name], [name] ([id number]) Notes Sedondary to mental status patient is unable to provide a  complete review of systems, social history, family history, and past medical history. Electronic Signature(s) Signed: 11/20/2016 12:07:21 PM By: Lenda Kelp PA-C Signed: 11/20/2016 4:29:50 PM By: Curtis Sites  Entered By: Lenda Kelp on 11/20/2016 11:57:39 [name], Carolyn Lade ([id number]) -------------------------------------------------------------------------------- SuperBill Details Patient Name: [name]. Date of Service: 11/20/2016 Medical Record Number: [id number] Patient Account Number: [id number] Date of Birth/Sex: 27-Feb-1965 (51 y.o. Female) Treating RN: Curtis Sites Primary Care Provider: Einar Crow Other Clinician: Referring Provider: Einar Crow Treating Provider/Extender: Linwood Dibbles, HOYT Weeks in Treatment: 9 Diagnosis Coding ICD-10 Codes Code Description S60.571D Other superficial bite of hand of right hand, subsequent encounter S60.571D Other superficial bite of hand of right hand, subsequent encounter L03.113 Cellulitis of right upper limb Facility Procedures CPT4 Code: 78295621 Description: [phone] - WOUND CARE VISIT-LEV 5 EST PT Modifier: Quantity: 1 Physician Procedures CPT4 Code: 7846962 Description: 99213 - WC PHYS LEVEL 3 - EST PT ICD-10 Diagnosis Description S60.571D Other superficial bite of hand of right hand, subsequent e L03.113 Cellulitis of right upper limb Modifier: ncounter Quantity: 1 Electronic Signature(s) Signed: 11/20/2016 4:29:50 PM By: Curtis Sites Signed: 11/21/2016 3:07:11 PM By: Lenda Kelp PA-C Previous Signature: 11/20/2016 12:07:21 PM Version By: Lenda Kelp PA-C Entered By: Curtis Sites on 11/20/2016 15:44:22

## 2016-11-27 ENCOUNTER — Encounter: Payer: Medicare Other | Attending: Internal Medicine | Admitting: Internal Medicine

## 2016-11-27 DIAGNOSIS — L03113 Cellulitis of right upper limb: Secondary | ICD-10-CM | POA: Insufficient documentation

## 2016-11-27 DIAGNOSIS — F72 Severe intellectual disabilities: Secondary | ICD-10-CM | POA: Insufficient documentation

## 2016-11-27 DIAGNOSIS — G40909 Epilepsy, unspecified, not intractable, without status epilepticus: Secondary | ICD-10-CM | POA: Insufficient documentation

## 2016-11-27 DIAGNOSIS — X58XXXD Exposure to other specified factors, subsequent encounter: Secondary | ICD-10-CM | POA: Diagnosis not present

## 2016-11-27 DIAGNOSIS — G809 Cerebral palsy, unspecified: Secondary | ICD-10-CM | POA: Insufficient documentation

## 2016-11-27 DIAGNOSIS — S60571D Other superficial bite of hand of right hand, subsequent encounter: Secondary | ICD-10-CM | POA: Insufficient documentation

## 2016-11-29 NOTE — Progress Notes (Signed)
Carolyn, [name] ([id number]) Visit Report for 11/27/2016 Arrival Information Details Patient Name: [name], [name]. Date of Service: 11/27/2016 12:30 PM Medical Record Number: [id number] Patient Account Number: [id number] Date of Birth/Sex: Jun 08, 1965 (51 y.o. Female) Treating RN: Curtis Sites Primary Care Rodriquez Thorner: Einar Crow Other Clinician: Referring Dempsey Ahonen: Einar Crow Treating Taijah Macrae/Extender: Altamese [place] in Treatment: 10 Visit Information History Since Last Visit Added or deleted any medications: No Patient Arrived: Wheel Chair Any new allergies or adverse reactions: No Arrival Time: 12:40 Had a fall or experienced change in No activities of daily living that may affect Accompanied By: staff risk of falls: Transfer Assistance: None Signs or symptoms of abuse/neglect since No Patient Identification Verified: Yes last visito Secondary Verification Process Completed: Yes Hospitalized since last visit: No Patient Requires Transmission-Based No Has Dressing in Place as Prescribed: Yes Precautions: Pain Present Now: Unable to Patient Has Alerts: Yes Respond Electronic Signature(s) Signed: 11/27/2016 5:08:04 PM By: Curtis Sites Entered By: Curtis Sites on 11/27/2016 12:44:38 [name] ([id number]) -------------------------------------------------------------------------------- Clinic Level of Care Assessment Details Patient Name: [name]. Date of Service: 11/27/2016 12:30 PM Medical Record Number: [id number] Patient Account Number: [id number] Date of Birth/Sex: 1965/05/02 (51 y.o. Female) Treating RN: Curtis Sites Primary Care Raschelle Wisenbaker: Einar Crow Other Clinician: Referring Katrina Daddona: Einar Crow Treating Nicolet Griffy/Extender: Altamese [place] in Treatment: 10 Clinic Level of Care Assessment Items TOOL 4 Quantity Score []  - Use when only an EandM is performed on FOLLOW-UP visit 0 ASSESSMENTS -  Nursing Assessment / Reassessment X - Reassessment of Co-morbidities (includes updates in patient status) 1 10 X- 1 5 Reassessment of Adherence to Treatment Plan ASSESSMENTS - Wound and Skin Assessment / Reassessment []  - Simple Wound Assessment / Reassessment - one wound 0 X- 5 5 Complex Wound Assessment / Reassessment - multiple wounds []  - 0 Dermatologic / Skin Assessment (not related to wound area) ASSESSMENTS - Focused Assessment []  - Circumferential Edema Measurements - multi extremities 0 []  - 0 Nutritional Assessment / Counseling / Intervention []  - 0 Lower Extremity Assessment (monofilament, tuning fork, pulses) []  - 0 Peripheral Arterial Disease Assessment (using hand held doppler) ASSESSMENTS - Ostomy and/or Continence Assessment and Care []  - Incontinence Assessment and Management 0 []  - 0 Ostomy Care Assessment and Management (repouching, etc.) PROCESS - Coordination of Care X - Simple Patient / Family Education for ongoing care 1 15 []  - 0 Complex (extensive) Patient / Family Education for ongoing care []  - 0 Staff obtains Chiropractor, Records, Test Results / Process Orders []  - 0 Staff telephones HHA, Nursing Homes / Clarify orders / etc []  - 0 Routine Transfer to another Facility (non-emergent condition) []  - 0 Routine Hospital Admission (non-emergent condition) []  - 0 New Admissions / Manufacturing engineer / Ordering NPWT, Apligraf, etc. []  - 0 Emergency Hospital Admission (emergent condition) X- 1 10 Simple Discharge Coordination Carolyn, [name]. ([id number]) []  - 0 Complex (extensive) Discharge Coordination PROCESS - Special Needs []  - Pediatric / Minor Patient Management 0 []  - 0 Isolation Patient Management []  - 0 Hearing / Language / Visual special needs []  - 0 Assessment of Community assistance (transportation, D/C planning, etc.) X- 1 15 Additional assistance / Altered mentation []  - 0 Support Surface(s) Assessment (bed, cushion, seat,  etc.) INTERVENTIONS - Wound Cleansing / Measurement []  - Simple Wound Cleansing - one wound 0 X- 5 5 Complex Wound Cleansing - multiple wounds X- 1 5 Wound Imaging (photographs - any number of wounds) []  - 0  Wound Tracing (instead of photographs) []  - 0 Simple Wound Measurement - one wound X- 5 5 Complex Wound Measurement - multiple wounds INTERVENTIONS - Wound Dressings X - Small Wound Dressing one or multiple wounds 4 10 []  - 0 Medium Wound Dressing one or multiple wounds []  - 0 Large Wound Dressing one or multiple wounds []  - 0 Application of Medications - topical []  - 0 Application of Medications - injection INTERVENTIONS - Miscellaneous []  - External ear exam 0 []  - 0 Specimen Collection (cultures, biopsies, blood, body fluids, etc.) []  - 0 Specimen(s) / Culture(s) sent or taken to Lab for analysis []  - 0 Patient Transfer (multiple staff / Nurse, adult / Similar devices) []  - 0 Simple Staple / Suture removal (25 or less) []  - 0 Complex Staple / Suture removal (26 or more) []  - 0 Hypo / Hyperglycemic Management (close monitor of Blood Glucose) []  - 0 Ankle / Brachial Index (ABI) - do not check if billed separately X- 1 5 Vital Signs Carolyn, Scharlene K. ([id number]) Has the patient been seen at the hospital within the last three years: Yes Total Score: 180 Level Of Care: New/Established - Level 5 Electronic Signature(s) Signed: 11/27/2016 5:08:04 PM By: Curtis Sites Entered By: Curtis Sites on 11/27/2016 16:29:03 [name] ([id number]) -------------------------------------------------------------------------------- Encounter Discharge Information Details Patient Name: [name]. Date of Service: 11/27/2016 12:30 PM Medical Record Number: [id number] Patient Account Number: [id number] Date of Birth/Sex: Dec 17, 1965 (51 y.o. Female) Treating RN: Curtis Sites Primary Care Aloys Hupfer: Einar Crow Other Clinician: Referring Aishani Kalis: Einar Crow Treating Skylynne Schlechter/Extender: Altamese [place] in Treatment: 10 Encounter Discharge Information Items Discharge Pain Level: 0 Discharge Condition: Stable Ambulatory Status: Wheelchair Discharge Destination: Home Transportation: Private Auto Accompanied By: staff Schedule Follow-up Appointment: Yes Medication Reconciliation completed and No provided to Patient/Care Addi Pak: Provided on Clinical Summary of Care: 11/27/2016 Form Type Recipient Paper Patient MB Electronic Signature(s) Signed: 11/27/2016 4:30:07 PM By: Curtis Sites Entered By: Curtis Sites on 11/27/2016 16:30:07 [name] ([id number]) -------------------------------------------------------------------------------- Multi Wound Chart Details Patient Name: [name]. Date of Service: 11/27/2016 12:30 PM Medical Record Number: [id number] Patient Account Number: [id number] Date of Birth/Sex: 1965/12/09 (51 y.o. Female) Treating RN: Curtis Sites Primary Care Kerstin Crusoe: Einar Crow Other Clinician: Referring Lexani Corona: Einar Crow Treating Lavana Huckeba/Extender: Altamese [place] in Treatment: 10 Vital Signs Height(in): Pulse(bpm): 88 Weight(lbs): Blood Pressure(mmHg): 125/87 Body Mass Index(BMI): Temperature(F): 98.4 Respiratory Rate 18 (breaths/min): Photos: Wound Location: Right Hand - Web Between 1st Right Hand - Dorsum - Lateral Right Wrist - Medial and 2nd Digit Wounding Event: Trauma Bite Trauma Primary Etiology: Trauma, Other Trauma, Other Trauma, Other Comorbid History: Sleep Apnea, Quadriplegia Sleep Apnea, Quadriplegia Sleep Apnea, Quadriplegia Date Acquired: 08/26/2016 10/23/2016 11/04/2016 Weeks of Treatment: 10 4 3  Wound Status: Open Open Open Wound Recurrence: Yes No No Measurements L x W x D 0.2x0.6x0.1 0.5x0.4x0.1 4.5x1.7x0.1 (cm) Area (cm) : 0.094 0.157 6.008 Volume (cm) : 0.009 0.016 0.601 % Reduction in Area: 92.50% 81.00% 27.10% %  Reduction in Volume: 92.90% 80.50% 27.20% Classification: Partial Thickness Full Thickness Without Full Thickness Without Exposed Support Structures Exposed Support Structures Exudate Amount: Medium Medium Large Exudate Type: Serous Serosanguineous Serous Exudate Color: amber red, brown amber Wound Margin: Flat and Intact Flat and Intact Flat and Intact Granulation Amount: Medium (34-66%) None Present (0%) Small (1-33%) Granulation Quality: Red N/A Red Necrotic Amount: Medium (34-66%) Large (67-100%) Large (67-100%) Necrotic Tissue: Adherent Slough Eschar Eschar, Adherent Slough Exposed Structures: Fat Layer (  Subcutaneous Fascia: No Fat Layer (Subcutaneous Tissue) Exposed: Yes Fat Layer (Subcutaneous Tissue) Exposed: Yes Fascia: No Tissue) Exposed: No Fascia: No Tendon: No Tendon: No Tendon: No Filler, Hayla K. ([id number]) Muscle: No Muscle: No Muscle: No Joint: No Joint: No Joint: No Bone: No Bone: No Bone: No Epithelialization: Medium (34-66%) None None Periwound Skin Texture: Excoriation: No No Abnormalities Noted Excoriation: No Induration: No Induration: No Callus: No Callus: No Crepitus: No Crepitus: No Rash: No Rash: No Scarring: No Scarring: No Periwound Skin Moisture: Maceration: No Dry/Scaly: Yes Maceration: Yes Dry/Scaly: No Dry/Scaly: No Periwound Skin Color: Atrophie Blanche: No Erythema: Yes Erythema: Yes Cyanosis: No Atrophie Blanche: No Ecchymosis: No Cyanosis: No Erythema: No Ecchymosis: No Hemosiderin Staining: No Hemosiderin Staining: No Mottled: No Mottled: No Pallor: No Pallor: No Rubor: No Rubor: No Erythema Location: N/A N/A Circumferential Temperature: Hot Hot Hot Tenderness on Palpation: No No No Wound Preparation: Ulcer Cleansing: Ulcer Cleansing: Ulcer Cleansing: Rinsed/Irrigated with Saline Rinsed/Irrigated with Saline Rinsed/Irrigated with Saline Topical Anesthetic Applied: Topical Anesthetic Applied: Topical  Anesthetic Applied: None, Other: Lidocaine 4% None, Other: Lidocaine4% None, Other: Lidocaine4% Wound Number: 7 8 N/A Photos: N/A Wound Location: Left Hand - Dorsum - Dorsal Left Hand - Web Between 1st N/A and 2nd Digit Wounding Event: Trauma Trauma N/A Primary Etiology: Trauma, Other Trauma, Other N/A Comorbid History: Sleep Apnea, Quadriplegia N/A N/A Date Acquired: 11/05/2016 11/05/2016 N/A Weeks of Treatment: 3 3 N/A Wound Status: Open Healed - Epithelialized N/A Wound Recurrence: No No N/A Measurements L x W x D 1.3x1.4x0.1 0x0x0 N/A (cm) Area (cm) : 1.429 0 N/A Volume (cm) : 0.143 0 N/A % Reduction in Area: 80.30% 100.00% N/A % Reduction in Volume: 80.30% 100.00% N/A Classification: Full Thickness Without Full Thickness With Exposed N/A Exposed Support Structures Support Structures Exudate Amount: Large N/A N/A Exudate Type: Serous N/A N/A [name], [name]. ([id number]) Exudate Color: amber N/A N/A Wound Margin: Flat and Intact N/A N/A Granulation Amount: None Present (0%) N/A N/A Granulation Quality: N/A N/A N/A Necrotic Amount: Large (67-100%) N/A N/A Necrotic Tissue: Eschar, Adherent Slough N/A N/A Exposed Structures: Fascia: No N/A N/A Fat Layer (Subcutaneous Tissue) Exposed: No Tendon: No Muscle: No Joint: No Bone: No Epithelialization: None N/A N/A Periwound Skin Texture: Excoriation: No No Abnormalities Noted N/A Induration: No Callus: No Crepitus: No Rash: No Scarring: No Periwound Skin Moisture: Maceration: No No Abnormalities Noted N/A Dry/Scaly: No Periwound Skin Color: Erythema: Yes No Abnormalities Noted N/A Atrophie Blanche: No Cyanosis: No Ecchymosis: No Hemosiderin Staining: No Mottled: No Pallor: No Rubor: No Erythema Location: Circumferential N/A N/A Temperature: Hot N/A N/A Tenderness on Palpation: No No N/A Wound Preparation: Ulcer Cleansing: N/A N/A Rinsed/Irrigated with Saline Topical Anesthetic Applied: None, Other:  Lidocaine4% Treatment Notes Wound #1R (Right Hand - Web Between 1st and 2nd Digit) 1. Cleansed with: Clean wound with Normal Saline 4. Dressing Applied: Aquacel Ag Notes Conform secured with tape applied to hands. Wound #4 (Right, Lateral Hand - Dorsum) 1. Cleansed with: Clean wound with Normal Saline 4. Dressing Applied: Aquacel Ag Notes Conform secured with tape applied to hands. [name], [name] ([id number]) Wound #5 (Right, Medial Wrist) 1. Cleansed with: Clean wound with Normal Saline 4. Dressing Applied: Aquacel Ag Notes Conform secured with tape applied to hands. Wound #7 (Left, Dorsal Hand - Dorsum) 1. Cleansed with: Clean wound with Normal Saline 4. Dressing Applied: Aquacel Ag Notes Conform secured with tape applied to hands. Electronic Signature(s) Signed: 11/27/2016 5:39:07 PM By: Baltazar Najjar MD Entered  By: Baltazar Najjarobson, Michael on 11/27/2016 16:33:12 [name], [name] ([id number]) -------------------------------------------------------------------------------- Multi-Disciplinary Care Plan Details Patient Name: [name], [name] Date of Service: 11/27/2016 12:30 PM Medical Record Number: [id number] Patient Account Number: [id number] Date of Birth/Sex: October 23, 1965 (51 y.o. Female) Treating RN: Curtis Sitesorthy, Joanna Primary Care Adalin Vanderploeg: Einar CrowAnderson, Marshall Other Clinician: Referring Macon Lesesne: Einar CrowAnderson, Marshall Treating Pia Jedlicka/Extender: Altamese CarolinaOBSON, MICHAEL G Weeks in Treatment: 10 Active Inactive ` Soft Tissue Infection Nursing Diagnoses: Impaired tissue integrity Potential for infection: soft tissue Goals: Patient will remain free of wound infection Date Initiated: 10/30/2016 Target Resolution Date: 11/19/2016 Goal Status: Active Interventions: Assess signs and symptoms of infection every visit Notes: ` Wound/Skin Impairment Nursing Diagnoses: Impaired tissue integrity Goals: Ulcer/skin breakdown will have a volume reduction of 30% by week 4 Date  Initiated: 10/30/2016 Target Resolution Date: 11/30/2016 Goal Status: Active Interventions: Assess ulceration(s) every visit Treatment Activities: Topical wound management initiated : 10/30/2016 Notes: Electronic Signature(s) Signed: 11/27/2016 5:08:04 PM By: Curtis Sitesorthy, Joanna Entered By: Curtis Sitesorthy, Joanna on 11/27/2016 13:17:58 [name], Carolyn LadeMARGARET K. ([id number]) -------------------------------------------------------------------------------- Pain Assessment Details Patient Name: [name], [name] Date of Service: 11/27/2016 12:30 PM Medical Record Number: [id number] Patient Account Number: [id number] Date of Birth/Sex: October 23, 1965 (51 y.o. Female) Treating RN: Curtis Sitesorthy, Joanna Primary Care Humphrey Guerreiro: Einar CrowAnderson, Marshall Other Clinician: Referring Jaion Lagrange: Einar CrowAnderson, Marshall Treating Ardie Mclennan/Extender: Altamese CarolinaOBSON, MICHAEL G Weeks in Treatment: 10 Active Problems Location of Pain Severity and Description of Pain Patient Has Paino Patient Unable to Respond Site Locations Pain Management and Medication Current Pain Management: Electronic Signature(s) Signed: 11/27/2016 5:08:04 PM By: Curtis Sitesorthy, Joanna Entered By: Curtis Sitesorthy, Joanna on 11/27/2016 12:44:50 [name], Mayah K. ([id number]) -------------------------------------------------------------------------------- Patient/Caregiver Education Details Patient Name: [name], [name] Date of Service: 11/27/2016 12:30 PM Medical Record Number: [id number] Patient Account Number: [id number] Date of Birth/Gender: October 23, 1965 (51 y.o. Female) Treating RN: Curtis Sitesorthy, Joanna Primary Care Physician: Einar CrowAnderson, Marshall Other Clinician: Referring Physician: Einar CrowAnderson, Marshall Treating Physician/Extender: Altamese CarolinaOBSON, MICHAEL G Weeks in Treatment: 10 Education Assessment Education Provided To: Caregiver Education Topics Provided Wound/Skin Impairment: Handouts: Other: wound care as ordered Methods: Demonstration, Explain/Verbal, Printed Responses: State content  correctly Electronic Signature(s) Signed: 11/27/2016 5:08:04 PM By: Curtis Sitesorthy, Joanna Entered By: Curtis Sitesorthy, Joanna on 11/27/2016 16:30:46 [name], [name] ([id number]) -------------------------------------------------------------------------------- Wound Assessment Details Patient Name: [name], Chrisann K. Date of Service: 11/27/2016 12:30 PM Medical Record Number: [id number] Patient Account Number: [id number] Date of Birth/Sex: October 23, 1965 (51 y.o. Female) Treating RN: Curtis Sitesorthy, Joanna Primary Care Pericles Carmicheal: Einar CrowAnderson, Marshall Other Clinician: Referring Nicoletta Hush: Einar CrowAnderson, Marshall Treating Allyiah Gartner/Extender: Altamese CarolinaOBSON, MICHAEL G Weeks in Treatment: 10 Wound Status Wound Number: 1R Primary Etiology: Trauma, Other Wound Location: Right Hand - Web Between 1st and 2nd Wound Status: Open Digit Comorbid History: Sleep Apnea, Quadriplegia Wounding Event: Trauma Date Acquired: 08/26/2016 Weeks Of Treatment: 10 Clustered Wound: No Photos Photo Uploaded By: Curtis Sitesorthy, Joanna on 11/27/2016 13:01:24 Wound Measurements Length: (cm) 0.2 Width: (cm) 0.6 Depth: (cm) 0.1 Area: (cm) 0.094 Volume: (cm) 0.009 % Reduction in Area: 92.5% % Reduction in Volume: 92.9% Epithelialization: Medium (34-66%) Tunneling: No Undermining: No Wound Description Classification: Partial Thickness Wound Margin: Flat and Intact Exudate Amount: Medium Exudate Type: Serous Exudate Color: amber Foul Odor After Cleansing: No Slough/Fibrino No Wound Bed Granulation Amount: Medium (34-66%) Exposed Structure Granulation Quality: Red Fascia Exposed: No Necrotic Amount: Medium (34-66%) Fat Layer (Subcutaneous Tissue) Exposed: Yes Necrotic Quality: Adherent Slough Tendon Exposed: No Muscle Exposed: No Joint Exposed: No Bone Exposed: No Periwound Skin Texture Brockmann, Vandora K. ([id number]) Texture Color No Abnormalities Noted: No No Abnormalities Noted: No Callus: No Atrophie Blanche:  No Crepitus: No Cyanosis:  No Excoriation: No Ecchymosis: No Induration: No Erythema: No Rash: No Hemosiderin Staining: No Scarring: No Mottled: No Pallor: No Moisture Rubor: No No Abnormalities Noted: No Dry / Scaly: No Temperature / Pain Maceration: No Temperature: Hot Wound Preparation Ulcer Cleansing: Rinsed/Irrigated with Saline Topical Anesthetic Applied: None, Other: Lidocaine 4%, Treatment Notes Wound #1R (Right Hand - Web Between 1st and 2nd Digit) 1. Cleansed with: Clean wound with Normal Saline 4. Dressing Applied: Aquacel Ag Notes Conform secured with tape applied to hands. Electronic Signature(s) Signed: 11/27/2016 5:08:04 PM By: Curtis Sites Entered By: Curtis Sites on 11/27/2016 12:58:38 [name] ([id number]) -------------------------------------------------------------------------------- Wound Assessment Details Patient Name: KAMEA, DACOSTA. Date of Service: 11/27/2016 12:30 PM Medical Record Number: [id number] Patient Account Number: [id number] Date of Birth/Sex: 10/19/1965 (51 y.o. Female) Treating RN: Curtis Sites Primary Care Rayyan Orsborn: Einar Crow Other Clinician: Referring Anais Denslow: Einar Crow Treating Samiyyah Moffa/Extender: Altamese [place] in Treatment: 10 Wound Status Wound Number: 4 Primary Etiology: Trauma, Other Wound Location: Right Hand - Dorsum - Lateral Wound Status: Open Wounding Event: Bite Comorbid History: Sleep Apnea, Quadriplegia Date Acquired: 10/23/2016 Weeks Of Treatment: 4 Clustered Wound: No Photos Photo Uploaded By: Curtis Sites on 11/27/2016 13:01:27 Wound Measurements Length: (cm) 0.5 Width: (cm) 0.4 Depth: (cm) 0.1 Area: (cm) 0.157 Volume: (cm) 0.016 % Reduction in Area: 81% % Reduction in Volume: 80.5% Epithelialization: None Tunneling: No Undermining: No Wound Description Full Thickness Without Exposed Support Classification: Structures Wound Margin: Flat and  Intact Exudate Medium Amount: Exudate Type: Serosanguineous Exudate Color: red, brown Foul Odor After Cleansing: No Slough/Fibrino No Wound Bed Granulation Amount: None Present (0%) Exposed Structure Necrotic Amount: Large (67-100%) Fascia Exposed: No Necrotic Quality: Eschar Fat Layer (Subcutaneous Tissue) Exposed: No Tendon Exposed: No Muscle Exposed: No Joint Exposed: No Bone Exposed: No Figuero, Merit K. ([id number]) Periwound Skin Texture Texture Color No Abnormalities Noted: No No Abnormalities Noted: No Erythema: Yes Moisture No Abnormalities Noted: No Temperature / Pain Dry / Scaly: Yes Temperature: Hot Wound Preparation Ulcer Cleansing: Rinsed/Irrigated with Saline Topical Anesthetic Applied: None, Other: Lidocaine4%, Treatment Notes Wound #4 (Right, Lateral Hand - Dorsum) 1. Cleansed with: Clean wound with Normal Saline 4. Dressing Applied: Aquacel Ag Notes Conform secured with tape applied to hands. Electronic Signature(s) Signed: 11/27/2016 5:08:04 PM By: Curtis Sites Entered By: Curtis Sites on 11/27/2016 12:58:55 [name] ([id number]) -------------------------------------------------------------------------------- Wound Assessment Details Patient Name: CORDELLA, NYQUIST. Date of Service: 11/27/2016 12:30 PM Medical Record Number: [id number] Patient Account Number: [id number] Date of Birth/Sex: 1965-03-08 (51 y.o. Female) Treating RN: Curtis Sites Primary Care Manjot Beumer: Einar Crow Other Clinician: Referring Jasn Xia: Einar Crow Treating Laurynn Mccorvey/Extender: Altamese [place] in Treatment: 10 Wound Status Wound Number: 5 Primary Etiology: Trauma, Other Wound Location: Right Wrist - Medial Wound Status: Open Wounding Event: Trauma Comorbid History: Sleep Apnea, Quadriplegia Date Acquired: 11/04/2016 Weeks Of Treatment: 3 Clustered Wound: No Photos Photo Uploaded By: Curtis Sites on 11/27/2016 13:02:17 Wound  Measurements Length: (cm) 4.5 Width: (cm) 1.7 Depth: (cm) 0.1 Area: (cm) 6.008 Volume: (cm) 0.601 % Reduction in Area: 27.1% % Reduction in Volume: 27.2% Epithelialization: None Tunneling: No Undermining: No Wound Description Full Thickness Without Exposed Support Classification: Structures Wound Margin: Flat and Intact Exudate Large Amount: Exudate Type: Serous Exudate Color: amber Foul Odor After Cleansing: No Slough/Fibrino Yes Wound Bed Granulation Amount: Small (1-33%) Exposed Structure Granulation Quality: Red Fascia Exposed: No Necrotic Amount: Large (67-100%) Fat Layer (Subcutaneous Tissue) Exposed: Yes Necrotic Quality: Eschar,  Adherent Slough Tendon Exposed: No Muscle Exposed: No Joint Exposed: No Bone Exposed: No Kotlyar, Aliha K. ([id number]) Periwound Skin Texture Texture Color No Abnormalities Noted: No No Abnormalities Noted: No Callus: No Atrophie Blanche: No Crepitus: No Cyanosis: No Excoriation: No Ecchymosis: No Induration: No Erythema: Yes Rash: No Erythema Location: Circumferential Scarring: No Hemosiderin Staining: No Mottled: No Moisture Pallor: No No Abnormalities Noted: No Rubor: No Dry / Scaly: No Maceration: Yes Temperature / Pain Temperature: Hot Wound Preparation Ulcer Cleansing: Rinsed/Irrigated with Saline Topical Anesthetic Applied: None, Other: Lidocaine4%, Treatment Notes Wound #5 (Right, Medial Wrist) 1. Cleansed with: Clean wound with Normal Saline 4. Dressing Applied: Aquacel Ag Notes Conform secured with tape applied to hands. Electronic Signature(s) Signed: 11/27/2016 5:08:04 PM By: Curtis Sites Entered By: Curtis Sites on 11/27/2016 12:59:18 [name] ([id number]) -------------------------------------------------------------------------------- Wound Assessment Details Patient Name: BRONWYN, BELASCO. Date of Service: 11/27/2016 12:30 PM Medical Record Number: [id number] Patient Account  Number: [id number] Date of Birth/Sex: 1965-11-25 (51 y.o. Female) Treating RN: Curtis Sites Primary Care Emmett Bracknell: Einar Crow Other Clinician: Referring Teddie Mehta: Einar Crow Treating Aliyyah Riese/Extender: Altamese [place] in Treatment: 10 Wound Status Wound Number: 7 Primary Etiology: Trauma, Other Wound Location: Left Hand - Dorsum - Dorsal Wound Status: Open Wounding Event: Trauma Comorbid History: Sleep Apnea, Quadriplegia Date Acquired: 11/05/2016 Weeks Of Treatment: 3 Clustered Wound: No Photos Wound Measurements Length: (cm) 1.3 Width: (cm) 1.4 Depth: (cm) 0.1 Area: (cm) 1.429 Volume: (cm) 0.143 % Reduction in Area: 80.3% % Reduction in Volume: 80.3% Epithelialization: None Tunneling: No Undermining: No Wound Description Full Thickness Without Exposed Support Foul Odo Classification: Structures Slough/F Wound Margin: Flat and Intact Exudate Large Amount: Exudate Type: Serous Exudate Color: amber r After Cleansing: No ibrino Yes Wound Bed Granulation Amount: None Present (0%) Exposed Structure Necrotic Amount: Large (67-100%) Fascia Exposed: No Necrotic Quality: Eschar, Adherent Slough Fat Layer (Subcutaneous Tissue) Exposed: No Tendon Exposed: No Muscle Exposed: No Joint Exposed: No Bone Exposed: No Periwound Skin Texture Puchalski, Keymoni K. ([id number]) Texture Color No Abnormalities Noted: No No Abnormalities Noted: No Callus: No Atrophie Blanche: No Crepitus: No Cyanosis: No Excoriation: No Ecchymosis: No Induration: No Erythema: Yes Rash: No Erythema Location: Circumferential Scarring: No Hemosiderin Staining: No Mottled: No Moisture Pallor: No No Abnormalities Noted: No Rubor: No Dry / Scaly: No Maceration: No Temperature / Pain Temperature: Hot Wound Preparation Ulcer Cleansing: Rinsed/Irrigated with Saline Topical Anesthetic Applied: None, Other: Lidocaine4%, Treatment Notes Wound #7 (Left, Dorsal Hand  - Dorsum) 1. Cleansed with: Clean wound with Normal Saline 4. Dressing Applied: Aquacel Ag Notes Conform secured with tape applied to hands. Electronic Signature(s) Signed: 11/27/2016 5:08:04 PM By: Curtis Sites Entered By: Curtis Sites on 11/27/2016 13:17:09 [name] ([id number]) -------------------------------------------------------------------------------- Wound Assessment Details Patient Name: [name]. Date of Service: 11/27/2016 12:30 PM Medical Record Number: [id number] Patient Account Number: [id number] Date of Birth/Sex: [birth year] (51 y.o. Female) Treating RN: Curtis Sites Primary Care Jaymond Waage: Einar Crow Other Clinician: Referring Dillyn Menna: Einar Crow Treating Avanthika Dehnert/Extender: Altamese [place] in Treatment: 10 Wound Status Wound Number: 8 Primary Etiology: Trauma, Other Wound Location: Left Hand - Web Between 1st and 2nd Wound Status: Healed - Epithelialized Digit Wounding Event: Trauma Date Acquired: 11/05/2016 Weeks Of Treatment: 3 Clustered Wound: No Photos Photo Uploaded By: Curtis Sites on 11/27/2016 13:02:45 Wound Measurements Length: (cm) 0 Width: (cm) 0 Depth: (cm) 0 Area: (cm) 0 Volume: (cm) 0 % Reduction in Area: 100% % Reduction in Volume: 100% Wound Description Full  Thickness With Exposed Support Classification: Structures Periwound Skin Texture Texture Color No Abnormalities Noted: No No Abnormalities Noted: No Moisture No Abnormalities Noted: No Electronic Signature(s) Signed: 11/27/2016 5:08:04 PM By: Curtis Sitesorthy, Joanna Entered By: Curtis Sitesorthy, Joanna on 11/27/2016 12:57:13 Wahlberg, Carolyn LadeMARGARET K. ([id number]) -------------------------------------------------------------------------------- Vitals Details Patient Name: [name], Verdine K. Date of Service: 11/27/2016 12:30 PM Medical Record Number: [id number] Patient Account Number: [id number] Date of Birth/Sex: [birth year] (51 y.o. Female) Treating  RN: Curtis Sitesorthy, Joanna Primary Care Viviano Bir: Einar CrowAnderson, Marshall Other Clinician: Referring Merri Dimaano: Einar CrowAnderson, Marshall Treating Shaan Rhoads/Extender: Altamese CarolinaOBSON, MICHAEL G Weeks in Treatment: 10 Vital Signs Time Taken: 12:45 Temperature (F): 98.4 Pulse (bpm): 88 Respiratory Rate (breaths/min): 18 Blood Pressure (mmHg): 125/87 Reference Range: 80 - 120 mg / dl Electronic Signature(s) Signed: 11/27/2016 5:08:04 PM By: Curtis Sitesorthy, Joanna Entered By: Curtis Sitesorthy, Joanna on 11/27/2016 12:45:12

## 2016-11-29 NOTE — Progress Notes (Signed)
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 hand, subsequent e L03.113 Cellulitis of right upper limb Modifier: ncounter Quantity: 1 Electronic Signature(s) Signed: 11/27/2016 5:39:07 PM By: Baltazar Najjar MD Previous Signature: 11/27/2016 4:29:16 PM Version By: Curtis Sites Entered By: Baltazar Najjar on 11/27/2016 16:38:41

## 2016-12-11 ENCOUNTER — Encounter: Payer: Medicare Other | Admitting: Physician Assistant

## 2016-12-11 DIAGNOSIS — S60571D Other superficial bite of hand of right hand, subsequent encounter: Secondary | ICD-10-CM | POA: Diagnosis not present
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
## 2016-12-26 NOTE — Progress Notes (Signed)
Carolyn, [name] ([id number]) Visit Report for 12/11/2016 Arrival Information Details Patient Name: [name], [name]. Date of Service: 12/11/2016 12:30 PM Medical Record Number: [id number] Patient Account Number: [id number] Date of Birth/Sex: 08-14-1965 (51 y.o. Female) Treating RN: [name] Primary Care [name]: [name] Other Clinician: Referring [name]: [name] Treating [name]/Extender: [name], [name] in Treatment: 12 Visit Information History Since Last Visit All ordered tests and consults were completed: No Patient Arrived: Wheel Chair Added or deleted any medications: No Arrival Time: 12:43 Any new allergies or adverse reactions: No Accompanied By: caregiver Had a fall or experienced change in No activities of daily living that may affect Transfer Assistance: None risk of falls: Patient Identification Verified: Yes Signs or symptoms of abuse/neglect since last visito No Secondary Verification Process Completed: Yes Hospitalized since last visit: No Patient Requires Transmission-Based No Pain Present Now: No Precautions: Patient Has Alerts: Yes Electronic Signature(s) Signed: 12/11/2016 3:20:43 PM By: [name] Entered By: [name] on 12/11/2016 12:43:56 [name], [name] ([id number]) -------------------------------------------------------------------------------- Clinic Level of Care Assessment Details Patient Name: [name]. Date of Service: 12/11/2016 12:30 PM Medical Record Number: [id number] Patient Account Number: [id number] Date of Birth/Sex: [birth year] (51 y.o. Female) Treating RN: [name] Primary Care [name]: [name] Other Clinician: Referring [name]: [name] Treating [name]/Extender: [name], [name] in Treatment: 12 Clinic Level of Care Assessment Items TOOL 4 Quantity Score []  - Use when only an EandM is performed on FOLLOW-UP visit 0 ASSESSMENTS -  Nursing Assessment / Reassessment []  - Reassessment of Co-morbidities (includes updates in patient status) 0 X- 1 5 Reassessment of Adherence to Treatment Plan ASSESSMENTS - Wound and Skin Assessment / Reassessment X - Simple Wound Assessment / Reassessment - one wound 1 5 []  - 0 Complex Wound Assessment / Reassessment - multiple wounds []  - 0 Dermatologic / Skin Assessment (not related to wound area) ASSESSMENTS - Focused Assessment []  - Circumferential Edema Measurements - multi extremities 0 []  - 0 Nutritional Assessment / Counseling / Intervention []  - 0 Lower Extremity Assessment (monofilament, tuning fork, pulses) []  - 0 Peripheral Arterial Disease Assessment (using hand held doppler) ASSESSMENTS - Ostomy and/or Continence Assessment and Care []  - Incontinence Assessment and Management 0 []  - 0 Ostomy Care Assessment and Management (repouching, etc.) PROCESS - Coordination of Care X - Simple Patient / Family Education for ongoing care 1 15 []  - 0 Complex (extensive) Patient / Family Education for ongoing care []  - 0 Staff obtains [name] / Process Orders []  - 0 Staff telephones HHA, Nursing Homes / Clarify orders / etc []  - 0 Routine Transfer to another Facility (non-emergent condition) []  - 0 Routine Hospital Admission (non-emergent condition) []  - 0 New Admissions / Manufacturing engineer / Ordering NPWT, Apligraf, etc. []  - 0 Emergency Hospital Admission (emergent condition) X- 1 10 Simple Discharge Coordination [name], [name]. ([id number]) []  - 0 Complex (extensive) Discharge Coordination PROCESS - Special Needs []  - Pediatric / Minor Patient Management 0 []  - 0 Isolation Patient Management []  - 0 Hearing / Language / Visual special needs []  - 0 Assessment of Community assistance (transportation, D/C planning, etc.) []  - 0 Additional assistance / Altered mentation []  - 0 Support Surface(s) Assessment (bed, cushion, seat,  etc.) INTERVENTIONS - Wound Cleansing / Measurement X - Simple Wound Cleansing - one wound 1 5 []  - 0 Complex Wound Cleansing - multiple wounds X- 1 5 Wound Imaging (photographs - any number of wounds) []  - 0  Wound Tracing (instead of photographs) []  - 0 Simple Wound Measurement - one wound []  - 0 Complex Wound Measurement - multiple wounds INTERVENTIONS - Wound Dressings []  - Small Wound Dressing one or multiple wounds 0 []  - 0 Medium Wound Dressing one or multiple wounds []  - 0 Large Wound Dressing one or multiple wounds []  - 0 Application of Medications - topical []  - 0 Application of Medications - injection INTERVENTIONS - Miscellaneous []  - External ear exam 0 []  - 0 Specimen Collection (cultures, biopsies, blood, body fluids, etc.) []  - 0 Specimen(s) / Culture(s) sent or taken to Lab for analysis []  - 0 Patient Transfer (multiple staff / Nurse, adult / Similar devices) []  - 0 Simple Staple / Suture removal (25 or less) []  - 0 Complex Staple / Suture removal (26 or more) []  - 0 Hypo / Hyperglycemic Management (close monitor of Blood Glucose) []  - 0 Ankle / Brachial Index (ABI) - do not check if billed separately X- 1 5 Vital Signs [name], [name] ([id number]) Has the patient been seen at the hospital within the last three years: Yes Total Score: 50 Level Of Care: New/Established - Level 2 Electronic Signature(s) Signed: 12/11/2016 3:20:43 PM By: [name] Entered By: [name] on 12/11/2016 13:18:33 [name], [name] ([id number]) -------------------------------------------------------------------------------- Encounter Discharge Information Details Patient Name: [name]. Date of Service: 12/11/2016 12:30 PM Medical Record Number: [id number] Patient Account Number: [id number] Date of Birth/Sex: [birth year] (51 y.o. Female) Treating RN: [name] Primary Care Darran Gabay: [name] Other Clinician: Referring Elman Dettman:  [name] Treating Loeta Herst/Extender: [name], [name] in Treatment: 12 Encounter Discharge Information Items Discharge Pain Level: 0 Discharge Condition: Stable Ambulatory Status: Wheelchair Discharge Destination: Home Transportation: Private Auto Accompanied By: driver Schedule Follow-up Appointment: No Medication Reconciliation completed and No provided to Patient/Care Trayce Caravello: Provided on Clinical Summary of Care: 12/11/2016 Form Type Recipient Paper Patient MB Electronic Signature(s) Signed: 12/25/2016 9:57:26 AM By: Gwenlyn Perking Entered By: Gwenlyn Perking on 12/11/2016 13:13:26 [name] ([id number]) -------------------------------------------------------------------------------- Lower Extremity Assessment Details Patient Name: [name]. Date of Service: 12/11/2016 12:30 PM Medical Record Number: [id number] Patient Account Number: [id number] Date of Birth/Sex: [birth year] (51 y.o. Female) Treating RN: [name] Primary Care Amaani Guilbault: [name] Other Clinician: Referring Marven Veley: [name] Treating Ataya Murdy/Extender: [name], [name] in Treatment: 12 Electronic Signature(s) Signed: 12/11/2016 3:20:43 PM By: [name] Entered By: [name] on 12/11/2016 13:09:49 [name] ([id number]) -------------------------------------------------------------------------------- Multi Wound Chart Details Patient Name: [name]. Date of Service: 12/11/2016 12:30 PM Medical Record Number: [id number] Patient Account Number: [id number] Date of Birth/Sex: 03/01/1965 (51 y.o. Female) Treating RN: [name] Primary Care Jacilyn Sanpedro: [name] Other Clinician: Referring Eiza Canniff: [name] Treating Efrata Brunner/Extender: [name], [name] in Treatment: 12 Vital Signs Height(in): Pulse(bpm): 88 Weight(lbs): Blood Pressure(mmHg): Body Mass Index(BMI): Temperature(F):  98.4 Respiratory Rate 18 (breaths/min): Photos: [1R:No Photos] [4:No Photos] [5:No Photos] Wound Location: [1R:Right Hand - Web Between 1st Right, Lateral Hand - Dorsum and 2nd Digit] [5:Right, Medial Wrist] Wounding Event: [1R:Trauma] [4:Bite] [5:Trauma] Primary Etiology: [1R:Trauma, Other] [4:Trauma, Other] [5:Trauma, Other] Date Acquired: [1R:08/26/2016] [4:10/23/2016] [5:11/04/2016] [name] of Treatment: [1R:12] [4:6] [5:5] Wound Status: [1R:Healed - Epithelialized] [4:Healed - Epithelialized] [5:Healed - Epithelialized] Wound Recurrence: [1R:Yes] [4:No] [5:No] Measurements L x W x D [1R:0x0x0] [4:0x0x0] [5:0x0x0] (cm) Area (cm) : [1R:0] [4:0] [5:0] Volume (cm) : [1R:0] [4:0] [5:0] % Reduction in Area: [1R:100.00%] [4:100.00%] [5:100.00%] % Reduction in Volume: [1R:100.00%] [4:100.00%] [5:100.00%] Classification: [1R:Partial Thickness] [4:Full  Thickness Without Exposed Support Structures] [5:Full Thickness Without Exposed Support Structures] Periwound Skin Texture: [1R:No Abnormalities Noted] [4:No Abnormalities Noted] [5:No Abnormalities Noted] Periwound Skin Moisture: [1R:No Abnormalities Noted] [4:No Abnormalities Noted] [5:No Abnormalities Noted] Periwound Skin Color: [1R:No Abnormalities Noted No] [4:No Abnormalities Noted No] [5:No Abnormalities Noted No] Wound Number: 7 N/A N/A Photos: No Photos N/A N/A Wound Location: Left, Dorsal Hand - Dorsum N/A N/A Wounding Event: Trauma N/A N/A Primary Etiology: Trauma, Other N/A N/A Date Acquired: 11/05/2016 N/A N/A [name] of Treatment: 5 N/A N/A Wound Status: Healed - Epithelialized N/A N/A Wound Recurrence: No N/A N/A Measurements L x W x D 0x0x0 N/A N/A (cm) Area (cm) : 0 N/A N/A Volume (cm) : 0 N/A N/A % Reduction in Area: 100.00% N/A N/A [name], [name] ([id number]) % Reduction in Volume: 100.00% N/A N/A Classification: Full Thickness Without N/A N/A Exposed Support Structures Periwound Skin Texture: No Abnormalities  Noted N/A N/A Periwound Skin Moisture: No Abnormalities Noted N/A N/A Periwound Skin Color: No Abnormalities Noted N/A N/A Tenderness on Palpation: No N/A N/A Treatment Notes Wound #1R (Right Hand - Web Between 1st and 2nd Digit) 1. Cleansed with: Clean wound with Normal Saline Notes no dressing wounds healed Wound #2 (Left Hand - Web Between 1st and 2nd Digit) 1. Cleansed with: Clean wound with Normal Saline Notes no dressing wounds healed Wound #3 (Left Hand - Dorsum) 1. Cleansed with: Clean wound with Normal Saline Notes no dressing wounds healed Wound #4 (Right, Lateral Hand - Dorsum) 1. Cleansed with: Clean wound with Normal Saline Notes no dressing wounds healed Wound #5 (Right, Medial Wrist) 1. Cleansed with: Clean wound with Normal Saline Notes no dressing wounds healed Wound #6 (Right Hand - Web Between 4th and 5th Digit) 1. Cleansed with: Clean wound with Normal Saline Notes no dressing wounds healed Wound #7 (Left, Dorsal Hand - Dorsum) 1. Cleansed with: Clean wound with Normal Saline Notes no dressing wounds healed [name], [name] ([id number]) Wound #8 (Left Hand - Web Between 1st and 2nd Digit) 1. Cleansed with: Clean wound with Normal Saline Notes no dressing wounds healed Electronic Signature(s) Signed: 12/11/2016 3:20:43 PM By: Carolyn CriglerFlinchum, Cheryl Entered By: Carolyn CriglerFlinchum, Cheryl on 12/11/2016 13:19:00 [name], [name] ([id number]) -------------------------------------------------------------------------------- Multi-Disciplinary Care Plan Details Patient Name: [name], Zeynab K. Date of Service: 12/11/2016 12:30 PM Medical Record Number: [id number] Patient Account Number: [id number] Date of Birth/Sex: 02-11-1965 (51 y.o. Female) Treating RN: Carolyn CriglerFlinchum, Cheryl Primary Care Joby Richart: Carolyn CrowAnderson, Marshall Other Clinician: Referring Logen Heintzelman: Carolyn CrowAnderson, Marshall Treating Kenon Delashmit/Extender: Carolyn DibblesSTONE III, [name] in Treatment: 12 Active  Inactive Electronic Signature(s) Signed: 12/11/2016 1:34:58 PM By: Carolyn CriglerFlinchum, Cheryl Entered By: Carolyn CriglerFlinchum, Cheryl on 12/11/2016 13:34:58 [name], Carolyn LadeMARGARET K. ([id number]) -------------------------------------------------------------------------------- Pain Assessment Details Patient Name: [name], Jaquelyn K. Date of Service: 12/11/2016 12:30 PM Medical Record Number: [id number] Patient Account Number: [id number] Date of Birth/Sex: 02-11-1965 (51 y.o. Female) Treating RN: Carolyn CriglerFlinchum, Cheryl Primary Care Jeyren Danowski: Carolyn CrowAnderson, Marshall Other Clinician: Referring Chanequa Spees: Carolyn CrowAnderson, Marshall Treating Ileene Allie/Extender: Carolyn DibblesSTONE III, [name] in Treatment: 12 Active Problems Location of Pain Severity and Description of Pain Patient Has Paino No Site Locations Pain Management and Medication Current Pain Management: Electronic Signature(s) Signed: 12/11/2016 3:20:43 PM By: Carolyn CriglerFlinchum, Cheryl Entered By: Carolyn CriglerFlinchum, Cheryl on 12/11/2016 12:44:27 [name], [name] ([id number]) -------------------------------------------------------------------------------- Patient/Caregiver Education Details Patient Name: [name], Maia K. Date of Service: 12/11/2016 12:30 PM Medical Record Number: [id number] Patient Account Number: [id number] Date of Birth/Gender: 02-11-1965 (51 y.o. Female) Treating RN: Carolyn CriglerFlinchum, Cheryl Primary Care Physician: Carolyn CrowAnderson, Marshall Other Clinician: Referring Physician:  Einar [name] Treating Physician/Extender: Skeet Simmer in Treatment: 12 Education Assessment Education Provided To: Caregiver discharge orders reviewed with caregiver Education Topics Provided Basic Hygiene: Handouts: Other: keep lotion to hands Methods: Explain/Verbal Responses: State content correctly Electronic Signature(s) Signed: 12/11/2016 3:20:43 PM By: [name] Entered By: [name] on 12/11/2016 13:10:54 [name], [name]  ([id number]) -------------------------------------------------------------------------------- Wound Assessment Details Patient Name: [name]. Date of Service: 12/11/2016 12:30 PM Medical Record Number: [id number] Patient Account Number: [id number] Date of Birth/Sex: July 14, 1965 (51 y.o. Female) Treating RN: [name] Primary Care Lashawndra Lampkins: [name] Other Clinician: Referring Cienna Dumais: [name] Treating Shadow Schedler/Extender: [name], [name] in Treatment: 12 Wound Status Wound Number: 1R Primary Etiology: Trauma, Other Wound Location: Right Hand - Web Between 1st and 2nd Wound Status: Healed - Epithelialized Digit Wounding Event: Trauma Date Acquired: 08/26/2016 [name] Of Treatment: 12 Clustered Wound: No Wound Measurements Length: (cm) 0 Width: (cm) 0 Depth: (cm) 0 Area: (cm) 0 Volume: (cm) 0 % Reduction in Area: 100% % Reduction in Volume: 100% Wound Description Classification: Partial Thickness Periwound Skin Texture Texture Color No Abnormalities Noted: No No Abnormalities Noted: No Moisture No Abnormalities Noted: No Treatment Notes Wound #1R (Right Hand - Web Between 1st and 2nd Digit) 1. Cleansed with: Clean wound with Normal Saline Notes no dressing wounds healed Electronic Signature(s) Signed: 12/11/2016 3:20:43 PM By: [name] Entered By: [name] on 12/11/2016 13:07:13 Szabo, [name] ([id number]) -------------------------------------------------------------------------------- Wound Assessment Details Patient Name: Carolyn Rieger K. Date of Service: 12/11/2016 12:30 PM Medical Record Number: [id number] Patient Account Number: [id number] Date of Birth/Sex: 1965/02/18 (51 y.o. Female) Treating RN: [name] Primary Care Idonia Zollinger: [name] Other Clinician: Referring Derrill Bagnell: [name] Treating Valerian Jewel/Extender: [name], [name] in Treatment: 12 Wound Status Wound  Number: 4 Primary Etiology: Trauma, Other Wound Location: Right, Lateral Hand - Dorsum Wound Status: Healed - Epithelialized Wounding Event: Bite Date Acquired: 10/23/2016 [name] Of Treatment: 6 Clustered Wound: No Wound Measurements Length: (cm) 0 Width: (cm) 0 Depth: (cm) 0 Area: (cm) 0 Volume: (cm) 0 % Reduction in Area: 100% % Reduction in Volume: 100% Wound Description Full Thickness Without Exposed Support Classification: Structures Periwound Skin Texture Texture Color No Abnormalities Noted: No No Abnormalities Noted: No Moisture No Abnormalities Noted: No Treatment Notes Wound #4 (Right, Lateral Hand - Dorsum) 1. Cleansed with: Clean wound with Normal Saline Notes no dressing wounds healed Electronic Signature(s) Signed: 12/11/2016 3:20:43 PM By: [name] Entered By: [name] on 12/11/2016 13:07:13 [name], [name] ([id number]) -------------------------------------------------------------------------------- Wound Assessment Details Patient Name: Carolyn Rieger K. Date of Service: 12/11/2016 12:30 PM Medical Record Number: [id number] Patient Account Number: [id number] Date of Birth/Sex: November 19, 1965 (51 y.o. Female) Treating RN: [name] Primary Care Jonise Weightman: [name] Other Clinician: Referring Nahima Ales: [name] Treating Judeen Geralds/Extender: [name], [name] in Treatment: 12 Wound Status Wound Number: 5 Primary Etiology: Trauma, Other Wound Location: Right, Medial Wrist Wound Status: Healed - Epithelialized Wounding Event: Trauma Date Acquired: 11/04/2016 [name] Of Treatment: 5 Clustered Wound: No Wound Measurements Length: (cm) 0 Width: (cm) 0 Depth: (cm) 0 Area: (cm) 0 Volume: (cm) 0 % Reduction in Area: 100% % Reduction in Volume: 100% Wound Description Full Thickness Without Exposed Support Classification: Structures Periwound Skin Texture Texture Color No Abnormalities Noted: No No  Abnormalities Noted: No Moisture No Abnormalities Noted: No Treatment Notes Wound #5 (Right, Medial Wrist) 1. Cleansed with: Clean wound with Normal Saline Notes no dressing wounds healed Electronic Signature(s) Signed: 12/11/2016 3:20:43 PM  By: Carolyn CriglerFlinchum, Cheryl Entered By: Carolyn CriglerFlinchum, Cheryl on 12/11/2016 13:07:14 [name], Toneka K. ([id number]) -------------------------------------------------------------------------------- Wound Assessment Details Patient Name: [name], Lillia K. Date of Service: 12/11/2016 12:30 PM Medical Record Number: [id number] Patient Account Number: [id number] Date of Birth/Sex: 12/06/1965 (51 y.o. Female) Treating RN: Carolyn CriglerFlinchum, Cheryl Primary Care Cherene Dobbins: Carolyn CrowAnderson, Marshall Other Clinician: Referring Lean Fayson: Carolyn CrowAnderson, Marshall Treating Aracely Rickett/Extender: Carolyn DibblesSTONE III, [name] in Treatment: 12 Wound Status Wound Number: 7 Primary Etiology: Trauma, Other Wound Location: Left, Dorsal Hand - Dorsum Wound Status: Healed - Epithelialized Wounding Event: Trauma Date Acquired: 11/05/2016 [name] Of Treatment: 5 Clustered Wound: No Wound Measurements Length: (cm) 0 Width: (cm) 0 Depth: (cm) 0 Area: (cm) 0 Volume: (cm) 0 % Reduction in Area: 100% % Reduction in Volume: 100% Wound Description Full Thickness Without Exposed Support Classification: Structures Periwound Skin Texture Texture Color No Abnormalities Noted: No No Abnormalities Noted: No Moisture No Abnormalities Noted: No Treatment Notes Wound #7 (Left, Dorsal Hand - Dorsum) 1. Cleansed with: Clean wound with Normal Saline Notes no dressing wounds healed Electronic Signature(s) Signed: 12/11/2016 3:20:43 PM By: Carolyn CriglerFlinchum, Cheryl Entered By: Carolyn CriglerFlinchum, Cheryl on 12/11/2016 13:07:14 Arlotta, Carolyn LadeMARGARET K. ([id number]) -------------------------------------------------------------------------------- Vitals Details Patient Name: [name], Latreece K. Date of Service: 12/11/2016 12:30  PM Medical Record Number: [id number] Patient Account Number: [id number] Date of Birth/Sex: 12/06/1965 (51 y.o. Female) Treating RN: Carolyn CriglerFlinchum, Cheryl Primary Care Copeland Lapier: Carolyn CrowAnderson, Marshall Other Clinician: Referring Jb Dulworth: Carolyn CrowAnderson, Marshall Treating Jaculin Rasmus/Extender: Carolyn DibblesSTONE III, [name] in Treatment: 12 Vital Signs Time Taken: 01:00 Temperature (F): 98.4 Pulse (bpm): 88 Respiratory Rate (breaths/min): 18 Reference Range: 80 - 120 mg / dl Electronic Signature(s) Signed: 12/11/2016 3:20:43 PM By: Carolyn CriglerFlinchum, Cheryl Entered By: Carolyn CriglerFlinchum, Cheryl on 12/11/2016 13:09:35

## 2017-02-18 ENCOUNTER — Other Ambulatory Visit: Payer: Self-pay

## 2017-02-18 ENCOUNTER — Ambulatory Visit
Admission: EM | Admit: 2017-02-18 | Discharge: 2017-02-18 | Disposition: A | Discharge status: Home / Self Care | Payer: Medicare Other | Attending: Family Medicine | Admitting: Family Medicine

## 2017-02-18 DIAGNOSIS — R21 Rash and other nonspecific skin eruption: Secondary | ICD-10-CM

## 2017-02-18 MED ORDER — MUPIROCIN 2 % EX OINT: 1.0000 "application " | TOPICAL_OINTMENT | Freq: Two times a day (BID) | CUTANEOUS | 0 refills | Status: DC

## 2017-02-18 MED ORDER — DESONIDE 0.05 % EX OINT: 1.0000 "application " | TOPICAL_OINTMENT | Freq: Two times a day (BID) | CUTANEOUS | 0 refills | Status: DC

## 2017-02-18 NOTE — Discharge Instructions (Signed)
Medications as prescribed. ° °Take care ° °Dr. Jaymere Alen  °

## 2017-02-18 NOTE — ED Provider Notes (Signed)
[facility]    CSN: [id number] Arrival date & time: 02/18/17  1609  History   Chief Complaint Chief Complaint  Patient presents with  . Rash   HPI  52 year old female presents with facial rash.  Patient has severe mental retardation and often bites her hands and attempts to bite other objects.  Family member states that she was at group home today and was biting her head rest.  She was subsequently noted to have a facial rash.  Group home was concerned and advised her to come for evaluation.   She presents today in her normal state per her family member.  She is acting and behaving at her baseline.  She has a left-sided facial rash.  Per the family member, there was no direct injury to the head rest that would suggest ingestion of a foreign body.  No medications or interventions tried.  No other associated symptoms.  No other concerns at this time.  Past Medical History:  Diagnosis Date  . Acne   . Cerebral palsy (HCC)   . Mental retardation   . Myopia   . Scoliosis   . Seizure disorder [facility])     Patient Active Problem List   Diagnosis Date Noted  . Mental retardation 02/21/2015  . Seizure disorder, primary (HCC) 02/21/2015  . Menopause 02/21/2015  . Abnormal liver function tests 02/21/2015    Past Surgical History:  Procedure Laterality Date  . PARTIAL HIP ARTHROPLASTY    . REVISION TOTAL KNEE ARTHROPLASTY      OB History    Gravida Para Term Preterm AB Living   0 0 0 0 0 0   SAB TAB Ectopic Multiple Live Births   0 0 0 0         Home Medications    Prior to Admission medications   Medication Sig Start Date End Date Taking? Authorizing Provider  hydrOXYzine (ATARAX/VISTARIL) 25 MG tablet Take 25 mg by mouth daily as needed.   Yes [provider]  Aluminum & Magnesium Hydroxide (MALDROXAL ANTACID PO) Take by mouth.    [provider]  aspirin 325 MG EC tablet Take 325 mg by mouth daily.    [provider]  baclofen  (LIORESAL) 10 MG tablet Take 10 mg by mouth 3 (three) times daily.    [provider]  carbamazepine (TEGRETOL) 100 MG chewable tablet Chew by mouth 2 (two) times daily.    [provider]  cetirizine (ZYRTEC) 10 MG chewable tablet Chew 10 mg by mouth daily.    [provider]  Control Gel Formula Dressing (DUODERM CGF BORDER DRESSING EX) Apply topically.    [provider]  desonide (DESOWEN) 0.05 % ointment Apply 1 application topically 2 (two) times daily. For 5-7 days. 02/18/17   Tommie Sams, DO  diazepam (VALIUM) 10 MG tablet Take 10 mg by mouth every 6 (six) hours as needed for anxiety.    [provider]  dicyclomine (BENTYL) 10 MG capsule Take 10 mg by mouth 4 (four) times daily -  before meals and at bedtime.    [provider]  ENSURE PLUS (ENSURE PLUS) LIQD Take 237 mLs by mouth.    [provider]  furosemide (LASIX) 20 MG tablet Take 20 mg by mouth.    [provider]  levothyroxine (SYNTHROID, LEVOTHROID) 100 MCG tablet Take 100 mcg by mouth daily before breakfast.    [provider]  loperamide (IMODIUM) 2 MG capsule Take by mouth  as needed for diarrhea or loose stools.    [provider]  LORazepam (ATIVAN) 1 MG tablet Take 1 mg by mouth every 8 (eight) hours.    [provider]  Magnesium Hydroxide (EQ MILK OF MAGNESIA PO) Take by mouth.    [provider]  metoCLOPramide (REGLAN) 10 MG tablet Take 10 mg by mouth 4 (four) times daily.    [provider]  mupirocin ointment (BACTROBAN) 2 % Apply 1 application topically 2 (two) times daily. For 5-7 days. 02/18/17   Everlene Otherook, Leevi Cullars G, DO  OLANZapine (ZYPREXA) 5 MG tablet Take 5 mg by mouth at bedtime.    [provider]  omeprazole (PRILOSEC) 20 MG capsule Take 20 mg by mouth daily.    [provider]  potassium chloride (K-DUR,KLOR-CON) 10 MEQ tablet Take 10 mEq by mouth 2 (two) times daily.    [provider]  Prenatal Vit-Fe Psac Cmplx-FA (POLY IRON PN FORTE PO) Take by mouth.    [provider]  Pseudoephedrine-DM-GG (ROBITUSSIN CF PO) Take by mouth.    [provider]  Skin Protectants, Misc. (EUCERIN) cream Apply topically as needed for dry skin.    [provider]  sucralfate (CARAFATE) 1 GM/10ML suspension Take 1 g by mouth 4 (four) times daily -  with meals and at bedtime.    [provider]  Sulfacetamide Sodium, Acne, (KLARON) 10 % LOTN Apply topically.    [provider]  tolnaftate (TINACTIN) 1 % spray Apply topically 2 (two) times daily.    [provider]  vitamin E 100 UNIT capsule Take by mouth daily.    [provider]  ziprasidone (GEODON) 80 MG capsule Take 80 mg by mouth 2 (two) times daily with a meal.    [provider]    Family History Family History  Family history unknown: Yes    Social History Social History   Tobacco Use  . Smoking status: Never Smoker  . Smokeless tobacco: Never Used  Substance Use Topics  . Alcohol use: No  . Drug use: No     Allergies   Patient has no known allergies.   Review of Systems Review of Systems  Constitutional: Negative.   Skin: Positive for rash.   Physical Exam Triage Vital Signs ED Triage Vitals [02/18/17 1638]  Enc Vitals Group     BP (!) 137/93     Pulse Rate 73     Resp 16     Temp 97.9 F (36.6 C)     Temp Source Axillary     SpO2 98 %     Weight      Height      Head Circumference      Peak Flow      Pain Score      Pain Loc      Pain Edu?      Excl. in GC?    Updated Vital Signs BP (!) 137/93 (BP Location: Left Arm)   Pulse 73   Temp 97.9 F (36.6 C) (Axillary)   Resp 16   SpO2 98%    Physical Exam  Constitutional: She appears well-developed and well-nourished. No distress.  HENT:  Head: Normocephalic and atraumatic.  Nose: Nose normal.  Eyes: Conjunctivae are normal. Right eye exhibits no discharge.  Left eye exhibits no discharge.  Pulmonary/Chest: Effort normal. No respiratory distress.  Neurological: She is alert.  Interactive and at baseline per family member.  Skin:  Erythematous, papular  facial rash noted on the left side, maxillary region.  Nursing note and vitals reviewed.  UC Treatments / Results  Labs (all labs ordered are listed, but only abnormal results are displayed) Labs Reviewed - No data to display  EKG  EKG Interpretation None       Radiology No results found.  Procedures Procedures (including critical care time)  Medications Ordered in UC Medications - No data to display   Initial Impression / Assessment and Plan / UC Course  I have reviewed the triage vital signs and the nursing notes.  Pertinent labs & imaging results that were available during my care of the patient were reviewed by me and considered in my medical decision making (see chart for details).     52 year old female presents with a facial rash.  Appears to be contact/irritant.  Treating with mupirocin to ensure no underlying bacterial infection.  Also treating with desonide.  Final Clinical Impressions(s) / UC Diagnoses   Final diagnoses:  Facial rash    ED Discharge Orders        Ordered    mupirocin ointment (BACTROBAN) 2 %  2 times daily     02/18/17 1649    desonide (DESOWEN) 0.05 % ointment  2 times daily     02/18/17 1649     Controlled Substance Prescriptions [place] Controlled Substance Registry consulted? Not Applicable   Tommie Sams, DO 02/18/17 1728

## 2017-02-18 NOTE — ED Triage Notes (Signed)
Pt with left sided facial redness starting today. Pt is unable to communicate verbally regarding pain or itching. Arrives with caregiver. Caregiver also reports pt has been eating at her headrest and concerned she inhaled or ingested some of the particles.

## 2017-02-24 NOTE — Progress Notes (Signed)
GYN ENCOUNTER NOTE  Subjective:       [name] is a 52 y.o. G0P0000 female is here for gynecologic evaluation of the following issues:  1.Annual GYN physical   The patient is a 52 year old white female with severe mental retardation and seizure disorder,On no hormonal medications, having no vaginal bleeding or discharge, presents from the [facility] for gynecologic evaluation. Patient has chronic incontinence for which she wears diapers.  No hygiene issues have been identified regarding her perineum. The patient was started on aspirin last year because of her immobility.   Gynecologic History No LMP recorded. Patient is postmenopausal. Contraception: none Last mammogram:2015-[facility]-RADS 1 Pap- 02/2016 neg/neg  Obstetric History OB History  Gravida Para Term Preterm AB Living  0 0 0 0 0 0  SAB TAB Ectopic Multiple Live Births  0 0 0 0          Past Medical History:  Diagnosis Date  . Acne   . Cerebral palsy (HCC)   . Mental retardation   . Myopia   . Scoliosis   . Seizure disorder [facility])     Past Surgical History:  Procedure Laterality Date  . PARTIAL HIP ARTHROPLASTY    . REVISION TOTAL KNEE ARTHROPLASTY      Current Outpatient Medications on File Prior to Visit  Medication Sig Dispense Refill  . Aluminum & Magnesium Hydroxide (MALDROXAL ANTACID PO) Take by mouth.    Marland Kitchen aspirin 325 MG EC tablet Take 325 mg by mouth daily.    . baclofen (LIORESAL) 10 MG tablet Take 10 mg by mouth 3 (three) times daily.    . carbamazepine (TEGRETOL) 100 MG chewable tablet Chew by mouth 2 (two) times daily.    . cetirizine (ZYRTEC) 10 MG chewable tablet Chew 10 mg by mouth daily.    . Control Gel Formula Dressing (DUODERM CGF BORDER DRESSING EX) Apply topically.    Marland Kitchen desonide (DESOWEN) 0.05 % ointment Apply 1 application topically 2 (two) times daily. For 5-7 days. 15 g 0  . diazepam (VALIUM) 10 MG tablet Take 10 mg by mouth every 6 (six) hours as needed for anxiety.    .  dicyclomine (BENTYL) 10 MG capsule Take 10 mg by mouth 4 (four) times daily -  before meals and at bedtime.    . ENSURE PLUS (ENSURE PLUS) LIQD Take 237 mLs by mouth.    . furosemide (LASIX) 20 MG tablet Take 20 mg by mouth.    . hydrOXYzine (ATARAX/VISTARIL) 25 MG tablet Take 25 mg by mouth daily as needed.    Marland Kitchen levothyroxine (SYNTHROID, LEVOTHROID) 100 MCG tablet Take 100 mcg by mouth daily before breakfast.    . loperamide (IMODIUM) 2 MG capsule Take by mouth as needed for diarrhea or loose stools.    Marland Kitchen LORazepam (ATIVAN) 1 MG tablet Take 1 mg by mouth every 8 (eight) hours.    . Magnesium Hydroxide (EQ MILK OF MAGNESIA PO) Take by mouth.    . metoCLOPramide (REGLAN) 10 MG tablet Take 10 mg by mouth 4 (four) times daily.    . mupirocin ointment (BACTROBAN) 2 % Apply 1 application topically 2 (two) times daily. For 5-7 days. 22 g 0  . OLANZapine (ZYPREXA) 5 MG tablet Take 5 mg by mouth at bedtime.    Marland Kitchen omeprazole (PRILOSEC) 20 MG capsule Take 20 mg by mouth daily.    . potassium chloride (K-DUR,KLOR-CON) 10 MEQ tablet Take 10 mEq by mouth 2 (two) times daily.    Marland Kitchen  Prenatal Vit-Fe Psac Cmplx-FA (POLY IRON PN FORTE PO) Take by mouth.    . Pseudoephedrine-DM-GG (ROBITUSSIN CF PO) Take by mouth.    . Skin Protectants, Misc. (EUCERIN) cream Apply topically as needed for dry skin.    Marland Kitchen. sucralfate (CARAFATE) 1 GM/10ML suspension Take 1 g by mouth 4 (four) times daily -  with meals and at bedtime.    . Sulfacetamide Sodium, Acne, (KLARON) 10 % LOTN Apply topically.    Marland Kitchen. tolnaftate (TINACTIN) 1 % spray Apply topically 2 (two) times daily.    . vitamin E 100 UNIT capsule Take by mouth daily.    . ziprasidone (GEODON) 80 MG capsule Take 80 mg by mouth 2 (two) times daily with a meal.     No current facility-administered medications on file prior to visit.     No Known Allergies  Social History   Socioeconomic History  . Marital status: Single    Spouse name: Not on file  . Number of children:  Not on file  . Years of education: Not on file  . Highest education level: Not on file  Social Needs  . Financial resource strain: Not on file  . Food insecurity - worry: Not on file  . Food insecurity - inability: Not on file  . Transportation needs - medical: Not on file  . Transportation needs - non-medical: Not on file  Occupational History  . Not on file  Tobacco Use  . Smoking status: Never Smoker  . Smokeless tobacco: Never Used  Substance and Sexual Activity  . Alcohol use: No  . Drug use: No  . Sexual activity: Not Currently  Other Topics Concern  . Not on file  Social History Narrative  . Not on file    Family History  Family history unknown: Yes    The following portions of the patient's history were reviewed and updated as appropriate: allergies, current medications, past family history, past medical history, past social history, past surgical history and problem list.  Review of Systems Unobtainable Chronic incontinence No abnormal uterine bleeding  Objective:   BP 108/68   Pulse 69   Ht 4\' 10"  (1.473 m)   Wt 108 lb (49 kg)   BMI 22.57 kg/m  Menopausal white female lying on the exam table with assistance of providers; fetal position. Head and neck: No adenopathy or thyromegaly. Lungs: Clear Heart: Regular rate and rhythm without murmur. Abdomen: Rigid, with voluntary guarding; nontender; no hepatomegaly; no hernia. Bowel sounds present Pelvic: External genitalia-no lesions, hyperemia, or epithelial skin breakdown. BUS-normal. Vagina-normal introitus Cervix: Not examined Uterus-not examined Bimanual-not done Rectovaginal-external hemorrhoids noted; no rash or lesions Extremities: Contractures.  Extremities no significant skin lesions identified; muscle atrophy is present  ASSESSMENT: 1.  Wellness exam, gynecologic, stable. 2.  Seizure disorder. 3.  Mental retardation. 4.  Menopausal state 5.  Mobility limited, on aspirin for clot  prevention  PLAN: 1.  Yearly mammogram. 2. Pap smear due 2021 3.  Monitor for any abnormal uterine bleeding or any gynecologic symptoms. 4. Do not recommend HRT therapy in this patient due to potential increased risk for DVT in sedentary patient.  5. Follow-up on a yearly basis or as needed.  Darol Destinerystal Miller, CMA  Herold HarmsMartin A Demarquez Ciolek, MD  Note: This dictation was prepared with Dragon dictation along with smaller phrase technology. Any transcriptional errors that result from this process are unintentional.

## 2017-02-27 ENCOUNTER — Ambulatory Visit (INDEPENDENT_AMBULATORY_CARE_PROVIDER_SITE_OTHER): Payer: Medicare Other | Admitting: Obstetrics and Gynecology

## 2017-02-27 ENCOUNTER — Encounter: Payer: Self-pay | Admitting: Obstetrics and Gynecology

## 2017-02-27 VITALS — BP 108/68 | HR 69 | Ht <= 58 in | Wt 108.0 lb

## 2017-02-27 DIAGNOSIS — Z780 Asymptomatic menopausal state: Secondary | ICD-10-CM | POA: Diagnosis not present

## 2017-02-27 DIAGNOSIS — F79 Unspecified intellectual disabilities: Secondary | ICD-10-CM | POA: Diagnosis not present

## 2017-02-27 DIAGNOSIS — Z01419 Encounter for gynecological examination (general) (routine) without abnormal findings: Secondary | ICD-10-CM

## 2017-02-27 DIAGNOSIS — Z0000 Encounter for general adult medical examination without abnormal findings: Secondary | ICD-10-CM

## 2017-02-27 DIAGNOSIS — G40909 Epilepsy, unspecified, not intractable, without status epilepticus: Secondary | ICD-10-CM

## 2017-02-27 NOTE — Patient Instructions (Addendum)
1.  No Pap smear done 2.  Mammogram recommended; authorization given 3.  Continue with aspirin daily 4.  Monitor for any abnormal uterine bleeding or gynecologic issues regarding perineal hygiene 5.  Return in 1 year   Health Maintenance for Postmenopausal Women Menopause is a normal process in which your reproductive ability comes to an end. This process happens gradually over a span of months to years, usually between the ages of 49 and 47. Menopause is complete when you have missed 12 consecutive menstrual periods. It is important to talk with your health care provider about some of the most common conditions that affect postmenopausal women, such as heart disease, cancer, and bone loss (osteoporosis). Adopting a healthy lifestyle and getting preventive care can help to promote your health and wellness. Those actions can also lower your chances of developing some of these common conditions. What should I know about menopause? During menopause, you may experience a number of symptoms, such as:  Moderate-to-severe hot flashes.  Night sweats.  Decrease in sex drive.  Mood swings.  Headaches.  Tiredness.  Irritability.  Memory problems.  Insomnia.  Choosing to treat or not to treat menopausal changes is an individual decision that you make with your health care provider. What should I know about hormone replacement therapy and supplements? Hormone therapy products are effective for treating symptoms that are associated with menopause, such as hot flashes and night sweats. Hormone replacement carries certain risks, especially as you become older. If you are thinking about using estrogen or estrogen with progestin treatments, discuss the benefits and risks with your health care provider. What should I know about heart disease and stroke? Heart disease, heart attack, and stroke become more likely as you age. This may be due, in part, to the hormonal changes that your body experiences  during menopause. These can affect how your body processes dietary fats, triglycerides, and cholesterol. Heart attack and stroke are both medical emergencies. There are many things that you can do to help prevent heart disease and stroke:  Have your blood pressure checked at least every 1-2 years. High blood pressure causes heart disease and increases the risk of stroke.  If you are 62-7 years old, ask your health care provider if you should take aspirin to prevent a heart attack or a stroke.  Do not use any tobacco products, including cigarettes, chewing tobacco, or electronic cigarettes. If you need help quitting, ask your health care provider.  It is important to eat a healthy diet and maintain a healthy weight. ? Be sure to include plenty of vegetables, fruits, low-fat dairy products, and lean protein. ? Avoid eating foods that are high in solid fats, added sugars, or salt (sodium).  Get regular exercise. This is one of the most important things that you can do for your health. ? Try to exercise for at least 150 minutes each week. The type of exercise that you do should increase your heart rate and make you sweat. This is known as moderate-intensity exercise. ? Try to do strengthening exercises at least twice each week. Do these in addition to the moderate-intensity exercise.  Know your numbers.Ask your health care provider to check your cholesterol and your blood glucose. Continue to have your blood tested as directed by your health care provider.  What should I know about cancer screening? There are several types of cancer. Take the following steps to reduce your risk and to catch any cancer development as early as possible. Breast Cancer  Practice breast self-awareness. ? This means understanding how your breasts normally appear and feel. ? It also means doing regular breast self-exams. Let your health care provider know about any changes, no matter how small.  If you are 90 or  older, have a clinician do a breast exam (clinical breast exam or CBE) every year. Depending on your age, family history, and medical history, it may be recommended that you also have a yearly breast X-ray (mammogram).  If you have a family history of breast cancer, talk with your health care provider about genetic screening.  If you are at high risk for breast cancer, talk with your health care provider about having an MRI and a mammogram every year.  Breast cancer (BRCA) gene test is recommended for women who have family members with BRCA-related cancers. Results of the assessment will determine the need for genetic counseling and BRCA1 and for BRCA2 testing. BRCA-related cancers include these types: ? Breast. This occurs in males or females. ? Ovarian. ? Tubal. This may also be called fallopian tube cancer. ? Cancer of the abdominal or pelvic lining (peritoneal cancer). ? Prostate. ? Pancreatic.  Cervical, Uterine, and Ovarian Cancer Your health care provider may recommend that you be screened regularly for cancer of the pelvic organs. These include your ovaries, uterus, and vagina. This screening involves a pelvic exam, which includes checking for microscopic changes to the surface of your cervix (Pap test).  For women ages 21-65, health care providers may recommend a pelvic exam and a Pap test every three years. For women ages 63-65, they may recommend the Pap test and pelvic exam, combined with testing for human papilloma virus (HPV), every five years. Some types of HPV increase your risk of cervical cancer. Testing for HPV may also be done on women of any age who have unclear Pap test results.  Other health care providers may not recommend any screening for nonpregnant women who are considered low risk for pelvic cancer and have no symptoms. Ask your health care provider if a screening pelvic exam is right for you.  If you have had past treatment for cervical cancer or a condition that  could lead to cancer, you need Pap tests and screening for cancer for at least 20 years after your treatment. If Pap tests have been discontinued for you, your risk factors (such as having a new sexual partner) need to be reassessed to determine if you should start having screenings again. Some women have medical problems that increase the chance of getting cervical cancer. In these cases, your health care provider may recommend that you have screening and Pap tests more often.  If you have a family history of uterine cancer or ovarian cancer, talk with your health care provider about genetic screening.  If you have vaginal bleeding after reaching menopause, tell your health care provider.  There are currently no reliable tests available to screen for ovarian cancer.  Lung Cancer Lung cancer screening is recommended for adults 67-59 years old who are at high risk for lung cancer because of a history of smoking. A yearly low-dose CT scan of the lungs is recommended if you:  Currently smoke.  Have a history of at least 30 pack-years of smoking and you currently smoke or have quit within the past 15 years. A pack-year is smoking an average of one pack of cigarettes per day for one year.  Yearly screening should:  Continue until it has been 15 years since you quit.  Stop if you develop a health problem that would prevent you from having lung cancer treatment.  Colorectal Cancer  This type of cancer can be detected and can often be prevented.  Routine colorectal cancer screening usually begins at age 38 and continues through age 52.  If you have risk factors for colon cancer, your health care provider may recommend that you be screened at an earlier age.  If you have a family history of colorectal cancer, talk with your health care provider about genetic screening.  Your health care provider may also recommend using home test kits to check for hidden blood in your stool.  A small camera  at the end of a tube can be used to examine your colon directly (sigmoidoscopy or colonoscopy). This is done to check for the earliest forms of colorectal cancer.  Direct examination of the colon should be repeated every 5-10 years until age 5. However, if early forms of precancerous polyps or small growths are found or if you have a family history or genetic risk for colorectal cancer, you may need to be screened more often.  Skin Cancer  Check your skin from head to toe regularly.  Monitor any moles. Be sure to tell your health care provider: ? About any new moles or changes in moles, especially if there is a change in a mole's shape or color. ? If you have a mole that is larger than the size of a pencil eraser.  If any of your family members has a history of skin cancer, especially at a young age, talk with your health care provider about genetic screening.  Always use sunscreen. Apply sunscreen liberally and repeatedly throughout the day.  Whenever you are outside, protect yourself by wearing long sleeves, pants, a wide-brimmed hat, and sunglasses.  What should I know about osteoporosis? Osteoporosis is a condition in which bone destruction happens more quickly than new bone creation. After menopause, you may be at an increased risk for osteoporosis. To help prevent osteoporosis or the bone fractures that can happen because of osteoporosis, the following is recommended:  If you are 83-82 years old, get at least 1,000 mg of calcium and at least 600 mg of vitamin D per day.  If you are older than age 59 but younger than age 23, get at least 1,200 mg of calcium and at least 600 mg of vitamin D per day.  If you are older than age 11, get at least 1,200 mg of calcium and at least 800 mg of vitamin D per day.  Smoking and excessive alcohol intake increase the risk of osteoporosis. Eat foods that are rich in calcium and vitamin D, and do weight-bearing exercises several times each week as  directed by your health care provider. What should I know about how menopause affects my mental health? Depression may occur at any age, but it is more common as you become older. Common symptoms of depression include:  Low or sad mood.  Changes in sleep patterns.  Changes in appetite or eating patterns.  Feeling an overall lack of motivation or enjoyment of activities that you previously enjoyed.  Frequent crying spells.  Talk with your health care provider if you think that you are experiencing depression. What should I know about immunizations? It is important that you get and maintain your immunizations. These include:  Tetanus, diphtheria, and pertussis (Tdap) booster vaccine.  Influenza every year before the flu season begins.  Pneumonia vaccine.  Shingles vaccine.  Your  health care provider may also recommend other immunizations. This information is not intended to replace advice given to you by your health care provider. Make sure you discuss any questions you have with your health care provider. Document Released: 03/01/2005 Document Revised: 07/28/2015 Document Reviewed: 10/11/2014 Elsevier Interactive Patient Education  2018 [employer]  

## 2017-03-23 IMAGING — US US ABDOMEN LIMITED
1 series · 14 of 25 positions shown · non-contrast
Comparison: No prior .

CLINICAL DATA: Abnormal LFTs.

EXAM:
US ABDOMEN LIMITED - RIGHT UPPER QUADRANT

[Series 1: us abdomen limited · 0.19mm/px · 14 of 53 slices shown]
[im 1/53]
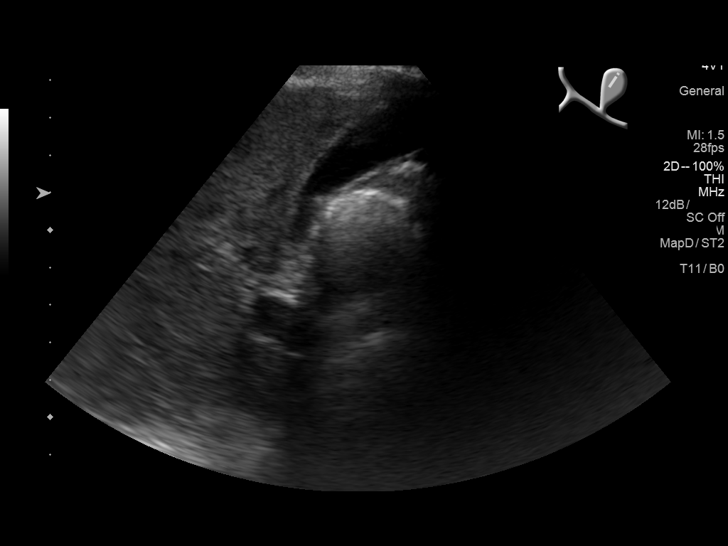
[im 5/53]
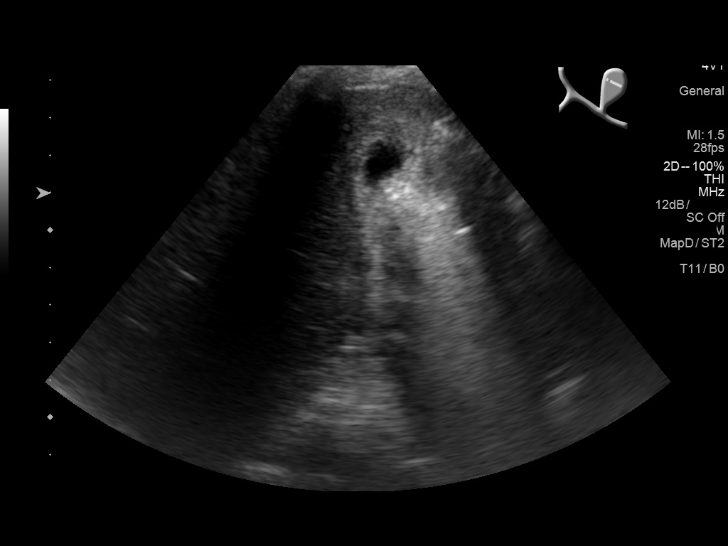
[im 9/53]
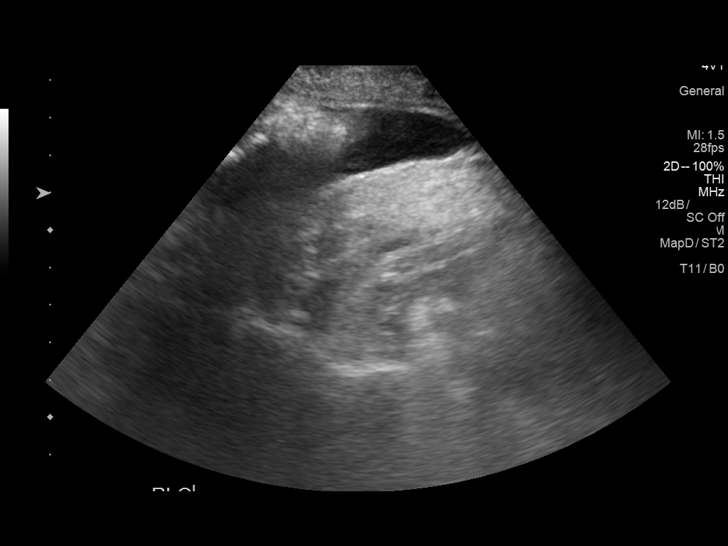
[im 14/53]
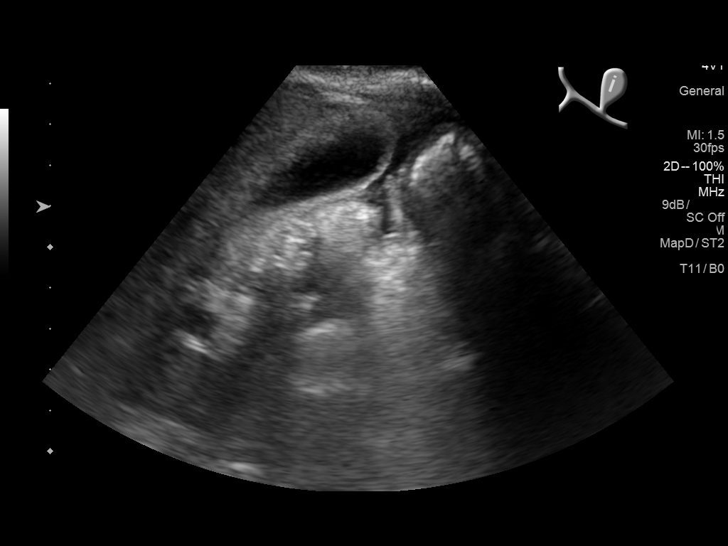
[im 18/53]
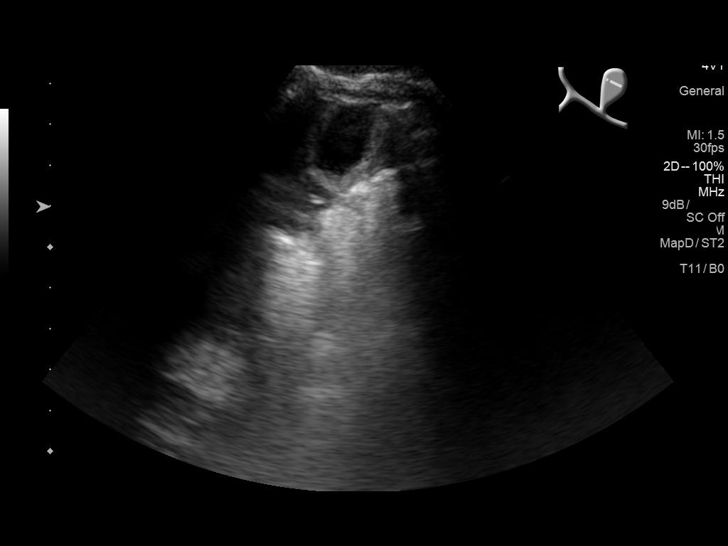
[im 20/53]
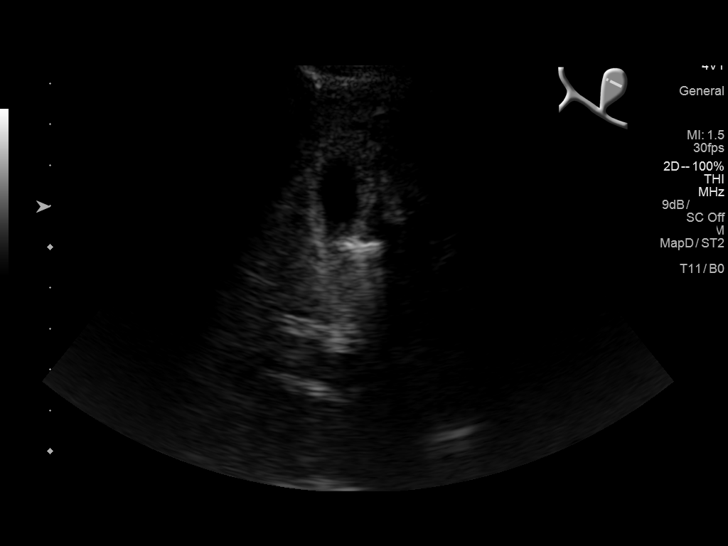
[im 24/53]
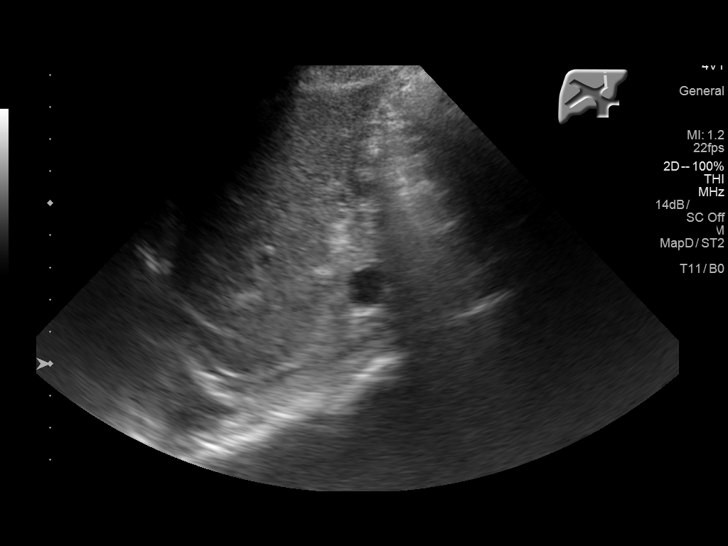
[im 29/53]
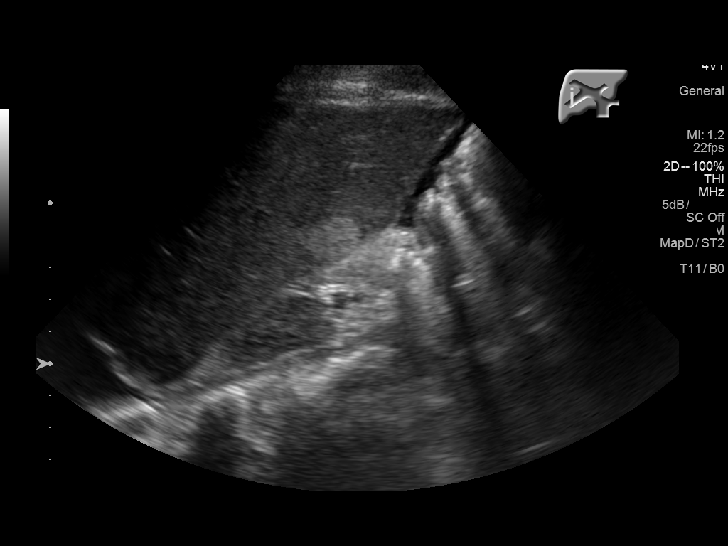
[im 33/53]
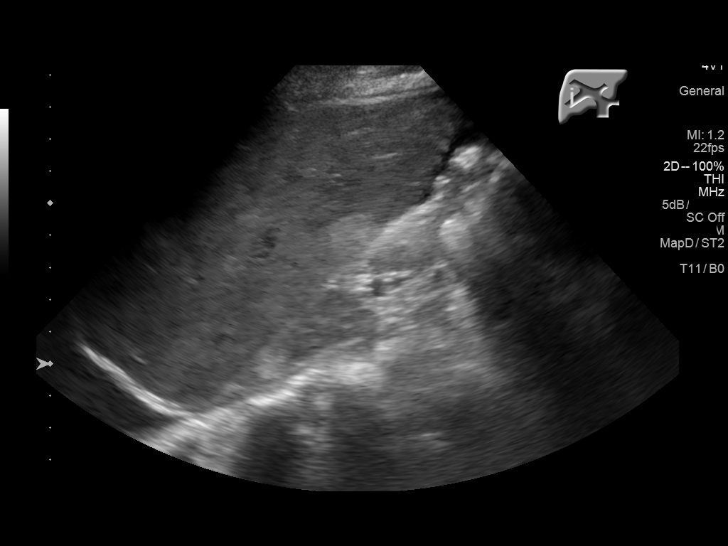
[im 35/53]
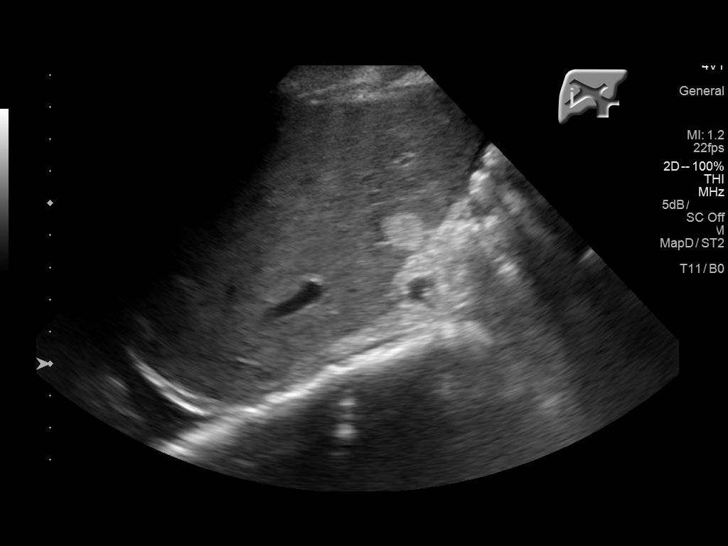
[im 40/53]
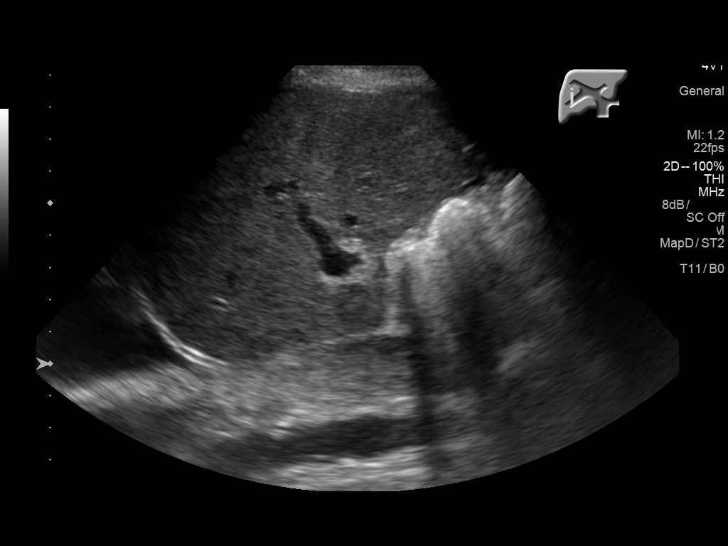
[im 44/53]
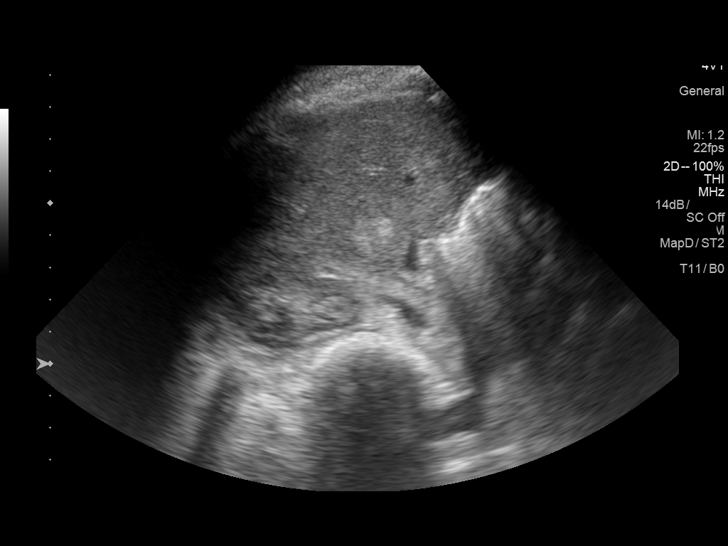
[im 48/53]
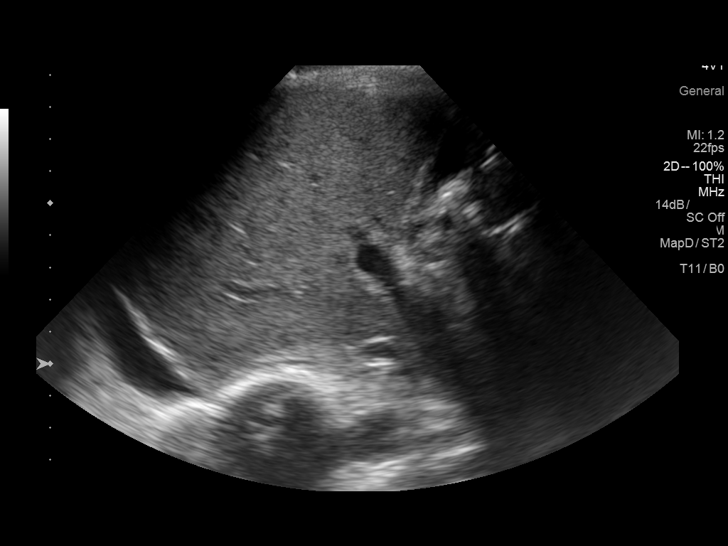
[im 53/53]
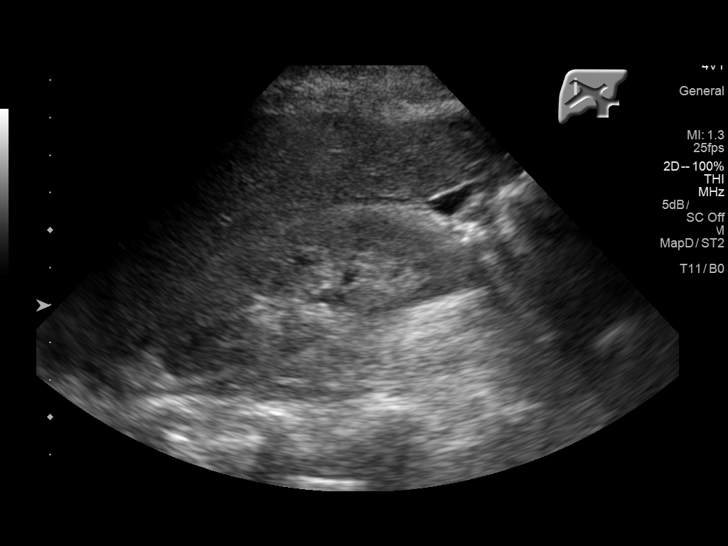

[14 of 25 positions shown; findings below may reference images not displayed]

FINDINGS: Gallbladder:

No gallstones or wall thickening visualized. No sonographic Murphy
sign noted by sonographer.

Common bile duct:

Diameter: 2.3 mm

Liver:

1.7 x 1.6 x 1.4 cm echogenic mass in the right hepatic lobe.
Statistically this most likely represents a hemangioma.
Gadolinium-enhanced MRI can be obtained for confirmation. Mild
increased hepatic echogenicity suggesting fatty infiltration and/or
hepatocellular disease. Mild ascites. Right pleural effusion.
IMPRESSION: 1. No gallstones or biliary distention.
2. 1.7 cm echogenic mass in the right hepatic lobe. This is
statistically most likely a hemangioma. Gadolinium-enhanced MRI of
the abdomen can be obtained for confirmation.
3. Mild increased hepatic echogenicity suggesting fatty infiltration
and/or hepatocellular disease.
4. Mild ascites.  Right pleural effusion.

## 2018-03-03 ENCOUNTER — Encounter: Payer: Medicare Other | Admitting: Obstetrics and Gynecology

## 2018-03-29 ENCOUNTER — Ambulatory Visit
Admission: EM | Admit: 2018-03-29 | Discharge: 2018-03-29 | Disposition: A | Discharge status: Home / Self Care | Payer: Medicare Other

## 2018-03-29 ENCOUNTER — Other Ambulatory Visit: Payer: Self-pay

## 2018-03-29 DIAGNOSIS — J069 Acute upper respiratory infection, unspecified: Secondary | ICD-10-CM

## 2018-03-29 DIAGNOSIS — R05 Cough: Secondary | ICD-10-CM | POA: Diagnosis not present

## 2018-03-29 DIAGNOSIS — R0981 Nasal congestion: Secondary | ICD-10-CM

## 2018-03-29 MED ORDER — PSEUDOEPH-BROMPHEN-DM 30-2-10 MG/5ML PO SYRP: 5.0000 mL | ORAL_SOLUTION | Freq: Four times a day (QID) | ORAL | 0 refills | Status: DC | PRN

## 2018-03-29 MED ORDER — FLUTICASONE PROPIONATE 50 MCG/ACT NA SUSP: 2.0000 | Freq: Every day | NASAL | 0 refills | Status: DC

## 2018-03-29 NOTE — ED Provider Notes (Signed)
[facility]    CSN: [id number] Arrival date & time: 03/29/18  1057     History   Chief Complaint Chief Complaint  Patient presents with  . Cough  . Nasal Congestion    HPI [name] is a 53 y.o. female.   HPI  53 year old female with mental retardation non-verbal presents with a cough runny nose and a hoarse voice that she has had for a couple of days.  She lives in a group home with 6 residents.  Most of them have had very similar symptoms and 1 with pneumonia.  To day  her vital signs are normal.  The caretaker states that that she has not been eating as much thinking that maybe she has a sore throat.  Has not been running a fever.      Past Medical History:  Diagnosis Date  . Acne   . Cerebral palsy (HCC)   . Mental retardation   . Myopia   . Scoliosis   . Seizure disorder [facility])     Patient Active Problem List   Diagnosis Date Noted  . Mental retardation 02/21/2015  . Seizure disorder, primary (HCC) 02/21/2015  . Menopause 02/21/2015  . Abnormal liver function tests 02/21/2015    Past Surgical History:  Procedure Laterality Date  . PARTIAL HIP ARTHROPLASTY    . REVISION TOTAL KNEE ARTHROPLASTY      OB History    Gravida  0   Para  0   Term  0   Preterm  0   AB  0   Living  0     SAB  0   TAB  0   Ectopic  0   Multiple  0   Live Births               Home Medications    Prior to Admission medications   Medication Sig Start Date End Date Taking? Authorizing Provider  metoCLOPramide (REGLAN) 5 MG tablet Take 5 mg by mouth 4 (four) times daily.   Yes [provider]  aspirin 325 MG EC tablet Take 325 mg by mouth daily.    [provider]  baclofen (LIORESAL) 10 MG tablet Take 10 mg by mouth 3 (three) times daily.    [provider]  brompheniramine-pseudoephedrine-DM 30-2-10 MG/5ML syrup Take 5 mLs by mouth 4 (four) times daily as needed. 03/29/18   Lutricia Feil, PA-C  carbamazepine  (TEGRETOL) 100 MG chewable tablet Chew by mouth 2 (two) times daily.    [provider]  cetirizine (ZYRTEC) 10 MG chewable tablet Chew 10 mg by mouth daily.    [provider]  cholecalciferol (VITAMIN D) 1000 units tablet Take 1,000 Units by mouth daily.    [provider]  Control Gel Formula Dressing (DUODERM CGF BORDER DRESSING EX) Apply topically.    [provider]  desonide (DESOWEN) 0.05 % ointment Apply 1 application topically 2 (two) times daily. For 5-7 days. 02/18/17   Tommie Sams, DO  dicyclomine (BENTYL) 10 MG capsule Take 10 mg by mouth 4 (four) times daily -  before meals and at bedtime.    [provider]  ENSURE PLUS (ENSURE PLUS) LIQD Take 237 mLs by mouth.    [provider]  fluticasone (FLONASE) 50 MCG/ACT nasal spray Place 2 sprays into both nostrils daily. 03/29/18   Lutricia Feil, PA-C  furosemide (LASIX) 20 MG tablet Take 20 mg by mouth.    [provider]  levothyroxine (SYNTHROID, LEVOTHROID) 100 MCG tablet Take 100 mcg by mouth daily before breakfast.    [provider]  loperamide (IMODIUM) 2 MG capsule Take by mouth as needed for diarrhea or loose stools.    [provider]  LORazepam (ATIVAN) 1 MG tablet Take 1 mg by mouth every 8 (eight) hours.    [provider]  Magnesium Hydroxide (EQ MILK OF MAGNESIA PO) Take by mouth.    [provider]  Multiple Vitamin (MULTIVITAMIN) capsule Take 1 capsule by mouth daily.    [provider]  mupirocin ointment (BACTROBAN) 2 % Apply 1 application topically 2 (two) times daily. For 5-7 days. 02/18/17   Everlene Otherook, Jayce G, DO  OLANZapine (ZYPREXA) 5 MG tablet Take 5 mg by mouth at bedtime.    [provider]  omeprazole (PRILOSEC) 20 MG capsule Take 20 mg by mouth daily.    [provider]  potassium chloride (K-DUR,KLOR-CON) 10 MEQ tablet Take 10 mEq by mouth 2 (two) times daily.    [provider]   Prenatal Vit-Fe Psac Cmplx-FA (POLY IRON PN FORTE PO) Take by mouth.    [provider]  Skin Protectants, Misc. (EUCERIN) cream Apply topically as needed for dry skin.    [provider]  sucralfate (CARAFATE) 1 GM/10ML suspension Take 1 g by mouth 4 (four) times daily -  with meals and at bedtime.    [provider]  Sulfacetamide Sodium, Acne, (KLARON) 10 % LOTN Apply topically.    [provider]  tolnaftate (TINACTIN) 1 % spray Apply topically 2 (two) times daily.    [provider]  vitamin E 100 UNIT capsule Take by mouth daily.    [provider]    Family History Family History  Family history unknown: Yes    Social History Social History   Tobacco Use  . Smoking status: Never Smoker  . Smokeless tobacco: Never Used  Substance Use Topics  . Alcohol use: No  . Drug use: No     Allergies   Patient has no known allergies.   Review of Systems Review of Systems  Constitutional: Positive for activity change and appetite change. Negative for chills, diaphoresis, fatigue and fever.  HENT: Positive for voice change.   Respiratory: Positive for cough.   All other systems reviewed and are negative.    Physical Exam Triage Vital Signs ED Triage Vitals [03/29/18 1131]  Enc Vitals Group     BP 121/77     Pulse Rate 86     Resp 16     Temp 98.1 F (36.7 C)     Temp Source Oral     SpO2 98 %     Weight      Height      Head Circumference      Peak Flow      Pain Score      Pain Loc      Pain Edu?      Excl. in GC?    No data found.  Updated Vital Signs BP 121/77 (BP Location: Left Arm)   Pulse 86   Temp 98.1 F (36.7 C) (Oral)   Resp 16   SpO2 98%   Visual Acuity Right Eye Distance:   Left Eye Distance:   Bilateral Distance:    Right Eye Near:   Left Eye Near:    Bilateral Near:     Physical Exam Vitals signs and nursing note reviewed.  Constitutional:  General: She is not in acute  distress.    Appearance: Normal appearance. She is not ill-appearing, toxic-appearing or diaphoretic.  HENT:     Head: Normocephalic and atraumatic.     Right Ear: There is no impacted cerumen.     Left Ear: There is no impacted cerumen.     Nose: Congestion present.     Mouth/Throat:     Comments: She would not cooperate opening mouth for evaluation of the mouth and throat. Eyes:     General:        Right eye: No discharge.        Left eye: No discharge.     Conjunctiva/sclera: Conjunctivae normal.  Neck:     Musculoskeletal: Normal range of motion and neck supple. No muscular tenderness.  Pulmonary:     Effort: Pulmonary effort is normal.     Breath sounds: Normal breath sounds.  Musculoskeletal:     Comments: Patient confined to a wheelchair.  Skin:    General: Skin is warm and dry.  Neurological:     Mental Status: She is alert.      UC Treatments / Results  Labs (all labs ordered are listed, but only abnormal results are displayed) Labs Reviewed - No data to display  EKG None  Radiology No results found.  Procedures Procedures (including critical care time)  Medications Ordered in UC Medications - No data to display  Initial Impression / Assessment and Plan / UC Course  I have reviewed the triage vital signs and the nursing notes.  Pertinent labs & imaging results that were available during my care of the patient were reviewed by me and considered in my medical decision making (see chart for details).   Presents with very limited history nonverbal not appear ill or toxic.  Exam is reassuring as much as can be done.  We will place her on Bromfed and Flonase nasal spray.  Is not appear to have the flu and this appears to be likely a virus.  If she runs high fevers or worsens recommend he take her to the emergency room or to her primary care physician.   Final Clinical Impressions(s) / UC Diagnoses   Final diagnoses:  Upper respiratory tract infection,  unspecified type     Discharge Instructions     Patient appears to have a upper respiratory infection that is likely viral.  If she develops high fevers or increasing cough along with listlessness return to our clinic or take her to the emergency room or her primary care physician.   ED Prescriptions    Medication Sig Dispense Auth. Provider   brompheniramine-pseudoephedrine-DM 30-2-10 MG/5ML syrup Take 5 mLs by mouth 4 (four) times daily as needed. 120 mL Ovid Curd P, PA-C   fluticasone (FLONASE) 50 MCG/ACT nasal spray Place 2 sprays into both nostrils daily. 16 g Lutricia Feil, PA-C     Controlled Substance Prescriptions [place] Controlled Substance Registry consulted? Not Applicable   Lutricia Feil, PA-C 03/29/18 1356

## 2018-03-29 NOTE — ED Triage Notes (Signed)
Pt with cough, runny nose and hoarse voice.

## 2018-03-29 NOTE — Discharge Instructions (Addendum)
Patient appears to have a upper respiratory infection that is likely viral.  If she develops high fevers or increasing cough along with listlessness return to our clinic or take her to the emergency room or her primary care physician.

## 2018-04-01 ENCOUNTER — Emergency Department: Payer: Medicare Other

## 2018-04-01 ENCOUNTER — Emergency Department
Admission: EM | Admit: 2018-04-01 | Discharge: 2018-04-01 | Disposition: A | Discharge status: Home / Self Care | Payer: Medicare Other | Attending: Emergency Medicine | Admitting: Emergency Medicine

## 2018-04-01 ENCOUNTER — Encounter: Payer: Self-pay | Admitting: Emergency Medicine

## 2018-04-01 ENCOUNTER — Other Ambulatory Visit: Payer: Self-pay

## 2018-04-01 DIAGNOSIS — Z7982 Long term (current) use of aspirin: Secondary | ICD-10-CM | POA: Insufficient documentation

## 2018-04-01 DIAGNOSIS — Z96659 Presence of unspecified artificial knee joint: Secondary | ICD-10-CM | POA: Diagnosis not present

## 2018-04-01 DIAGNOSIS — R05 Cough: Secondary | ICD-10-CM | POA: Diagnosis not present

## 2018-04-01 DIAGNOSIS — Z79899 Other long term (current) drug therapy: Secondary | ICD-10-CM | POA: Insufficient documentation

## 2018-04-01 DIAGNOSIS — F70 Mild intellectual disabilities: Secondary | ICD-10-CM | POA: Insufficient documentation

## 2018-04-01 DIAGNOSIS — R059 Cough, unspecified: Secondary | ICD-10-CM

## 2018-04-01 NOTE — ED Provider Notes (Signed)
[facility] Emergency Department Provider Note   ____________________________________________   I have reviewed the triage vital signs and the nursing notes.   HISTORY  Chief Complaint Cough   History limited by and level 5 caveat due to: mental retardation, history obtained from caregiver   HPI [name] is a 53 y.o. female who presents to the emergency department today from living facility because of concern for cough. The caregiver states the patient was seen three days ago for similar symptoms. The caregiver feels like the patient had been doing better today and had been closer to her baseline. Apparently though supervisors at group home were concerned that she continued to have cough. The patient is unable to give any history herself.  Caregiver states that her urine has also been darker than normal although feels this is due to the patient not drinking or eating as much the past few days.   Per medical record review patient has a history of cerebral palsy, mental retardation.   Past Medical History:  Diagnosis Date  . Acne   . Cerebral palsy (HCC)   . Mental retardation   . Myopia   . Scoliosis   . Seizure disorder [facility])     Patient Active Problem List   Diagnosis Date Noted  . Mental retardation 02/21/2015  . Seizure disorder, primary (HCC) 02/21/2015  . Menopause 02/21/2015  . Abnormal liver function tests 02/21/2015    Past Surgical History:  Procedure Laterality Date  . PARTIAL HIP ARTHROPLASTY    . REVISION TOTAL KNEE ARTHROPLASTY      Prior to Admission medications   Medication Sig Start Date End Date Taking? Authorizing Provider  aspirin 325 MG EC tablet Take 325 mg by mouth daily.    [provider]  baclofen (LIORESAL) 10 MG tablet Take 10 mg by mouth 3 (three) times daily.    [provider]  brompheniramine-pseudoephedrine-DM 30-2-10 MG/5ML syrup Take 5 mLs by mouth 4 (four) times daily as needed.  03/29/18   Lutricia Feil, PA-C  carbamazepine (TEGRETOL) 100 MG chewable tablet Chew by mouth 2 (two) times daily.    [provider]  cetirizine (ZYRTEC) 10 MG chewable tablet Chew 10 mg by mouth daily.    [provider]  cholecalciferol (VITAMIN D) 1000 units tablet Take 1,000 Units by mouth daily.    [provider]  Control Gel Formula Dressing (DUODERM CGF BORDER DRESSING EX) Apply topically.    [provider]  desonide (DESOWEN) 0.05 % ointment Apply 1 application topically 2 (two) times daily. For 5-7 days. 02/18/17   Tommie Sams, DO  dicyclomine (BENTYL) 10 MG capsule Take 10 mg by mouth 4 (four) times daily -  before meals and at bedtime.    [provider]  ENSURE PLUS (ENSURE PLUS) LIQD Take 237 mLs by mouth.    [provider]  fluticasone (FLONASE) 50 MCG/ACT nasal spray Place 2 sprays into both nostrils daily. 03/29/18   Lutricia Feil, PA-C  furosemide (LASIX) 20 MG tablet Take 20 mg by mouth.    [provider]  levothyroxine (SYNTHROID, LEVOTHROID) 100 MCG tablet Take 100 mcg by mouth daily before breakfast.    [provider]  loperamide (IMODIUM) 2 MG capsule Take by mouth as needed for diarrhea or loose stools.    [provider]  LORazepam (ATIVAN) 1 MG tablet Take 1 mg by mouth every 8 (eight) hours.    [provider]  Magnesium  Hydroxide (EQ MILK OF MAGNESIA PO) Take by mouth.    [provider]  metoCLOPramide (REGLAN) 5 MG tablet Take 5 mg by mouth 4 (four) times daily.    [provider]  Multiple Vitamin (MULTIVITAMIN) capsule Take 1 capsule by mouth daily.    [provider]  mupirocin ointment (BACTROBAN) 2 % Apply 1 application topically 2 (two) times daily. For 5-7 days. 02/18/17   Everlene Other G, DO  OLANZapine (ZYPREXA) 5 MG tablet Take 5 mg by mouth at bedtime.    [provider]  omeprazole (PRILOSEC) 20 MG capsule Take 20 mg by mouth  daily.    [provider]  potassium chloride (K-DUR,KLOR-CON) 10 MEQ tablet Take 10 mEq by mouth 2 (two) times daily.    [provider]  Prenatal Vit-Fe Psac Cmplx-FA (POLY IRON PN FORTE PO) Take by mouth.    [provider]  Skin Protectants, Misc. (EUCERIN) cream Apply topically as needed for dry skin.    [provider]  sucralfate (CARAFATE) 1 GM/10ML suspension Take 1 g by mouth 4 (four) times daily -  with meals and at bedtime.    [provider]  Sulfacetamide Sodium, Acne, (KLARON) 10 % LOTN Apply topically.    [provider]  tolnaftate (TINACTIN) 1 % spray Apply topically 2 (two) times daily.    [provider]  vitamin E 100 UNIT capsule Take by mouth daily.    [provider]    Allergies Patient has no known allergies.  Family History  Family history unknown: Yes    Social History Social History   Tobacco Use  . Smoking status: Never Smoker  . Smokeless tobacco: Never Used  Substance Use Topics  . Alcohol use: No  . Drug use: No    Review of Systems Unable to obtain secondary to mental retardation.   ____________________________________________   PHYSICAL EXAM:  VITAL SIGNS: ED Triage Vitals  Enc Vitals Group     BP 04/01/18 1008 (!) 93/58     Pulse Rate 04/01/18 1004 84     Resp 04/01/18 1004 16     Temp 04/01/18 1004 98.4 F (36.9 C)     Temp Source 04/01/18 1004 Axillary     SpO2 04/01/18 1004 96 %    Constitutional: Awake and alert. Non verbal.  Eyes: Conjunctivae are normal.  ENT      Head: Normocephalic and atraumatic.      Nose: No congestion/rhinnorhea.      Mouth/Throat: Mucous membranes are moist.      Neck: No stridor. Hematological/Lymphatic/Immunilogical: No cervical lymphadenopathy. Cardiovascular: Normal rate, regular rhythm.  No murmurs, rubs, or gallops.  Respiratory: Normal respiratory effort without tachypnea nor retractions. Breath sounds are clear and  equal bilaterally. No wheezes/rales/rhonchi. Gastrointestinal: Soft and non tender. No rebound. No guarding.  Genitourinary: Deferred Musculoskeletal: Normal range of motion in all extremities. No lower extremity edema. Neurologic:  Sequela of cerebral palsy. Awake and alert. Non verbal. Moving bilateral upper extremities.  Skin:  Skin is warm, dry and intact. No rash noted. Psychiatric: Mood and affect are normal. Speech and behavior are normal. Patient exhibits appropriate insight and judgment.  ____________________________________________    LABS (pertinent positives/negatives)  None  ____________________________________________   EKG  None  ____________________________________________    RADIOLOGY  CXR Negative for pneumonia.   ____________________________________________   PROCEDURES  Procedures  ____________________________________________   INITIAL IMPRESSION / ASSESSMENT AND PLAN / ED COURSE  Pertinent labs & imaging results  that were available during my care of the patient were reviewed by me and considered in my medical decision making (see chart for details).   Patient presented to the emergency department today because of concerns for some continued cough.  Discussed that she seems to be doing better today.  Lungs were clear on exam.  Did get x-ray given her continued symptoms which did not show any pneumonias.  There is also complains of dark urine. I discussed this with the caregiver at this point they feel comfortable deferring urine testing.  They will monitor and see if urine improves as she eats and drinks well.   ____________________________________________   FINAL CLINICAL IMPRESSION(S) / ED DIAGNOSES  Final diagnoses:  Cough     Note: This dictation was prepared with Dragon dictation. Any transcriptional errors that result from this process are unintentional     Phineas Semen, MD 04/01/18 1418

## 2018-04-01 NOTE — Discharge Instructions (Addendum)
Please seek medical attention for any high fevers, chest pain, shortness of breath, change in behavior, persistent vomiting, bloody stool or any other new or concerning symptoms.  

## 2018-04-01 NOTE — ED Triage Notes (Signed)
Pt to ED with caregiver who states that pt was seen at urgent care on Sunday for cough and fever. Fever has resolved. Caregiver states that she has not heard pt cough this morning. Pt is not eating as well as she normally does. Caregiver also states that pts urine has an odor and is dark in color. Pt is in NAD.

## 2018-08-05 ENCOUNTER — Other Ambulatory Visit: Payer: Self-pay

## 2018-08-05 ENCOUNTER — Encounter: Payer: Self-pay | Admitting: Obstetrics and Gynecology

## 2018-08-05 ENCOUNTER — Ambulatory Visit (INDEPENDENT_AMBULATORY_CARE_PROVIDER_SITE_OTHER): Payer: Medicare Other | Admitting: Obstetrics and Gynecology

## 2018-08-05 VITALS — BP 119/76 | HR 72 | Ht <= 58 in

## 2018-08-05 DIAGNOSIS — N898 Other specified noninflammatory disorders of vagina: Secondary | ICD-10-CM

## 2018-08-05 DIAGNOSIS — Z01419 Encounter for gynecological examination (general) (routine) without abnormal findings: Secondary | ICD-10-CM

## 2018-08-05 NOTE — Progress Notes (Signed)
Patient presents today for annual exam. Caregivers with her, they have noticed intermittent clear vaginal discharge "for months".  Also c/o mood swings "for a long time".

## 2018-08-05 NOTE — Progress Notes (Signed)
HPI:      [name] is a 53 y.o. G0P0000 who LMP was No LMP recorded. Patient is postmenopausal.  Subjective:   She presents today for her annual examination.   She has severe mental retardation and seizure disorder,On no hormonal medications, having no vaginal bleeding, presents from the [place] [facility] for gynecologic evaluation. Caregivers have stated that they have noted a clear vaginal discharge with seems to have increased in the last several months. Difficult to determine because patient has incontinence and wears diapers.     Hx: The following portions of the patient's history were reviewed and updated as appropriate:             She  has a past medical history of Acne, Cerebral palsy ([place]), Mental retardation, Myopia, Scoliosis, and Seizure disorder ([place]). She does not have any pertinent problems on file. She  has a past surgical history that includes Partial hip arthroplasty and Revision total knee arthroplasty. Her family history is not on file. She  reports that she has never smoked. She has never used smokeless tobacco. She reports that she does not drink alcohol or use drugs. She has a current medication list which includes the following prescription(s): aspirin, baclofen, cetirizine, cholecalciferol, dicyclomine, furosemide, levothyroxine, lorazepam, multivitamin, olanzapine, omeprazole, poly fe cmplx-fehempoly-fa-b12, potassium chloride, eucerin, sucralfate, and sulfacetamide sodium (acne). She has No Known Allergies.       Review of Systems:  Review of Systems  Constitutional: Denied constitutional symptoms, night sweats, recent illness, fatigue, fever, insomnia and weight loss.  Eyes: Denied eye symptoms, eye pain, photophobia, vision change and visual disturbance.  Ears/Nose/Throat/Neck: Denied ear, nose, throat or neck symptoms, hearing loss, nasal discharge, sinus congestion and sore throat.  Cardiovascular: Denied cardiovascular symptoms, arrhythmia,  chest pain/pressure, edema, exercise intolerance, orthopnea and palpitations.  Respiratory: Denied pulmonary symptoms, asthma, pleuritic pain, productive sputum, cough, dyspnea and wheezing.  Gastrointestinal: Denied, gastro-esophageal reflux, melena, nausea and vomiting.  Genitourinary:  Possible clear vaginal discharge as described by caregiver  Musculoskeletal: Denied musculoskeletal symptoms, stiffness, swelling, muscle weakness and myalgia.  Dermatologic: Denied dermatology symptoms, rash and scar.  Neurologic: Denied neurology symptoms, dizziness, headache, neck pain and syncope.  Psychiatric: Denied psychiatric symptoms, anxiety and depression.  Endocrine: Denied endocrine symptoms including hot flashes and night sweats.   Meds:   Current Outpatient Medications on File Prior to Visit  Medication Sig Dispense Refill  . aspirin 325 MG EC tablet Take 325 mg by mouth daily.    . baclofen (LIORESAL) 10 MG tablet Take 20 mg by mouth 3 (three) times daily.     . cetirizine (ZYRTEC) 10 MG chewable tablet Chew 10 mg by mouth as needed.     . cholecalciferol (VITAMIN D) 1000 units tablet Take 1,250 Units by mouth every other day.     . dicyclomine (BENTYL) 10 MG capsule Take 10 mg by mouth 4 (four) times daily -  before meals and at bedtime.    . furosemide (LASIX) 20 MG tablet Take 20 mg by mouth as needed.     Marland Kitchen levothyroxine (SYNTHROID, LEVOTHROID) 100 MCG tablet Take 100 mcg by mouth daily before breakfast.    . LORazepam (ATIVAN) 1 MG tablet Take 1 mg by mouth as needed.     . Multiple Vitamin (MULTIVITAMIN) capsule Take 1 capsule by mouth daily.    Marland Kitchen OLANZapine (ZYPREXA) 5 MG tablet Take 5 mg by mouth as needed.     Marland Kitchen omeprazole (PRILOSEC) 20 MG capsule Take  20 mg by mouth 2 (two) times daily.     . Poly Fe Cmplx-FeHemPoly-FA-B12 22-6-1-0.025 MG TABS Take 1 tablet by mouth.    . potassium chloride (K-DUR,KLOR-CON) 10 MEQ tablet Take 10 mEq by mouth 2 (two) times daily.    . Skin  Protectants, Misc. (EUCERIN) cream Apply topically as needed for dry skin.    Marland Kitchen. sucralfate (CARAFATE) 1 GM/10ML suspension Take 1 g by mouth 4 (four) times daily -  with meals and at bedtime.    . Sulfacetamide Sodium, Acne, (KLARON) 10 % LOTN Apply topically 2 (two) times daily.      No current facility-administered medications on file prior to visit.     Objective:     Vitals:   08/05/18 1058  BP: 119/76  Pulse: 72              Physical examination General  severe mental retardation  HEENT Atraumatic; Op clear with mmm.  Normo-cephalic. Pupils reactive. Anicteric sclerae  Thyroid/Neck Smooth without nodularity or enlargement. Normal ROM.  Neck Supple.  Skin No rashes, lesions or ulceration. Normal palpated skin turgor. No nodularity.  Breasts: No masses or discharge.  Symmetric.  No axillary adenopathy.  Lungs: Clear to auscultation.No rales or wheezes. Normal Respiratory effort, no retractions.  Heart: NSR.  No murmurs or rubs appreciated. No periferal edema  Abdomen: Soft.  Non-tender.  No masses.  No HSM. No hernia  Extremities:  Contractures and does not move appropriately  Neuro:  Unable     Pelvic:   Vulva: Normal appearance.  No lesions.  Vagina: No lesions or abnormalities noted.  Support: Normal pelvic support.  Urethra No masses tenderness or scarring.  Meatus Normal size without lesions or prolapse.  Cervix: Normal appearance.  No lesions.  Anus: Normal exam.  No lesions.  Perineum: Normal exam.  No lesions.        Bimanual  unable  Uterus:   Adnexae:   Cul-de-sac:    WET PREP: clue cells: absent, KOH (yeast): negative, odor: absent and trichomoniasis: negative Ph:  < 4.5    Assessment:    G0P0000 Patient Active Problem List   Diagnosis Date Noted  . Mental retardation 02/21/2015  . Seizure disorder, primary (HCC) 02/21/2015  . Menopause 02/21/2015  . Abnormal liver function tests 02/21/2015     1. Well woman exam with routine gynecological exam      No evidence of vaginitis based on wet prep and appearance of vulva   Plan:            1.  Continue current care  2.  Patient had a mammogram 2 weeks ago which was normal  3.  I do not believe estrogen is appropriate for mood changes in this sedentary patient her risk of DVT is too high.  Consider Zoloft or other antidepressant medication.    Orders No orders of the defined types were placed in this encounter.   No orders of the defined types were placed in this encounter.       F/U  Return in about 1 year (around 08/05/2019) for Annual Physical.  Elonda Huskyavid J. Evans, M.D. 08/05/2018 11:40 AM

## 2019-02-03 ENCOUNTER — Other Ambulatory Visit: Payer: Self-pay | Admitting: Physician Assistant

## 2019-02-03 DIAGNOSIS — R7989 Other specified abnormal findings of blood chemistry: Secondary | ICD-10-CM

## 2019-02-03 DIAGNOSIS — R634 Abnormal weight loss: Secondary | ICD-10-CM

## 2019-02-03 DIAGNOSIS — R630 Anorexia: Secondary | ICD-10-CM

## 2019-02-04 ENCOUNTER — Ambulatory Visit
Admission: RE | Admit: 2019-02-04 | Discharge: 2019-02-04 | Disposition: A | Discharge status: Home / Self Care | Payer: Medicare Other | Source: Ambulatory Visit | Attending: Physician Assistant | Admitting: Physician Assistant

## 2019-02-04 ENCOUNTER — Other Ambulatory Visit: Payer: Self-pay

## 2019-02-04 DIAGNOSIS — R630 Anorexia: Secondary | ICD-10-CM | POA: Insufficient documentation

## 2019-02-04 DIAGNOSIS — R7989 Other specified abnormal findings of blood chemistry: Secondary | ICD-10-CM | POA: Diagnosis present

## 2019-02-04 DIAGNOSIS — R634 Abnormal weight loss: Secondary | ICD-10-CM | POA: Diagnosis present

## 2019-02-26 ENCOUNTER — Emergency Department
Admission: EM | Admit: 2019-02-26 | Discharge: 2019-02-27 | Payer: Medicare Other | Attending: Emergency Medicine | Admitting: Emergency Medicine

## 2019-02-26 ENCOUNTER — Encounter: Payer: Self-pay | Admitting: *Deleted

## 2019-02-26 ENCOUNTER — Other Ambulatory Visit: Payer: Self-pay

## 2019-02-26 DIAGNOSIS — R4182 Altered mental status, unspecified: Secondary | ICD-10-CM | POA: Diagnosis not present

## 2019-02-26 DIAGNOSIS — N3000 Acute cystitis without hematuria: Secondary | ICD-10-CM

## 2019-02-26 DIAGNOSIS — Z79899 Other long term (current) drug therapy: Secondary | ICD-10-CM | POA: Insufficient documentation

## 2019-02-26 DIAGNOSIS — G809 Cerebral palsy, unspecified: Secondary | ICD-10-CM | POA: Insufficient documentation

## 2019-02-26 DIAGNOSIS — R451 Restlessness and agitation: Secondary | ICD-10-CM | POA: Diagnosis present

## 2019-02-26 DIAGNOSIS — Z7982 Long term (current) use of aspirin: Secondary | ICD-10-CM | POA: Insufficient documentation

## 2019-02-26 LAB — URINALYSIS, COMPLETE (UACMP) WITH MICROSCOPIC
Bilirubin Urine: NEGATIVE
Glucose, UA: NEGATIVE mg/dL
Ketones, ur: 20 mg/dL — AB
Nitrite: POSITIVE — AB
Protein, ur: 100 mg/dL — AB
RBC / HPF: >50 RBC/hpf — ABNORMAL HIGH (ref 0–5)
Specific Gravity, Urine: 1.02 (ref 1.005–1.030)
Squamous Epithelial / LPF: NONE SEEN (ref 0–5)
WBC, UA: >50 WBC/hpf — ABNORMAL HIGH (ref 0–5)
pH: 6 (ref 5.0–8.0)

## 2019-02-26 LAB — CBC WITH DIFFERENTIAL/PLATELET
Abs Immature Granulocytes: 0.05 10*3/uL (ref 0.00–0.07)
Basophils Absolute: 0 10*3/uL (ref 0.0–0.1)
Basophils Relative: 0 %
Eosinophils Absolute: 0 10*3/uL (ref 0.0–0.5)
Eosinophils Relative: 0 %
HCT: 40 % (ref 36.0–46.0)
Hemoglobin: 12.5 g/dL (ref 12.0–15.0)
Immature Granulocytes: 1 %
Lymphocytes Relative: 6 %
Lymphs Abs: 0.7 10*3/uL (ref 0.7–4.0)
MCH: 29.4 pg (ref 26.0–34.0)
MCHC: 31.3 g/dL (ref 30.0–36.0)
MCV: 94.1 fL (ref 80.0–100.0)
Monocytes Absolute: 0.7 10*3/uL (ref 0.1–1.0)
Monocytes Relative: 7 %
Neutro Abs: 9.1 10*3/uL — ABNORMAL HIGH (ref 1.7–7.7)
Neutrophils Relative %: 86 %
Platelets: 288 10*3/uL (ref 150–400)
RBC: 4.25 MIL/uL (ref 3.87–5.11)
RDW: 15 % (ref 11.5–15.5)
WBC: 10.6 10*3/uL — ABNORMAL HIGH (ref 4.0–10.5)
nRBC: 0 % (ref 0.0–0.2)

## 2019-02-26 LAB — COMPREHENSIVE METABOLIC PANEL
ALT: 31 U/L (ref 0–44)
AST: 41 U/L (ref 15–41)
Albumin: 2.9 g/dL — ABNORMAL LOW (ref 3.5–5.0)
Alkaline Phosphatase: 119 U/L (ref 38–126)
Anion gap: 12 (ref 5–15)
BUN: 33 mg/dL — ABNORMAL HIGH (ref 6–20)
CO2: 25 mmol/L (ref 22–32)
Calcium: 9 mg/dL (ref 8.9–10.3)
Chloride: 108 mmol/L (ref 98–111)
Creatinine, Ser: 0.68 mg/dL (ref 0.44–1.00)
GFR calc Af Amer: >60 mL/min (ref >60)
GFR calc non Af Amer: >60 mL/min (ref >60)
Glucose, Bld: 130 mg/dL — ABNORMAL HIGH (ref 70–99)
Potassium: 4.5 mmol/L (ref 3.5–5.1)
Sodium: 145 mmol/L (ref 135–145)
Total Bilirubin: 1.3 mg/dL — ABNORMAL HIGH (ref 0.3–1.2)
Total Protein: 7.3 g/dL (ref 6.5–8.1)

## 2019-02-26 MED ORDER — CEPHALEXIN 250 MG/5ML PO SUSR: 500.0000 mg | Freq: Two times a day (BID) | ORAL | 0 refills | Status: AC

## 2019-02-26 MED ORDER — LACTATED RINGERS IV BOLUS: 1000.0000 mL | Freq: Once | INTRAVENOUS | Status: DC

## 2019-02-26 MED ORDER — MORPHINE SULFATE (PF) 4 MG/ML IV SOLN
4.0000 mg | Freq: Once | INTRAVENOUS | Status: AC
  Administered 2019-02-26: 22:00:00 4 mg via INTRAVENOUS
  Filled 2019-02-26: qty 1

## 2019-02-26 MED ORDER — SODIUM CHLORIDE 0.9 % IV SOLN
1.0000 g | Freq: Once | INTRAVENOUS | Status: AC
  Administered 2019-02-26: 22:00:00 1 g via INTRAVENOUS
  Filled 2019-02-26: qty 10

## 2019-02-26 NOTE — ED Provider Notes (Signed)
[facility] Emergency Department Provider Note   ____________________________________________   First MD Initiated Contact with Patient 02/26/19 2046     (approximate)  I have reviewed the triage vital signs and the nursing notes.   HISTORY  Chief Complaint Agitation   HPI [name] is a 54 y.o. female with past medical history of cerebral palsy and MR who presents to the ED for agitation.  History is limited due to patient's baseline cognitive deficits.  Per EMS, staff at her group home noticed that she was yelling out and seemed to be more uncomfortable than usual.  She has not had any fevers, cough, or seem to have any difficulty breathing.  Her mental status has seemed to be at her baseline other than occasionally yelling out.  Staff was unable to localize any specific area of pain.        Past Medical History:  Diagnosis Date  . Acne   . Cerebral palsy (HCC)   . Mental retardation   . Myopia   . Scoliosis   . Seizure disorder [facility])     Patient Active Problem List   Diagnosis Date Noted  . Mental retardation 02/21/2015  . Seizure disorder, primary (HCC) 02/21/2015  . Menopause 02/21/2015  . Abnormal liver function tests 02/21/2015    Past Surgical History:  Procedure Laterality Date  . PARTIAL HIP ARTHROPLASTY    . REVISION TOTAL KNEE ARTHROPLASTY      Prior to Admission medications   Medication Sig Start Date End Date Taking? Authorizing Provider  aspirin 325 MG EC tablet Take 325 mg by mouth daily.    [provider]  azelastine (OPTIVAR) 0.05 % ophthalmic solution Place 1 drop into both eyes 2 (two) times daily.    [provider]  baclofen (LIORESAL) 10 MG tablet Take 20 mg by mouth 3 (three) times daily.     [provider]  carbamazepine (TEGRETOL XR) 200 MG 12 hr tablet Take 200 mg by mouth 2 (two) times daily.    [provider]  cephALEXin (KEFLEX) 250 MG/5ML suspension Take 10 mLs  (500 mg total) by mouth 2 (two) times daily for 7 days. 02/26/19 03/05/19  Chesley Noon, MD  cetirizine (ZYRTEC) 10 MG chewable tablet Chew 10 mg by mouth as needed.     [provider]  cholecalciferol (VITAMIN D) 1000 units tablet Take 1,250 Units by mouth every other day.     [provider]  dicyclomine (BENTYL) 10 MG capsule Take 10 mg by mouth 4 (four) times daily -  before meals and at bedtime.    [provider]  furosemide (LASIX) 20 MG tablet Take 20 mg by mouth as needed.     [provider]  levothyroxine (SYNTHROID, LEVOTHROID) 100 MCG tablet Take 100 mcg by mouth daily before breakfast.    [provider]  LORazepam (ATIVAN) 1 MG tablet Take 1 mg by mouth as needed.     [provider]  metoCLOPramide (REGLAN) 5 MG tablet Take 5 mg by mouth 4 (four) times daily.    [provider]  Multiple Vitamin (MULTIVITAMIN) capsule Take 1 capsule by mouth daily.    [provider]  OLANZapine (ZYPREXA) 5 MG tablet Take 5 mg by mouth as needed.     [provider]  omeprazole (PRILOSEC) 20 MG capsule Take 20 mg by mouth 2 (two) times daily.     [provider]  Poly Fe Cmplx-FeHemPoly-FA-B12 22-6-1-0.025 MG  TABS Take 1 tablet by mouth.    [provider]  POLYSACCH FE CMP-FE HEME POLY PO Take 150 mg by mouth daily.    [provider]  potassium chloride (K-DUR,KLOR-CON) 10 MEQ tablet Take 10 mEq by mouth 2 (two) times daily.    [provider]  Skin Protectants, Misc. (EUCERIN) cream Apply topically as needed for dry skin.    [provider]  sucralfate (CARAFATE) 1 GM/10ML suspension Take 1 g by mouth 4 (four) times daily -  with meals and at bedtime.    [provider]  Sulfacetamide Sodium, Acne, (KLARON) 10 % LOTN Apply topically 2 (two) times daily.     [provider]    Allergies Patient has no known allergies.  Family History  Problem  Relation Age of Onset  . Breast cancer Neg Hx   . Ovarian cancer Neg Hx   . Colon cancer Neg Hx     Social History Social History   Tobacco Use  . Smoking status: Never Smoker  . Smokeless tobacco: Never Used  Substance Use Topics  . Alcohol use: No  . Drug use: No    Review of Systems Unable to obtain secondary to cognitive deficits  ____________________________________________   PHYSICAL EXAM:  VITAL SIGNS: ED Triage Vitals  Enc Vitals Group     BP      Pulse      Resp      Temp      Temp src      SpO2      Weight      Height      Head Circumference      Peak Flow      Pain Score      Pain Loc      Pain Edu?      Excl. in GC?     Constitutional: Awake and alert, occasionally yelling out Eyes: Conjunctivae are normal. Head: Atraumatic. Nose: No congestion/rhinnorhea. Mouth/Throat: Mucous membranes are dry. Neck: Normal ROM Cardiovascular: Tachycardic, regular rhythm. Grossly normal heart sounds. Respiratory: Normal respiratory effort.  No retractions. Lungs CTAB. Gastrointestinal: Soft and nontender. No distention. Genitourinary: deferred Musculoskeletal: No lower extremity tenderness nor edema. Neurologic: No gross focal neurologic deficits are appreciated. Skin:  Skin is warm, dry and intact. No rash noted.  ____________________________________________   LABS (all labs ordered are listed, but only abnormal results are displayed)  Labs Reviewed  URINALYSIS, COMPLETE (UACMP) WITH MICROSCOPIC - Abnormal; Notable for the following components:      Result Value   Color, Urine AMBER (*)    APPearance CLOUDY (*)    Hgb urine dipstick MODERATE (*)    Ketones, ur 20 (*)    Protein, ur 100 (*)    Nitrite POSITIVE (*)    Leukocytes,Ua LARGE (*)    RBC / HPF >50 (*)    WBC, UA >50 (*)    Bacteria, UA MANY (*)    All other components within normal limits  CBC WITH DIFFERENTIAL/PLATELET - Abnormal; Notable for the following components:   WBC 10.6 (*)     Neutro Abs 9.1 (*)    All other components within normal limits  COMPREHENSIVE METABOLIC PANEL - Abnormal; Notable for the following components:   Glucose, Bld 130 (*)    BUN 33 (*)    Albumin 2.9 (*)    Total Bilirubin 1.3 (*)    All other components within normal limits  URINE CULTURE   ____________________________________________  EKG ED  ECG REPORT I, Blake Divine, the attending physician, personally viewed and interpreted this ECG.   Date: 02/26/2019  EKG Time: 20:56  Rate: 110  Rhythm: sinus tachycardia  Axis: Normal  Intervals:none  ST&T Change: None   PROCEDURES  Procedure(s) performed (including Critical Care):  Procedures   ____________________________________________   INITIAL IMPRESSION / ASSESSMENT AND PLAN / ED COURSE       54 year old female with history of CP and MR presents to the ED for increased agitation and occasionally yelling out, which is a departure from her baseline.  She has no obvious areas of discomfort, abdominal exam is reassuring.  She also has no focal neurologic deficits.  UA consistent with UTI, remainder of labs are reassuring.  Will give additional dose of IV antibiotics and hydrate with IV fluids, also treat apparent pain and reassess.  Vital signs improved following dose of pain medication and IV fluids, tachycardia and tachypnea have resolved, patient appears much more comfortable and is no longer yelling out.  I doubt sepsis and she is appropriate for course of outpatient antibiotics.  She will be discharged home to the care of her group home staff with plan to follow-up with PCP and otherwise return to the ED for new or worsening symptoms.      ____________________________________________   FINAL CLINICAL IMPRESSION(S) / ED DIAGNOSES  Final diagnoses:  Acute cystitis without hematuria  Altered mental status, unspecified altered mental status type  Cerebral palsy, unspecified type [facility])     ED Discharge Orders          Ordered    cephALEXin (KEFLEX) 250 MG/5ML suspension  2 times daily     02/26/19 2338           Note:  This document was prepared using Dragon voice recognition software and may include unintentional dictation errors.   Blake Divine, MD 02/26/19 2351

## 2019-02-26 NOTE — ED Notes (Signed)
See triage note, patient yelling occasionally as though in pain with this nurse. Pt alert but developmentally unable to answer questions.

## 2019-02-26 NOTE — ED Notes (Signed)
Patient resting quietly at this time, no complaints evident. Brief dry, patient is not crying out

## 2019-02-26 NOTE — ED Notes (Signed)
Spoke with Neldon Labella at the group home, notified her that patient will be returning home this evening after being treated for a UTI. Informed her that a prescription will be sent with patient for an antibiotic for UTI

## 2019-02-26 NOTE — ED Notes (Signed)
[facility] to transport home. 

## 2019-02-26 NOTE — ED Triage Notes (Signed)
Pt arrival via [facility] from group home.  EMS states that they don't really know whats going on but that the group home called them because she is shouting out and that she does not usually have this yelling at baseline.   Pt has mits on her because she likes to bite herself/hands. Pt is confused and unable to communicate at baseline. [facility] states that pulse was 90 with them but was 96 with EMS  Dr. Larinda Buttery at bedside.

## 2019-02-27 MED ORDER — ACETAMINOPHEN 650 MG RE SUPP
650.0000 mg | Freq: Once | RECTAL | Status: AC
  Administered 2019-02-27: 650 mg via RECTAL

## 2019-02-27 NOTE — ED Notes (Signed)
EMS picked up patient, given tylenol suppository for low grade temp. Neldon Labella aware that patient is en route and received tylenol

## 2019-02-28 LAB — URINE CULTURE

## 2019-07-16 ENCOUNTER — Other Ambulatory Visit: Payer: Self-pay

## 2019-07-16 ENCOUNTER — Ambulatory Visit
Admission: EM | Admit: 2019-07-16 | Discharge: 2019-07-16 | Disposition: A | Discharge status: Home / Self Care | Payer: Medicare Other | Attending: Family Medicine | Admitting: Family Medicine

## 2019-07-16 DIAGNOSIS — R197 Diarrhea, unspecified: Secondary | ICD-10-CM

## 2019-07-16 NOTE — ED Triage Notes (Signed)
Pt nonverbal and wheelchair , from group home [facility].  Has a caretaker at bedside.  Caretaker reports pt having diarrhea x 2 days, 2 episodes per day.  States diarrhea has bad odor.  Unsure what the diarrhea looked like.  Pt unable to indicate pain or discomfort.  Caretaker states pt had a 'blood virus' and cdiff approx 4 mos ago.  States they think she had a UTI that turned septic but never had urine culture.  Want to make sure she doesn't have that infection or cdiff again.  Caretaker states no change in appetite or fluid intake.  No vomiting.  Pt may possibly not be making as many vocalizations.   Caretaker unable to confirm other medical or surgical history.

## 2019-07-16 NOTE — ED Provider Notes (Signed)
[facility]    CSN: [id number] Arrival date & time: 07/16/19  0941      History   Chief Complaint Chief Complaint  Patient presents with  . Diarrhea    HPI   54 year old female who has MR and seizure disorder and is nonverbal presents with diarrhea.  She resides in a group home.  She is accompanied by caregiver today.  Caregiver states that she has had foul-smelling diarrhea for the past 2 days.  She has had a previous history of C. difficile which prompted them to bring her in today.  No reported urinary symptoms.  No fever.  No change in appetite.  No vomiting.  No other reported symptoms.  No other complaints or concerns at this time.  Past Medical History:  Diagnosis Date  . Acne   . Cerebral palsy (HCC)   . Mental retardation   . Myopia   . Scoliosis   . Seizure disorder [facility])     Patient Active Problem List   Diagnosis Date Noted  . Mental retardation 02/21/2015  . Seizure disorder, primary (HCC) 02/21/2015  . Menopause 02/21/2015  . Abnormal liver function tests 02/21/2015    Past Surgical History:  Procedure Laterality Date  . PARTIAL HIP ARTHROPLASTY    . REVISION TOTAL KNEE ARTHROPLASTY      OB History    Gravida  0   Para  0   Term  0   Preterm  0   AB  0   Living  0     SAB  0   TAB  0   Ectopic  0   Multiple  0   Live Births               Home Medications    Prior to Admission medications   Medication Sig Start Date End Date Taking? Authorizing Provider  aspirin 325 MG EC tablet Take 325 mg by mouth daily.   Yes [provider]  azelastine (OPTIVAR) 0.05 % ophthalmic solution Place 1 drop into both eyes 2 (two) times daily.   Yes [provider]  baclofen (LIORESAL) 10 MG tablet Take 20 mg by mouth 3 (three) times daily.    Yes [provider]  carbamazepine (TEGRETOL XR) 200 MG 12 hr tablet Take 100 mg by mouth 2 (two) times daily.    Yes [provider]  cetirizine  (ZYRTEC) 10 MG chewable tablet Chew 10 mg by mouth as needed.    Yes [provider]  cholecalciferol (VITAMIN D) 1000 units tablet Take 1,250 Units by mouth every other day.    Yes [provider]  dicyclomine (BENTYL) 10 MG capsule Take 10 mg by mouth 4 (four) times daily -  before meals and at bedtime.   Yes [provider]  furosemide (LASIX) 20 MG tablet Take 20 mg by mouth as needed.    Yes [provider]  levothyroxine (SYNTHROID, LEVOTHROID) 100 MCG tablet Take 100 mcg by mouth daily before breakfast.   Yes [provider]  LORazepam (ATIVAN) 1 MG tablet Take 1 mg by mouth as needed.    Yes [provider]  metoCLOPramide (REGLAN) 5 MG tablet Take 5 mg by mouth 4 (four) times daily.   Yes [provider]  mirtazapine (REMERON) 7.5 MG tablet Take 7.5 mg by mouth at bedtime.   Yes [provider]  Multiple Vitamin (MULTIVITAMIN) capsule Take 1 capsule by mouth daily.   Yes [provider]  omeprazole (PRILOSEC) 20 MG capsule Take 20 mg by mouth 2 (two) times daily.    Yes [provider]  sucralfate (CARAFATE) 1 GM/10ML suspension Take 1 g by mouth 4 (four) times daily -  with meals and at bedtime.   Yes [provider]  Sulfacetamide Sodium, Acne, (KLARON) 10 % LOTN Apply topically 2 (two) times daily.    Yes [provider]  UNABLE TO FIND Med Name: CBD Oil 500mg /45mL.  1 dropper under tongue BID.   Yes [provider]  OLANZapine (ZYPREXA) 5 MG tablet Take 5 mg by mouth as needed.     [provider]  Poly Fe Cmplx-FeHemPoly-FA-B12 22-6-1-0.025 MG TABS Take 1 tablet by mouth.    [provider]  POLYSACCH FE CMP-FE HEME POLY PO Take 150 mg by mouth daily.    [provider]  potassium chloride (K-DUR,KLOR-CON) 10 MEQ tablet Take 10 mEq by mouth 2 (two) times daily.    [provider]  Skin Protectants, Misc. (EUCERIN) cream Apply topically  as needed for dry skin.    [provider]    Family History Family History  Problem Relation Age of Onset  . Breast cancer Neg Hx   . Ovarian cancer Neg Hx   . Colon cancer Neg Hx     Social History Social History   Tobacco Use  . Smoking status: Never Smoker  . Smokeless tobacco: Never Used  Vaping Use  . Vaping Use: Never used  Substance Use Topics  . Alcohol use: No  . Drug use: No     Allergies   Amantadines   Review of Systems Review of Systems Per cargiver - Diarrhea - No fever, nausea, or vomiting.  Physical Exam Triage Vital Signs ED Triage Vitals [07/16/19 1038]  Enc Vitals Group     BP 121/71     Pulse Rate 70     Resp 16     Temp (!) 97.4 F (36.3 C)     Temp Source Temporal     SpO2 100 %     Weight      Height      Head Circumference      Peak Flow      Pain Score      Pain Loc      Pain Edu?      Excl. in GC?    Updated Vital Signs BP 121/71 (BP Location: Left Arm)   Pulse 70   Temp (!) 97.4 F (36.3 C) (Temporal)   Resp 16   SpO2 100%   Visual Acuity Right Eye Distance:   Left Eye Distance:   Bilateral Distance:    Right Eye Near:   Left Eye Near:    Bilateral Near:     Physical Exam Vitals and nursing note reviewed.  Constitutional:      General: She is not in acute distress. HENT:     Head: Normocephalic and atraumatic.  Eyes:     General:        Right eye: No discharge.        Left eye: No discharge.     Conjunctiva/sclera: Conjunctivae normal.  Cardiovascular:     Rate and Rhythm: Normal rate and regular rhythm.  Pulmonary:     Effort: Pulmonary effort is normal.     Breath sounds: No wheezing, rhonchi or rales.  Abdominal:     General: There is no distension.     Palpations: Abdomen is soft.  Neurological:  Mental Status: She is alert.    UC Treatments / Results  Labs (all labs ordered are listed, but only abnormal results are displayed) Labs Reviewed - No data to  display  EKG   Radiology No results found.  Procedures Procedures (including critical care time)  Medications Ordered in UC Medications - No data to display  Initial Impression / Assessment and Plan / UC Course  I have reviewed the triage vital signs and the nursing notes.  Pertinent labs & imaging results that were available during my care of the patient were reviewed by me and considered in my medical decision making (see chart for details).    54 year old female with a history of C. difficile presents with diarrhea.  She appears to be at her baseline.  Orders placed for stool testing.  Caregiver will return with stool sample.  Awaiting sample and subsequent result.  Final Clinical Impressions(s) / UC Diagnoses   Final diagnoses:  Diarrhea, unspecified type     Discharge Instructions     Return with stool sample.  We will call with results.  Take care  Dr. Lacinda Axon    ED Prescriptions    None     PDMP not reviewed this encounter.   Coral Spikes, DO 07/16/19 1230

## 2019-07-16 NOTE — Discharge Instructions (Signed)
Return with stool sample.  We will call with results.  Take care  Dr. Adriana Simas

## 2019-08-06 ENCOUNTER — Encounter: Payer: Medicare Other | Admitting: Obstetrics and Gynecology

## 2019-08-24 ENCOUNTER — Ambulatory Visit (INDEPENDENT_AMBULATORY_CARE_PROVIDER_SITE_OTHER): Payer: Medicare Other | Admitting: Obstetrics and Gynecology

## 2019-08-24 ENCOUNTER — Encounter: Payer: Self-pay | Admitting: Obstetrics and Gynecology

## 2019-08-24 VITALS — BP 118/78 | HR 70

## 2019-08-24 DIAGNOSIS — Z0000 Encounter for general adult medical examination without abnormal findings: Secondary | ICD-10-CM

## 2019-08-24 DIAGNOSIS — Z01419 Encounter for gynecological examination (general) (routine) without abnormal findings: Secondary | ICD-10-CM

## 2019-08-24 DIAGNOSIS — F79 Unspecified intellectual disabilities: Secondary | ICD-10-CM | POA: Diagnosis not present

## 2019-08-24 NOTE — Progress Notes (Signed)
HPI:      [name] is a 54 y.o. G0P0000 who LMP was No LMP recorded. Patient is postmenopausal.  Subjective:   She presents today for her annual examination.  She is severely mentally challenged and presents today with her caregiver.  Caregiver states that there are no GYN issues.  Patient is menopausal without bleeding. She wears a diaper continuously. Patient recently had a normal mammogram. She is not sexually active.  She does not allow pelvic examinations.    Hx: The following portions of the patient's history were reviewed and updated as appropriate:             She  has a past medical history of Acne, Cerebral palsy (HCC), Mental retardation, Myopia, Scoliosis, and Seizure disorder (HCC). She does not have any pertinent problems on file. She  has a past surgical history that includes Partial hip arthroplasty and Revision total knee arthroplasty. Her family history is not on file. She  reports that she has never smoked. She has never used smokeless tobacco. She reports that she does not drink alcohol and does not use drugs. She has a current medication list which includes the following prescription(s): aspirin, azelastine, baclofen, carbamazepine, cetirizine, cholecalciferol, dicyclomine, furosemide, levothyroxine, lorazepam, metoclopramide, mirtazapine, multivitamin, olanzapine, omeprazole, poly fe cmplx-fehempoly-fa-b12, polysacch fe cmp-fe heme poly, potassium chloride, eucerin, sucralfate, sulfacetamide sodium (acne), and UNABLE TO FIND. She is allergic to amantadines.       Review of Systems:  Review of Systems  Constitutional: Denied constitutional symptoms, night sweats, recent illness, fatigue, fever, insomnia and weight loss.  Eyes: Denied eye symptoms, eye pain, photophobia, vision change and visual disturbance.  Ears/Nose/Throat/Neck: Denied ear, nose, throat or neck symptoms, hearing loss, nasal discharge, sinus congestion and sore throat.  Cardiovascular:  Denied cardiovascular symptoms, arrhythmia, chest pain/pressure, edema, exercise intolerance, orthopnea and palpitations.  Respiratory: Denied pulmonary symptoms, asthma, pleuritic pain, productive sputum, cough, dyspnea and wheezing.  Gastrointestinal: Denied, gastro-esophageal reflux, melena, nausea and vomiting.  Genitourinary: Denied genitourinary symptoms including symptomatic vaginal discharge, pelvic relaxation issues, and urinary complaints.  Musculoskeletal: Denied musculoskeletal symptoms, stiffness, swelling, muscle weakness and myalgia.  Dermatologic: Denied dermatology symptoms, rash and scar.  Neurologic: Denied neurology symptoms, dizziness, headache, neck pain and syncope.  Psychiatric: Denied psychiatric symptoms, anxiety and depression.  Endocrine: Denied endocrine symptoms including hot flashes and night sweats.   Meds:   Current Outpatient Medications on File Prior to Visit  Medication Sig Dispense Refill  . aspirin 325 MG EC tablet Take 325 mg by mouth daily.    Marland Kitchen azelastine (OPTIVAR) 0.05 % ophthalmic solution Place 1 drop into both eyes 2 (two) times daily.    . baclofen (LIORESAL) 10 MG tablet Take 20 mg by mouth 3 (three) times daily.     . carbamazepine (TEGRETOL XR) 200 MG 12 hr tablet Take 100 mg by mouth 2 (two) times daily.     . cetirizine (ZYRTEC) 10 MG chewable tablet Chew 10 mg by mouth as needed.     . cholecalciferol (VITAMIN D) 1000 units tablet Take 1,250 Units by mouth every other day.     . dicyclomine (BENTYL) 10 MG capsule Take 10 mg by mouth 4 (four) times daily -  before meals and at bedtime.    . furosemide (LASIX) 20 MG tablet Take 20 mg by mouth as needed.     Marland Kitchen levothyroxine (SYNTHROID, LEVOTHROID) 100 MCG tablet Take 100 mcg by mouth daily before breakfast.    . LORazepam (ATIVAN)  1 MG tablet Take 1 mg by mouth as needed.     . metoCLOPramide (REGLAN) 5 MG tablet Take 5 mg by mouth 4 (four) times daily.    . mirtazapine (REMERON) 7.5 MG tablet  Take 7.5 mg by mouth at bedtime.    . Multiple Vitamin (MULTIVITAMIN) capsule Take 1 capsule by mouth daily.    Marland Kitchen OLANZapine (ZYPREXA) 5 MG tablet Take 5 mg by mouth as needed.     Marland Kitchen omeprazole (PRILOSEC) 20 MG capsule Take 20 mg by mouth 2 (two) times daily.     . Poly Fe Cmplx-FeHemPoly-FA-B12 22-6-1-0.025 MG TABS Take 1 tablet by mouth.    Marland Kitchen POLYSACCH FE CMP-FE HEME POLY PO Take 150 mg by mouth daily.    . potassium chloride (K-DUR,KLOR-CON) 10 MEQ tablet Take 10 mEq by mouth 2 (two) times daily.    . Skin Protectants, Misc. (EUCERIN) cream Apply topically as needed for dry skin.    Marland Kitchen sucralfate (CARAFATE) 1 GM/10ML suspension Take 1 g by mouth 4 (four) times daily -  with meals and at bedtime.    . Sulfacetamide Sodium, Acne, (KLARON) 10 % LOTN Apply topically 2 (two) times daily.     Marland Kitchen UNABLE TO FIND Med Name: CBD Oil 500mg /41mL.  1 dropper under tongue BID.     No current facility-administered medications on file prior to visit.    Objective:     Vitals:   08/24/19 1101  BP: 118/78  Pulse: 70              Physical examination   Pelvic:   Vulva: Normal appearance.  No lesions.  Vagina: No lesions or abnormalities noted.  Support: Normal pelvic support.  Urethra No masses tenderness or scarring.  Meatus Normal size without lesions or prolapse.  Cervix: Normal appearance.  No lesions.  Anus: Normal exam.  No lesions.  Perineum: Normal exam.  No lesions.        Bimanual  unable    External examination without rash or lesions.  Assessment:    G0P0000 Patient Active Problem List   Diagnosis Date Noted  . Mental retardation 02/21/2015  . Seizure disorder, primary (HCC) 02/21/2015  . Menopause 02/21/2015  . Abnormal liver function tests 02/21/2015     1. Well woman exam with routine gynecological exam   2. Mentally challenged     Without GYN complaints.  Care obviously good as patient has no evidence of rash or pelvic lesions despite constant diapering.   Plan:             1.  Continue yearly mammograms.  Patient does not need Pap smears because she is not sexually active. Orders No orders of the defined types were placed in this encounter.   No orders of the defined types were placed in this encounter.       F/U  Return in about 1 year (around 08/23/2020) for Annual Physical.  10/23/2020, M.D. 08/24/2019 11:29 AM

## 2019-11-09 ENCOUNTER — Encounter: Payer: Self-pay | Admitting: Emergency Medicine

## 2019-11-09 ENCOUNTER — Other Ambulatory Visit: Payer: Self-pay

## 2019-11-09 ENCOUNTER — Ambulatory Visit (INDEPENDENT_AMBULATORY_CARE_PROVIDER_SITE_OTHER)
Admission: EM | Admit: 2019-11-09 | Discharge: 2019-11-09 | Disposition: A | Discharge status: Other Acute Inpatient Hospital | Payer: Medicare Other | Source: Home / Self Care

## 2019-11-09 ENCOUNTER — Emergency Department
Admission: EM | Admit: 2019-11-09 | Discharge: 2019-11-09 | Disposition: A | Discharge status: Home / Self Care | Payer: Medicare Other | Attending: Emergency Medicine | Admitting: Emergency Medicine

## 2019-11-09 DIAGNOSIS — L089 Local infection of the skin and subcutaneous tissue, unspecified: Secondary | ICD-10-CM | POA: Insufficient documentation

## 2019-11-09 DIAGNOSIS — Z7982 Long term (current) use of aspirin: Secondary | ICD-10-CM | POA: Diagnosis not present

## 2019-11-09 DIAGNOSIS — L89119 Pressure ulcer of right upper back, unspecified stage: Secondary | ICD-10-CM | POA: Insufficient documentation

## 2019-11-09 DIAGNOSIS — Z79899 Other long term (current) drug therapy: Secondary | ICD-10-CM | POA: Diagnosis not present

## 2019-11-09 DIAGNOSIS — L89110 Pressure ulcer of right upper back, unstageable: Secondary | ICD-10-CM

## 2019-11-09 DIAGNOSIS — Z96653 Presence of artificial knee joint, bilateral: Secondary | ICD-10-CM | POA: Insufficient documentation

## 2019-11-09 DIAGNOSIS — T148XXA Other injury of unspecified body region, initial encounter: Secondary | ICD-10-CM | POA: Insufficient documentation

## 2019-11-09 DIAGNOSIS — L89149 Pressure ulcer of left lower back, unspecified stage: Secondary | ICD-10-CM | POA: Insufficient documentation

## 2019-11-09 DIAGNOSIS — L89143 Pressure ulcer of left lower back, stage 3: Secondary | ICD-10-CM

## 2019-11-09 DIAGNOSIS — L89100 Pressure ulcer of unspecified part of back, unstageable: Secondary | ICD-10-CM | POA: Diagnosis present

## 2019-11-09 DIAGNOSIS — Z96649 Presence of unspecified artificial hip joint: Secondary | ICD-10-CM | POA: Insufficient documentation

## 2019-11-09 LAB — COMPREHENSIVE METABOLIC PANEL
ALT: 14 U/L (ref 0–44)
AST: 13 U/L — ABNORMAL LOW (ref 15–41)
Albumin: 2.7 g/dL — ABNORMAL LOW (ref 3.5–5.0)
Alkaline Phosphatase: 160 U/L — ABNORMAL HIGH (ref 38–126)
Anion gap: 10 (ref 5–15)
BUN: 17 mg/dL (ref 6–20)
CO2: 29 mmol/L (ref 22–32)
Calcium: 8.7 mg/dL — ABNORMAL LOW (ref 8.9–10.3)
Chloride: 104 mmol/L (ref 98–111)
Creatinine, Ser: 0.64 mg/dL (ref 0.44–1.00)
GFR, Estimated: >60 mL/min (ref >60)
Glucose, Bld: 108 mg/dL — ABNORMAL HIGH (ref 70–99)
Potassium: 4.1 mmol/L (ref 3.5–5.1)
Sodium: 143 mmol/L (ref 135–145)
Total Bilirubin: 0.6 mg/dL (ref 0.3–1.2)
Total Protein: 7.7 g/dL (ref 6.5–8.1)

## 2019-11-09 LAB — CBC WITH DIFFERENTIAL/PLATELET
Abs Immature Granulocytes: 0.04 10*3/uL (ref 0.00–0.07)
Basophils Absolute: 0 10*3/uL (ref 0.0–0.1)
Basophils Relative: 0 %
Eosinophils Absolute: 0 10*3/uL (ref 0.0–0.5)
Eosinophils Relative: 0 %
HCT: 33.1 % — ABNORMAL LOW (ref 36.0–46.0)
Hemoglobin: 10.4 g/dL — ABNORMAL LOW (ref 12.0–15.0)
Immature Granulocytes: 0 %
Lymphocytes Relative: 12 %
Lymphs Abs: 1.2 10*3/uL (ref 0.7–4.0)
MCH: 27.2 pg (ref 26.0–34.0)
MCHC: 31.4 g/dL (ref 30.0–36.0)
MCV: 86.4 fL (ref 80.0–100.0)
Monocytes Absolute: 0.7 10*3/uL (ref 0.1–1.0)
Monocytes Relative: 7 %
Neutro Abs: 8 10*3/uL — ABNORMAL HIGH (ref 1.7–7.7)
Neutrophils Relative %: 81 %
Platelets: 336 10*3/uL (ref 150–400)
RBC: 3.83 MIL/uL — ABNORMAL LOW (ref 3.87–5.11)
RDW: 18.1 % — ABNORMAL HIGH (ref 11.5–15.5)
WBC: 10 10*3/uL (ref 4.0–10.5)
nRBC: 0 % (ref 0.0–0.2)

## 2019-11-09 LAB — LACTIC ACID, PLASMA: Lactic Acid, Venous: 1.2 mmol/L (ref 0.5–1.9)

## 2019-11-09 MED ORDER — CEPHALEXIN 500 MG PO CAPS: 500.0000 mg | ORAL_CAPSULE | Freq: Four times a day (QID) | ORAL | 0 refills | Status: AC

## 2019-11-09 NOTE — Discharge Instructions (Addendum)
Please go to [facility] emergency department for further evaluation of the wounds and possible surgical debridement.

## 2019-11-09 NOTE — Discharge Instructions (Signed)
Please follow-up in surgery clinic at 10:00 AM tomorrow for further evaluation of your wounds.

## 2019-11-09 NOTE — ED Triage Notes (Signed)
CG states patient is here for wounds on her back and bottom. CG is unsure of how long these have been there.

## 2019-11-09 NOTE — ED Provider Notes (Signed)
[facility]    CSN: [id number] Arrival date & time: 11/09/19  3557      History   Chief Complaint Chief Complaint  Patient presents with   Wound Check    HPI [name] is a 54 y.o. female.   54 year old female here for evaluation of sores on her back and bottom.  She is a resident of a group home and was transported by a caregiver other than her regular caregiver.  The caregiver that is with her is unsure of how long the wounds have been present.  Caregiver called the group home and no one there could offer any clarity either.  Patient has an appointment to see her PCP in 2 days and has been referred to the wound center but cannot be seen until December.  Patient is nonverbal and in a wheelchair.  Patient has mitts on both hands and when the caregiver was questioned it was reported that the patient bites her hands.  The mitts are therefore patient protection.     Past Medical History:  Diagnosis Date   Acne    Cerebral palsy (HCC)    Mental retardation    Myopia    Scoliosis    Seizure disorder [facility])     Patient Active Problem List   Diagnosis Date Noted   Mental retardation 02/21/2015   Seizure disorder, primary (HCC) 02/21/2015   Menopause 02/21/2015   Abnormal liver function tests 02/21/2015    Past Surgical History:  Procedure Laterality Date   PARTIAL HIP ARTHROPLASTY     REVISION TOTAL KNEE ARTHROPLASTY      OB History    Gravida  0   Para  0   Term  0   Preterm  0   AB  0   Living  0     SAB  0   TAB  0   Ectopic  0   Multiple  0   Live Births               Home Medications    Prior to Admission medications   Medication Sig Start Date End Date Taking? Authorizing Provider  aspirin 325 MG EC tablet Take 325 mg by mouth daily.   Yes [provider]  azelastine (OPTIVAR) 0.05 % ophthalmic solution Place 1 drop into both eyes 2 (two) times daily.   Yes [provider]  baclofen  (LIORESAL) 10 MG tablet Take 20 mg by mouth 3 (three) times daily.    Yes [provider]  carbamazepine (TEGRETOL XR) 200 MG 12 hr tablet Take 100 mg by mouth 2 (two) times daily.    Yes [provider]  cetirizine (ZYRTEC) 10 MG chewable tablet Chew 10 mg by mouth as needed.    Yes [provider]  cholecalciferol (VITAMIN D) 1000 units tablet Take 1,250 Units by mouth every other day.    Yes [provider]  dicyclomine (BENTYL) 10 MG capsule Take 10 mg by mouth 4 (four) times daily -  before meals and at bedtime.   Yes [provider]  furosemide (LASIX) 20 MG tablet Take 20 mg by mouth as needed.    Yes [provider]  levothyroxine (SYNTHROID, LEVOTHROID) 100 MCG tablet Take 100 mcg by mouth daily before breakfast.   Yes [provider]  LORazepam (ATIVAN) 1 MG tablet Take 1 mg by mouth as needed.    Yes [provider]  metoCLOPramide (REGLAN) 5 MG tablet Take 5  mg by mouth 4 (four) times daily.   Yes [provider]  mirtazapine (REMERON) 7.5 MG tablet Take 7.5 mg by mouth at bedtime.   Yes [provider]  Multiple Vitamin (MULTIVITAMIN) capsule Take 1 capsule by mouth daily.   Yes [provider]  OLANZapine (ZYPREXA) 5 MG tablet Take 5 mg by mouth as needed.    Yes [provider]  omeprazole (PRILOSEC) 20 MG capsule Take 20 mg by mouth 2 (two) times daily.    Yes [provider]  Poly Fe Cmplx-FeHemPoly-FA-B12 22-6-1-0.025 MG TABS Take 1 tablet by mouth.   Yes [provider]  POLYSACCH FE CMP-FE HEME POLY PO Take 150 mg by mouth daily.   Yes [provider]  potassium chloride (K-DUR,KLOR-CON) 10 MEQ tablet Take 10 mEq by mouth 2 (two) times daily.   Yes [provider]  Skin Protectants, Misc. (EUCERIN) cream Apply topically as needed for dry skin.   Yes [provider]  sucralfate (CARAFATE) 1 GM/10ML suspension Take 1 g by mouth 4  (four) times daily -  with meals and at bedtime.   Yes [provider]  Sulfacetamide Sodium, Acne, (KLARON) 10 % LOTN Apply topically 2 (two) times daily.    Yes [provider]  UNABLE TO FIND Med Name: CBD Oil 500mg /5630mL.  1 dropper under tongue BID.   Yes [provider]    Family History Family History  Problem Relation Age of Onset   Breast cancer Neg Hx    Ovarian cancer Neg Hx    Colon cancer Neg Hx     Social History Social History   Tobacco Use   Smoking status: Never Smoker   Smokeless tobacco: Never Used  Building services engineerVaping Use   Vaping Use: Never used  Substance Use Topics   Alcohol use: No   Drug use: No     Allergies   Amantadines   Review of Systems Review of Systems  Constitutional: Negative for activity change, appetite change and fever.  HENT: Negative for congestion and rhinorrhea.   Respiratory: Negative for cough and shortness of breath.   Gastrointestinal: Negative for diarrhea, nausea and vomiting.  Genitourinary: Negative for frequency.  Skin: Positive for wound.       Wound to right wrist, right upper back, and right lower back.  Also to sacrum.     Physical Exam Triage Vital Signs ED Triage Vitals [11/09/19 1044]  Enc Vitals Group     BP 104/76     Pulse Rate 86     Resp 20     Temp 98.3 F (36.8 C)     Temp Source Temporal     SpO2 98 %     Weight      Height      Head Circumference      Peak Flow      Pain Score      Pain Loc      Pain Edu?      Excl. in GC?    No data found.  Updated Vital Signs BP 104/76 (BP Location: Right Arm)    Pulse 86    Temp 98.3 F (36.8 C) (Temporal)    Resp 20    SpO2 98%   Visual Acuity Right Eye Distance:   Left Eye Distance:   Bilateral Distance:    Right Eye Near:   Left Eye Near:    Bilateral Near:     Physical Exam Vitals and nursing note reviewed.  Constitutional:      General: She is in acute distress.     Comments: Patient is crying and screaming.   Caregiver reports that patient usually makes noise but not screaming like this.  HENT:     Head: Normocephalic and atraumatic.  Cardiovascular:     Rate and Rhythm: Normal rate and regular rhythm.     Pulses: Normal pulses.     Heart sounds: Normal heart sounds. No murmur heard.  No gallop.   Pulmonary:     Effort: Pulmonary effort is normal.     Breath sounds: Normal breath sounds. No wheezing, rhonchi or rales.  Musculoskeletal:        General: Signs of injury present.     Comments: There is a 2 cm x 4 cm wound to the distal right wrist on the radial aspect.  The wound bed has yellow slough that was easily debrided.  Wound bed is pink, wound edges are erythematous.  No drainage from the wound.  There is a large 8 cm x 4 cm triangular-shaped skin ulcer on the right upper back.  There is brown eschar with yellow edges in the wound bed.  The skin edges of the wound are erythematous and the wound is hot.  Down lower on the right back a rib protrudes through the skin and there is a scabbed ulcer over top of the protrusion.  There is a DuoDERM present over a sacral wound with a pungent odor emanating from the wound.  The wound was not examined due to patient being in wheelchair.  Skin:    General: Skin is warm and dry.     Capillary Refill: Capillary refill takes less than 2 seconds.     Findings: Erythema present.     Comments: See above comments.  Neurological:     Mental Status: She is alert. Mental status is at baseline.      UC Treatments / Results  Labs (all labs ordered are listed, but only abnormal results are displayed) Labs Reviewed - No data to display  EKG   Radiology No results found.  Procedures Procedures (including critical care time)  Medications Ordered in UC Medications - No data to display  Initial Impression / Assessment and Plan / UC Course  I have reviewed the triage vital signs and the nursing notes.  Pertinent labs & imaging results that were  available during my care of the patient were reviewed by me and considered in my medical decision making (see chart for details).   Patient presents for evaluation of wounds of unknown chronicity on her back and bottom.  During the exam a dressing was noted on the distal right wrist and when it was removed there is a wound with clean edges that almost looks like a cut.  The wound bed has yellow slough that was easily removed with gauze.  Aerobic culture collected from the wound and wound care applied.  The edges of the wound are erythematous and hot but there is no drainage from the wound. There is a large pressure ulcer with brown S char and yellow slough on the borders with erythema to the skin border on the upper right back.  The area appears fluctuant, is hot, and red.  Further down there is a skin that protrudes through the skin and there is a scabbed quarter sized ulcer where the skin pushes out from the rib. There is a sacral wound that is covered with a DuoDERM.  I opted not  to examine the wound at this time because I feel that it needs evaluation in the emergency department.  There is a strong odor emanating from the wound.  I feel the wound on the upper part of her right back needs to be surgically debrided.  Will DC patient to [facility] emergency department. Final Clinical Impressions(s) / UC Diagnoses   Final diagnoses:  None   Discharge Instructions   None    ED Prescriptions    None     PDMP not reviewed this encounter.   Becky [place], NP 11/09/19 1117

## 2019-11-09 NOTE — ED Provider Notes (Signed)
[facility] Emergency Department Provider Note  ____________________________________________  Time seen: Approximately 5:07 PM  I have reviewed the triage vital signs and the nursing notes.   HISTORY  Chief Complaint Wound Infection    Level 5 Caveat: Portions of the History and Physical including HPI and review of systems are unable to be completely obtained due to patient intellectual disability and cerebral palsy  HPI [name] is a 54 y.o. female with a history of cerebral palsy, electrical disability, seizure disorder  who was sent to the ED from urgent care today for evaluation of decubitus wounds on the back.  Patient's caregiver at bedside reports that the patient's shifting zone air mattress malfunctioned a few weeks ago, took 4 days to be replaced and in that meantime the patient was sleeping in a recliner and developed some pressure injury to her skin.  They have seen primary care who referred them to wound care but appointment with the wound care center is not until December.  Patient is eating and drinking normally, taking medicines, no fever or other acute symptoms.  Was seen today in urgent care, urgent care note reviewed which describes multiple wounds, patient was sent to the ED with concern for possibly needing surgical debridement. Vital signs at urgent care were normal.     Past Medical History:  Diagnosis Date  . Acne   . Cerebral palsy (HCC)   . Mental retardation   . Myopia   . Scoliosis   . Seizure disorder [facility])      Patient Active Problem List   Diagnosis Date Noted  . Mental retardation 02/21/2015  . Seizure disorder, primary (HCC) 02/21/2015  . Menopause 02/21/2015  . Abnormal liver function tests 02/21/2015     Past Surgical History:  Procedure Laterality Date  . PARTIAL HIP ARTHROPLASTY    . REVISION TOTAL KNEE ARTHROPLASTY       Prior to Admission medications   Medication Sig Start Date End Date  Taking? Authorizing Provider  aspirin 325 MG EC tablet Take 325 mg by mouth daily.    [provider]  azelastine (OPTIVAR) 0.05 % ophthalmic solution Place 1 drop into both eyes 2 (two) times daily.    [provider]  baclofen (LIORESAL) 10 MG tablet Take 20 mg by mouth 3 (three) times daily.     [provider]  carbamazepine (TEGRETOL XR) 200 MG 12 hr tablet Take 100 mg by mouth 2 (two) times daily.     [provider]  cephALEXin (KEFLEX) 500 MG capsule Take 1 capsule (500 mg total) by mouth 4 (four) times daily for 10 days. 11/09/19 11/19/19  Sharman Cheek, MD  cetirizine (ZYRTEC) 10 MG chewable tablet Chew 10 mg by mouth as needed.     [provider]  cholecalciferol (VITAMIN D) 1000 units tablet Take 1,250 Units by mouth every other day.     [provider]  dicyclomine (BENTYL) 10 MG capsule Take 10 mg by mouth 4 (four) times daily -  before meals and at bedtime.    [provider]  furosemide (LASIX) 20 MG tablet Take 20 mg by mouth as needed.     [provider]  levothyroxine (SYNTHROID, LEVOTHROID) 100 MCG tablet Take 100 mcg by mouth daily before breakfast.    [provider]  LORazepam (ATIVAN) 1 MG tablet Take 1 mg by mouth as needed.     [provider]  metoCLOPramide (REGLAN) 5 MG tablet Take 5 mg  by mouth 4 (four) times daily.    [provider]  mirtazapine (REMERON) 7.5 MG tablet Take 7.5 mg by mouth at bedtime.    [provider]  Multiple Vitamin (MULTIVITAMIN) capsule Take 1 capsule by mouth daily.    [provider]  OLANZapine (ZYPREXA) 5 MG tablet Take 5 mg by mouth as needed.     [provider]  omeprazole (PRILOSEC) 20 MG capsule Take 20 mg by mouth 2 (two) times daily.     [provider]  Poly Fe Cmplx-FeHemPoly-FA-B12 22-6-1-0.025 MG TABS Take 1 tablet by mouth.    [provider]  POLYSACCH FE CMP-FE HEME POLY PO Take  150 mg by mouth daily.    [provider]  potassium chloride (K-DUR,KLOR-CON) 10 MEQ tablet Take 10 mEq by mouth 2 (two) times daily.    [provider]  Skin Protectants, Misc. (EUCERIN) cream Apply topically as needed for dry skin.    [provider]  sucralfate (CARAFATE) 1 GM/10ML suspension Take 1 g by mouth 4 (four) times daily -  with meals and at bedtime.    [provider]  Sulfacetamide Sodium, Acne, (KLARON) 10 % LOTN Apply topically 2 (two) times daily.     [provider]  UNABLE TO FIND Med Name: CBD Oil 500mg /8730mL.  1 dropper under tongue BID.    [provider]     Allergies Amantadines   Family History  Problem Relation Age of Onset  . Breast cancer Neg Hx   . Ovarian cancer Neg Hx   . Colon cancer Neg Hx     Social History Social History   Tobacco Use  . Smoking status: Never Smoker  . Smokeless tobacco: Never Used  Vaping Use  . Vaping Use: Never used  Substance Use Topics  . Alcohol use: No  . Drug use: No    Review of Systems Level 5 Caveat: Portions of the History and Physical including HPI and review of systems are unable to be completely obtained due to patient being a poor historian   Constitutional:   No known fever.  ENT:   No rhinorrhea. Cardiovascular:   No chest pain or syncope. Respiratory:   No dyspnea or cough. Gastrointestinal:   Negative for abdominal pain, vomiting and diarrhea.  Musculoskeletal:   Negative for focal pain or swelling ____________________________________________   PHYSICAL EXAM:  VITAL SIGNS: ED Triage Vitals  Enc Vitals Group     BP 11/09/19 1244 127/83     Pulse Rate 11/09/19 1244 94     Resp 11/09/19 1244 20     Temp 11/09/19 1244 98.7 F (37.1 C)     Temp Source 11/09/19 1244 Oral     SpO2 11/09/19 1244 100 %     Weight 11/09/19 1245 80 lb (36.3 kg)     Height 11/09/19 1245 4\' 10"  (1.473 m)     Head Circumference --      Peak Flow --      Pain  Score --      Pain Loc --      Pain Edu? --      Excl. in GC? --     Vital signs reviewed, nursing assessments reviewed.   Constitutional: Awake, not oriented. Non-toxic appearance. Eyes:   Conjunctivae are normal. EOMI. PERRL. ENT      Head:   Normocephalic and atraumatic.      Nose:   No congestion/rhinnorhea.       Mouth/Throat:  MMM, no pharyngeal erythema. No peritonsillar mass.       Neck:   No meningismus. Full ROM. Hematological/Lymphatic/Immunilogical:   No cervical lymphadenopathy. Cardiovascular:   RRR. Symmetric bilateral radial and DP pulses.  No murmurs. Cap refill less than 2 seconds. Respiratory:   Normal respiratory effort without tachypnea/retractions. Breath sounds are clear and equal bilaterally. No wheezes/rales/rhonchi. Gastrointestinal:   Soft and nontender. Non distended. There is no CVA tenderness.  No rebound, rigidity, or guarding.  Musculoskeletal:   Normal range of motion in all extremities. No joint effusions.  No lower extremity tenderness.  There is some 4 cm unstageable pressure ulcer overlying the right scapula posteriorly with some central eschar.  No purulent drainage or surrounding cellulitis.  Not fluctuant, no crepitus.  At right mid back there is an unstable 2 cm pressure ulcer as well overlying the prominence of the rib.  There is no bone exposure to air despite the description from the urgent care.  This wound is sealed at the layer of the epidermis.  There is a third pressure ulcer which is stage II, only about half a centimeter big, at the lumbar sacral prominence  There is a 1 cm fourth pressure ulcer, stage III on the left side of the superior gluteal cleft.  None of the ulcers show surrounding cellulitis or fluctuance or crepitus.    Neurologic:   Normal speech and language.  Motor grossly intact. No acute focal neurologic deficits are appreciated.  Skin:    Skin is warm, dry with wounds as above. No rash noted.  No petechiae,  purpura, or bullae.  ____________________________________________    LABS (pertinent positives/negatives) (all labs ordered are listed, but only abnormal results are displayed) Labs Reviewed  CBC WITH DIFFERENTIAL/PLATELET - Abnormal; Notable for the following components:      Result Value   RBC 3.83 (*)    Hemoglobin 10.4 (*)    HCT 33.1 (*)    RDW 18.1 (*)    Neutro Abs 8.0 (*)    All other components within normal limits  COMPREHENSIVE METABOLIC PANEL - Abnormal; Notable for the following components:   Glucose, Bld 108 (*)    Calcium 8.7 (*)    Albumin 2.7 (*)    AST 13 (*)    Alkaline Phosphatase 160 (*)    All other components within normal limits  LACTIC ACID, PLASMA   ____________________________________________   EKG    ____________________________________________    RADIOLOGY  No results found.  ____________________________________________   PROCEDURES Procedures  ____________________________________________    CLINICAL IMPRESSION / ASSESSMENT AND PLAN / ED COURSE  Medications ordered in the ED: Medications - No data to display  Pertinent labs & imaging results that were available during my care of the patient were reviewed by me and considered in my medical decision making (see chart for details).   KOBY HARTFIELD was evaluated in Emergency Department on 11/09/2019 for the symptoms described in the history of present illness. She was evaluated in the context of the global COVID-19 pandemic, which necessitated consideration that the patient might be at risk for infection with the SARS-CoV-2 virus that causes COVID-19. Institutional protocols and algorithms that pertain to the evaluation of patients at risk for COVID-19 are in a state of rapid change based on information released by regulatory bodies including the CDC and federal and state organizations. These policies and algorithms were followed during the patient's care in the ED.   Patient sent  to ED for evaluation of  pressure ulcers.  Vital signs are normal and remain normal on repeat monitoring.  Patient's labs are normal, patient is nontoxic and ate a whole [employer] meal today.  She is at her baseline behavior and state of health according to caregiver at bedside except for the wounds.  I discussed the findings with surgery Dr. Everlene Farrier who advises that this will not need urgent surgical intervention.  I doubt cellulitis but will start her on Keflex.  Dr. Everlene Farrier can see her in clinic tomorrow at 10 AM and her caregiver advises that they will be able to make that appointment.  She is not septic, stable for outpatient follow-up at this time.      ____________________________________________   FINAL CLINICAL IMPRESSION(S) / ED DIAGNOSES    Final diagnoses:  Pressure injury of right upper back, unstageable (HCC)  Pressure injury of left lower back, stage 3 [facility])     ED Discharge Orders         Ordered    cephALEXin (KEFLEX) 500 MG capsule  4 times daily        11/09/19 1706          Portions of this note were generated with dragon dictation software. Dictation errors may occur despite best attempts at proofreading.   Sharman Cheek, MD 11/09/19 1714

## 2019-11-09 NOTE — ED Triage Notes (Signed)
Brought by caregiver for wound check--went to [facility] urgent care for wounds on back and buttocks.  They sent here for possible debridement and infection.  Caregiver says the bottocks wound has bad odor.  Patient is in wheel chair, makes noises and looks around, but no verbal seen.

## 2019-11-10 ENCOUNTER — Encounter: Payer: Self-pay | Admitting: Surgery

## 2019-11-10 ENCOUNTER — Ambulatory Visit (INDEPENDENT_AMBULATORY_CARE_PROVIDER_SITE_OTHER): Payer: Medicare Other | Admitting: Surgery

## 2019-11-10 ENCOUNTER — Other Ambulatory Visit: Payer: Self-pay

## 2019-11-10 DIAGNOSIS — L89112 Pressure ulcer of right upper back, stage 2: Secondary | ICD-10-CM | POA: Diagnosis not present

## 2019-11-10 NOTE — Patient Instructions (Signed)
Reposition patient from position so she will be off of sores.   Follow up as needed call the office if you have any questions or concerns.

## 2019-11-12 LAB — AEROBIC CULTURE W GRAM STAIN (SUPERFICIAL SPECIMEN)

## 2019-11-12 LAB — AEROBIC CULTURE  (SUPERFICIAL SPECIMEN): Special Requests: NORMAL

## 2019-11-12 NOTE — Progress Notes (Signed)
Patient ID: [name], female   DOB: 1965/09/25, 54 y.o.   MRN: [id number]  HPI [name] is a 54 y.o. female in consultation at the request of Dr. Scotty Court for decubitus ulcer.  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 She does have a history of cerebral palsy and seizure disorder.  Was seen in the emergency room yesterday for concerns of decubitus ulcers.  She is accompanied by her caregiver and reports that 2 days ago she was in an air mattress and there was a malfunction of the air mattress.  Replacement of that took at least 3 days.  She went back to a recliner and was there for a while.  Caregiver then noticed 3 pressure ulcers to on the upper back and one in the sacrum.  No fevers no chills.  The patient is total care and is nonverbal.  She apparently is eating and drinking normally.  No fevers   Discussed with Dr. Scotty Court in detail  HPI  Past Medical History:  Diagnosis Date  . Acne   . Cerebral palsy (HCC)   . Mental retardation   . Myopia   . Scoliosis   . Seizure disorder [facility])     Past Surgical History:  Procedure Laterality Date  . PARTIAL HIP ARTHROPLASTY    . REVISION TOTAL KNEE ARTHROPLASTY      Family History  Problem Relation Age of Onset  . Breast cancer Neg Hx   . Ovarian cancer Neg Hx   . Colon cancer Neg Hx     Social History Social History   Tobacco Use  . Smoking status: Never Smoker  . Smokeless tobacco: Never Used  Vaping Use  . Vaping Use: Never used  Substance Use Topics  . Alcohol use: No  . Drug use: No    Allergies  Allergen Reactions  . Amantadines Nausea And Vomiting    Current Outpatient Medications  Medication Sig Dispense Refill  . aspirin 325 MG EC tablet Take 325 mg by mouth daily.    Marland Kitchen azelastine (OPTIVAR) 0.05 % ophthalmic solution Place 1 drop into both eyes 2 (two) times daily.    . baclofen (LIORESAL) 10 MG tablet Take 20 mg by mouth 3 (three) times daily.      . carbamazepine (TEGRETOL XR) 200 MG 12 hr tablet Take 100 mg by mouth 2 (two) times daily.     . cephALEXin (KEFLEX) 500 MG capsule Take 1 capsule (500 mg total) by mouth 4 (four) times daily for 10 days. 40 capsule 0  . cetirizine (ZYRTEC) 10 MG chewable tablet Chew 10 mg by mouth as needed.     . cholecalciferol (VITAMIN D) 1000 units tablet Take 1,250 Units by mouth every other day.     . dicyclomine (BENTYL) 10 MG capsule Take 10 mg by mouth 4 (four) times daily -  before meals and at bedtime.    . furosemide (LASIX) 20 MG tablet Take 20 mg by mouth as needed.     . hydrOXYzine (ATARAX/VISTARIL) 25 MG tablet Take by mouth.    . iron polysaccharides (IFEREX 150) 150 MG capsule Take 1 capsule by mouth daily.    Marland Kitchen levothyroxine (SYNTHROID, LEVOTHROID) 100 MCG tablet Take 100 mcg by mouth daily before breakfast.    . LORazepam (ATIVAN) 1 MG tablet Take 1 mg by mouth as needed.     . metoCLOPramide (REGLAN) 5 MG tablet Take 5 mg by mouth 4 (four) times daily.    Marland Kitchen  mirtazapine (REMERON) 7.5 MG tablet Take 7.5 mg by mouth at bedtime.    . Multiple Vitamin (MULTIVITAMIN) capsule Take 1 capsule by mouth daily.    Marland Kitchen OLANZapine (ZYPREXA) 5 MG tablet Take 5 mg by mouth as needed.     Marland Kitchen omeprazole (PRILOSEC) 20 MG capsule Take 20 mg by mouth 2 (two) times daily.     . Poly Fe Cmplx-FeHemPoly-FA-B12 22-6-1-0.025 MG TABS Take 1 tablet by mouth.    Marland Kitchen POLYSACCH FE CMP-FE HEME POLY PO Take 150 mg by mouth daily.    . potassium chloride (K-DUR,KLOR-CON) 10 MEQ tablet Take 10 mEq by mouth 2 (two) times daily.    . Skin Protectants, Misc. (EUCERIN) cream Apply topically as needed for dry skin.    Marland Kitchen sucralfate (CARAFATE) 1 GM/10ML suspension Take 1 g by mouth 4 (four) times daily -  with meals and at bedtime.    . Sulfacetamide Sodium, Acne, (KLARON) 10 % LOTN Apply topically 2 (two) times daily.     Marland Kitchen UNABLE TO FIND Med Name: CBD Oil 500mg /44mL.  1 dropper under tongue BID.     No current  facility-administered medications for this visit.     Review of Systems Full ROS unable to be obtained due to cerebral palsy  Physical Exam There were no vitals taken for this visit. CONSTITUTIONAL: Dilatated and malnourished patient in a wheelchair EYES: Pupils are equal, round, , Sclera are non-icteric. EARS, NOSE, MOUTH AND THROAT: She is wearing a mask. Hearing is intact to voice. LYMPH NODES:  Lymph nodes in the neck are normal. RESPIRATORY:  Lungs are clear. There is normal respiratory effort, with equal breath sounds bilaterally, and without pathologic use of accessory muscles. CARDIOVASCULAR: Heart is regular without murmurs, gallops, or rubs. GI: The abdomen is soft, nontender, and nondistended. There are no palpable masses. There is no hepatosplenomegaly. There are normal bowel sounds in all quadrants. GU: Rectal deferred.   MUSCULOSKELETAL: . No cyanosis or edema.   SKIN: #1 2 x 2cms sacral midline decubitus ulcer, some fibrinous exudate.  No evidence of necrosis. #2 left scapular eschar measuring 4 x 3 cm.  There is no evidence of necrotizing infection there is no evidence of undrained pockets #3 right posterior chest wall wound measuring 2 x 1 cm.  Eschar no evidence of abscess no evidence of cellulitis NEUROLOGIC: She is quadriplegic with significant contractions on both upper and lower extremities.  She is in a wheelchair accompanied by her caregiver  PSYCH: Well mental disorder.  Nonverbal      Data Reviewed  I have personally reviewed the patient's imaging, laboratory findings and medical records.    Assessment/Plan Pressure ulcers sacrum and back.  The main issue is offloading of the pressure.  Now that she has a new air mattress I do not anticipate any problems.  Currently there is no need for debridement.  No need for surgical intervention.  She may follow-up with the wound care clinic.   Time spent with the patient was 45 minutes, with more than 50% of the  time spent in face-to-face education, counseling and care coordination.     31m, MD FACS General Surgeon 11/12/2019, 12:38 PM

## 2019-11-17 ENCOUNTER — Ambulatory Visit: Payer: Self-pay | Admitting: Surgery

## 2020-01-11 ENCOUNTER — Encounter: Payer: Medicare Other | Attending: Physician Assistant | Admitting: Physician Assistant

## 2020-01-11 ENCOUNTER — Other Ambulatory Visit: Payer: Self-pay

## 2020-01-11 DIAGNOSIS — S61501A Unspecified open wound of right wrist, initial encounter: Secondary | ICD-10-CM | POA: Insufficient documentation

## 2020-01-11 DIAGNOSIS — X58XXXA Exposure to other specified factors, initial encounter: Secondary | ICD-10-CM | POA: Insufficient documentation

## 2020-01-11 DIAGNOSIS — F72 Severe intellectual disabilities: Secondary | ICD-10-CM | POA: Diagnosis not present

## 2020-01-11 DIAGNOSIS — F0151 Vascular dementia with behavioral disturbance: Secondary | ICD-10-CM | POA: Diagnosis not present

## 2020-01-11 DIAGNOSIS — R532 Functional quadriplegia: Secondary | ICD-10-CM | POA: Diagnosis not present

## 2020-01-11 DIAGNOSIS — G809 Cerebral palsy, unspecified: Secondary | ICD-10-CM | POA: Diagnosis not present

## 2020-01-11 DIAGNOSIS — L89312 Pressure ulcer of right buttock, stage 2: Secondary | ICD-10-CM | POA: Diagnosis not present

## 2020-01-11 DIAGNOSIS — S21209A Unspecified open wound of unspecified back wall of thorax without penetration into thoracic cavity, initial encounter: Secondary | ICD-10-CM | POA: Diagnosis present

## 2020-01-12 NOTE — Progress Notes (Signed)
AUBRIEL, KHANNA ([id number]) Visit Report for 01/11/2020 Abuse/Suicide Risk Screen Details Patient Name: [name], [name]. Date of Service: 01/11/2020 12:45 PM Medical Record Number: [id number] Patient Account Number: [id number] Date of Birth/Sex: 03-06-1965 (54 y.o. F) Treating RN: Rogers Blocker Primary Care Eufemia Prindle: Einar Crow Other Clinician: Referring Fremon Zacharia: Einar Crow Treating Tekeshia Klahr/Extender: Allen Derry Weeks in Treatment: 0 Abuse/Suicide Risk Screen Items Answer Notes patient unable to respond Electronic Signature(s) Signed: 01/11/2020 5:06:49 PM By: Phillis Haggis, Dondra Prader RN Entered By: Phillis Haggis, Dondra Prader on 01/11/2020 13:16:35 [name] ([id number]) -------------------------------------------------------------------------------- Activities of Daily Living Details Patient Name: [name], [name]. Date of Service: 01/11/2020 12:45 PM Medical Record Number: [id number] Patient Account Number: [id number] Date of Birth/Sex: 1965/05/10 (54 y.o. F) Treating RN: Rogers Blocker Primary Care Delcie Ruppert: Einar Crow Other Clinician: Referring Cordelle Dahmen: Einar Crow Treating Divon Krabill/Extender: Rowan Blase in Treatment: 0 Activities of Daily Living Items Answer Activities of Daily Living (Please select one for each item) Drive Automobile Not Able Take Medications Not Able Use Telephone Not Able Care for Appearance Not Able Use Toilet Not Able Mady Haagensen / Shower Not Able Dress Self Not Able Feed Self Not Able Walk Not Able Get In / Out Bed Not Able Housework Not Able Prepare Meals Not Able Handle Money Not Able Shop for Self Not Able Electronic Signature(s) Signed: 01/11/2020 5:06:49 PM By: Phillis Haggis, Dondra Prader RN Entered By: Phillis Haggis, Dondra Prader on 01/11/2020 13:16:53 [name] ([id number]) -------------------------------------------------------------------------------- Education Screening Details Patient  Name: [name]. Date of Service: 01/11/2020 12:45 PM Medical Record Number: [id number] Patient Account Number: [id number] Date of Birth/Sex: 11-07-1965 (54 y.o. F) Treating RN: Rogers Blocker Primary Care Javionna Leder: Einar Crow Other Clinician: Referring Keagen Heinlen: Einar Crow Treating Maxx Pham/Extender: Rowan Blase in Treatment: 0 Primary Learner Assessed: Caregiver house caregiver Patient has mental retardation Reason Patient is not Primary Learner: and is unable to answer. Learning Preferences/Education Level/Primary Language Preferred Language: English Motivation Anxiety Level: Calm Cooperation: Web designer) Signed: 01/11/2020 5:06:49 PM By: Phillis Haggis, Dondra Prader RN Entered By: Phillis Haggis, Dondra Prader on 01/11/2020 13:22:27 [name], [name] ([id number]) -------------------------------------------------------------------------------- Fall Risk Assessment Details Patient Name: [name]. Date of Service: 01/11/2020 12:45 PM Medical Record Number: [id number] Patient Account Number: [id number] Date of Birth/Sex: Dec 25, 1965 (54 y.o. F) Treating RN: Rogers Blocker Primary Care Daniella Dewberry: Einar Crow Other Clinician: Referring Ladarrell Cornwall: Einar Crow Treating Karita Dralle/Extender: Rowan Blase in Treatment: 0 Fall Risk Assessment Items Have you had 2 or more falls in the last 12 monthso 0 No Have you had any fall that resulted in injury in the last 12 monthso 0 No FALLS RISK SCREEN History of falling - immediate or within 3 months 0 No Secondary diagnosis (Do you have 2 or more medical diagnoseso) 15 Yes Ambulatory aid None/bed rest/wheelchair/nurse 0 Yes Crutches/cane/walker 0 No Furniture 0 No Intravenous therapy Access/Saline/Heparin Lock 0 No Gait/Transferring Normal/ bed rest/ wheelchair 0 Yes Weak (short steps with or without shuffle, stooped but able to lift head while walking, may 0 No seek  support from furniture) Impaired (short steps with shuffle, may have difficulty arising from chair, head down, impaired 0 No balance) Mental Status Oriented to own ability 0 No Electronic Signature(s) Signed: 01/11/2020 5:06:49 PM By: Phillis Haggis, Dondra Prader RN Entered By: Phillis Haggis, Kenia on 01/11/2020 13:26:55 [name] ([id number]) -------------------------------------------------------------------------------- Foot Assessment Details Patient Name: [name]. Date of Service: 01/11/2020 12:45 PM Medical Record Number: [id number] Patient Account Number: [id number] Date of Birth/Sex: [birth year] (54 y.o. F) Treating  RN: Rogers Blocker Primary Care Shawan Tosh: Einar Crow Other Clinician: Referring Saben Donigan: Einar Crow Treating Tymel Conely/Extender: Rowan Blase in Treatment: 0 Foot Assessment Items [x]  Unable to perform due to altered mental status Site Locations + = Sensation present, - = Sensation absent, C = Callus, U = Ulcer R = Redness, W = Warmth, M = Maceration, PU = Pre-ulcerative lesion F = Fissure, S = Swelling, D = Dryness Assessment Right: Left: Other Deformity: No No Prior Foot Ulcer: No No Prior Amputation: No No Charcot Joint: No No Ambulatory Status: Non-ambulatory Assistance Device: Wheelchair Gait: Electronic Signature(s) Signed: 01/11/2020 5:06:49 PM By: 01/13/2020, Phillis Haggis RN Entered By: Dondra Prader, Phillis Haggis on 01/11/2020 13:28:16 01/13/2020 ([name]) -------------------------------------------------------------------------------- Nutrition Risk Screening Details Patient Name: [id number]. Date of Service: 01/11/2020 12:45 PM Medical Record Number: 01/13/2020 Patient Account Number: [id number] Date of Birth/Sex: August 01, 1965 (54 y.o. F) Treating RN: 04-26-1995 Primary Care Chriss Mannan: Rogers Blocker Other Clinician: Referring Tayjah Lobdell: Einar Crow Treating Raksha Wolfgang/Extender: Einar Crow Weeks in Treatment: 0 Height (in): Weight (lbs): Body Mass Index (BMI): Nutrition Risk Screening Items Score Screening NUTRITION RISK SCREEN: I have an illness or condition that made me change the kind and/or amount of food I eat 0 No I eat fewer than two meals per day 0 No I eat few fruits and vegetables, or milk products 0 No I have three or more drinks of beer, liquor or wine almost every day 0 No I have tooth or mouth problems that make it hard for me to eat 0 No I don't always have enough money to buy the food I need 0 No I eat alone most of the time 0 No I take three or more different prescribed or over-the-counter drugs a day 1 Yes Without wanting to, I have lost or gained 10 pounds in the last six months 0 No I am not always physically able to shop, cook and/or feed myself 2 Yes Nutrition Protocols Good Risk Protocol Moderate Risk Protocol 0 Provide education on nutrition High Risk Proctocol Risk Level: Moderate Risk Score: 3 Electronic Signature(s) Signed: 01/11/2020 5:06:49 PM By: 01/13/2020, Phillis Haggis RN Entered By: Dondra Prader, Phillis Haggis on 01/11/2020 13:28:00

## 2020-01-12 NOTE — Progress Notes (Signed)
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 ([id number]) -------------------------------------------------------------------------------- ROS/PFSH Details Patient Name: [name]. Date of Service: 01/11/2020 12:45 PM Medical Record Number: [id number] Patient Account Number: [id number] Date of Birth/Sex: [birth year] (54 y.o. F) Treating RN: Rogers Blocker Primary Care Provider: Einar Crow Other Clinician: Referring Provider: Einar Crow Treating Provider/Extender: Rowan Blase in Treatment: 0 Unable to Obtain Patient History due to oo Altered Mental Status Constitutional Symptoms (General Health) Medical History: Past Medical History Notes: Seizure Disorder; CP; mental retardation, quadriplegia; seizures thrombocytopenia Respiratory Medical History: Positive for: Sleep Apnea Neurologic Medical History: Positive for: Quadriplegia Immunizations Pneumococcal Vaccine: Received  Pneumococcal Vaccination: No Implantable Devices No devices added Family and Social History Never smoker; Marital Status - Single; Alcohol Use: Never; Drug Use: No History; Caffeine Use: Never Electronic Signature(s) Signed: 01/11/2020 5:06:49 PM By: Phillis Haggis, Dondra Prader RN Signed: 01/11/2020 5:24:08 PM By: Lenda Kelp PA-C Entered By: Phillis Haggis, Dondra Prader on 01/11/2020 13:16:18 [name] ([id number]) -------------------------------------------------------------------------------- SuperBill Details Patient Name: [name]. Date of Service: 01/11/2020 Medical Record Number: [id number] Patient Account Number: [id number] Date of Birth/Sex: 07/22/1965 (54 y.o. 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 ulcer of right buttock, stage 2 S61.501A Unspecified open wound of right wrist, initial encounter F01.51 Vascular dementia with behavioral disturbance Facility Procedures CPT4 Code: 32440102 Description: 240 [id number] - WOUND CARE VISIT-LEV 2 EST PT Modifier: Quantity: 1 Physician Procedures CPT4: Description Modifier Quantity Code [id number] - WC PHYS LEVEL 4 - EST PT 1 CPT4: ICD-10 Diagnosis Description S21.209A Unspecified open wound of unspecified back wall of thorax without penetration into thoracic cavity, initial encounter L89.312 Pressure ulcer of right buttock, stage 2 S61.501A Unspecified open wound of right  wrist, initial encounter F01.51 Vascular dementia with behavioral disturbance Electronic Signature(s) Signed: 01/11/2020 5:23:40 PM By: Lenda Kelp PA-C Previous Signature: 01/11/2020 5:06:49 PM Version  By: Phillis Haggis, Dondra Prader RN Entered By: Lenda Kelp on 01/11/2020 17:23:40

## 2020-01-12 NOTE — Progress Notes (Signed)
[name] ([id number]) Visit Report for 01/11/2020 Allergy List Details Patient Name: [name], SCHNYDER. Date of Service: 01/11/2020 12:45 PM Medical Record Number: [id number] Patient Account Number: [id number] Date of Birth/Sex: 06/01/1965 (54 y.o. F) Treating RN: Rogers Blocker Primary Care Accalia Rigdon: Einar Crow Other Clinician: Referring Milli Woolridge: Einar Crow Treating Uzair Godley/Extender: Allen Derry Weeks in Treatment: 0 Allergies Active Allergies No Known Drug Allergies Type: Allergen Allergy Notes Electronic Signature(s) Signed: 01/11/2020 5:06:49 PM By: Phillis Haggis, Dondra Prader RN Entered By: Phillis Haggis, Dondra Prader on 01/11/2020 13:21:04 [name] ([id number]) -------------------------------------------------------------------------------- Arrival Information Details Patient Name: [name]. Date of Service: 01/11/2020 12:45 PM Medical Record Number: [id number] Patient Account Number: [id number] Date of Birth/Sex: Jun 03, 1965 (54 y.o. F) Treating RN: Rogers Blocker Primary Care Pammy Vesey: Einar Crow Other Clinician: Referring Leeza Heiner: Einar Crow Treating Trinton Prewitt/Extender: Rowan Blase in Treatment: 0 Visit Information Patient Arrived: Wheel Chair Arrival Time: 12:49 Accompanied By: caregivers Transfer Assistance: EasyPivot Patient Lift Patient Identification Verified: Yes Secondary Verification Process Completed: Yes History Since Last Visit Electronic Signature(s) Signed: 01/11/2020 5:06:49 PM By: Phillis Haggis, Dondra Prader RN Entered By: Phillis Haggis, Dondra Prader on 01/11/2020 13:15:36 [name] ([id number]) -------------------------------------------------------------------------------- Clinic Level of Care Assessment Details Patient Name: [name]. Date of Service: 01/11/2020 12:45 PM Medical Record Number: [id number] Patient Account Number: [id number] Date of Birth/Sex: March 07, 1965 (54 y.o.  F) Treating RN: Rogers Blocker Primary Care Seini Lannom: Einar Crow Other Clinician: Referring Elita Dame: Einar Crow Treating Dickson Kostelnik/Extender: Rowan Blase in Treatment: 0 Clinic Level of Care Assessment Items TOOL 4 Quantity Score X - Use when only an EandM is performed on FOLLOW-UP visit 1 0 ASSESSMENTS - Nursing Assessment / Reassessment X - Reassessment of Co-morbidities (includes updates in patient status) 1 10 X- 1 5 Reassessment of Adherence to Treatment Plan ASSESSMENTS - Wound and Skin Assessment / Reassessment []  - Simple Wound Assessment / Reassessment - one wound 0 X- 1 5 Complex Wound Assessment / Reassessment - multiple wounds []  - 0 Dermatologic / Skin Assessment (not related to wound area) ASSESSMENTS - Focused Assessment []  - Circumferential Edema Measurements - multi extremities 0 []  - 0 Nutritional Assessment / Counseling / Intervention []  - 0 Lower Extremity Assessment (monofilament, tuning fork, pulses) []  - 0 Peripheral Arterial Disease Assessment (using hand held doppler) ASSESSMENTS - Ostomy and/or Continence Assessment and Care []  - Incontinence Assessment and Management 0 []  - 0 Ostomy Care Assessment and Management (repouching, etc.) PROCESS - Coordination of Care X - Simple Patient / Family Education for ongoing care 1 15 []  - 0 Complex (extensive) Patient / Family Education for ongoing care []  - 0 Staff obtains , Records, Test Results / Process Orders []  - 0 Staff telephones HHA, Nursing Homes / Clarify orders / etc []  - 0 Routine Transfer to another Facility (non-emergent condition) []  - 0 Routine Hospital Admission (non-emergent condition) []  - 0 New Admissions / / Ordering NPWT, Apligraf, etc. []  - 0 Emergency Hospital Admission (emergent condition) X- 1 10 Simple Discharge Coordination []  - 0 Complex (extensive) Discharge Coordination PROCESS - Special Needs []  - Pediatric / Minor  Patient Management 0 []  - 0 Isolation Patient Management []  - 0 Hearing / Language / Visual special needs []  - 0 Assessment of Community assistance (transportation, D/C planning, etc.) []  - 0 Additional assistance / Altered mentation []  - 0 Support Surface(s) Assessment (bed, cushion, seat, etc.) INTERVENTIONS - Wound Cleansing / Measurement Croft, Jerney K. ( ) []  - 0 Simple Wound Cleansing - one  wound X- 1 5 Complex Wound Cleansing - multiple wounds X- 1 5 Wound Imaging (photographs - any number of wounds)  - 0 Wound Tracing (instead of photographs)  - 0 Simple Wound Measurement - one wound X- 1 5 Complex Wound Measurement - multiple wounds INTERVENTIONS - Wound Dressings X - Small Wound Dressing one or multiple wounds 1 10  - 0 Medium Wound Dressing one or multiple wounds  - 0 Large Wound Dressing one or multiple wounds  - 0 Application of Medications - topical  - 0 Application of Medications - injection INTERVENTIONS - Miscellaneous  - External ear exam 0  - 0 Specimen Collection (cultures, biopsies, blood, body fluids, etc.)  - 0 Specimen(s) / Culture(s) sent or taken to Lab for analysis  - 0 Patient Transfer (multiple staff / Nurse, adult / Similar devices)  - 0 Simple Staple / Suture removal (25 or less)  - 0 Complex Staple / Suture removal (26 or more)  - 0 Hypo / Hyperglycemic Management (close monitor of Blood Glucose)  - 0 Ankle / Brachial Index (ABI) - do not check if billed separately X- 1 5 Vital Signs Has the patient been seen at the hospital within the last three years: Yes Total Score: 75 Level Of Care: New/Established - Level 2 Electronic Signature(s) Signed: 01/11/2020 5:06:49 PM By: Phillis Haggis, Dondra Prader RN Entered By: Phillis Haggis, Kenia on 01/11/2020 13:48:41 [name] ([id number]) -------------------------------------------------------------------------------- Encounter Discharge  Information Details Patient Name: [name]. Date of Service: 01/11/2020 12:45 PM Medical Record Number: [id number] Patient Account Number: [id number] Date of Birth/Sex: 1965-11-23 (54 y.o. F) Treating RN: Rogers Blocker Primary Care Starnisha Batrez: Einar Crow Other Clinician: Referring Gibran Veselka: Einar Crow Treating Fumiye Lubben/Extender: Rowan Blase in Treatment: 0 Encounter Discharge Information Items Discharge Condition: Stable Ambulatory Status: Wheelchair Discharge Destination: Skilled Nursing Facility Orders Sent: Yes Transportation: Private Auto Accompanied By: caregiver Schedule Follow-up Appointment: Yes Clinical Summary of Care: Electronic Signature(s) Signed: 01/11/2020 5:06:49 PM By: Phillis Haggis, Dondra Prader RN Entered By: Phillis Haggis, Dondra Prader on 01/11/2020 13:51:02 [name] ([id number]) -------------------------------------------------------------------------------- Lower Extremity Assessment Details Patient Name: [name]. Date of Service: 01/11/2020 12:45 PM Medical Record Number: [id number] Patient Account Number: [id number] Date of Birth/Sex: July 11, 1965 (54 y.o. F) Treating RN: Rogers Blocker Primary Care Surafel Hilleary: Einar Crow Other Clinician: Referring Braycen Burandt: Einar Crow Treating Cooper Stamp/Extender: Allen Derry Weeks in Treatment: 0 Electronic Signature(s) Signed: 01/11/2020 5:06:49 PM By: Phillis Haggis, Dondra Prader RN Entered By: Phillis Haggis, Dondra Prader on 01/11/2020 13:15:46 [name] ([id number]) -------------------------------------------------------------------------------- Multi Wound Chart Details Patient Name: [name]. Date of Service: 01/11/2020 12:45 PM Medical Record Number: [id number] Patient Account Number: [id number] Date of Birth/Sex: 1965/12/29 (54 y.o. F) Treating RN: Rogers Blocker Primary Care Merline Perkin: Einar Crow Other Clinician: Referring Jonah Nestle: Einar Crow Treating Lance Huaracha/Extender: Rowan Blase in Treatment: 0 Vital Signs Height(in): Pulse(bpm): 92 Weight(lbs): Blood Pressure(mmHg): 129/79 Body Mass Index(BMI): Temperature(F): 97.5 Respiratory Rate(breaths/min): 18 Photos: Wound Location: Distal Back Right Gluteus Right Wrist Wounding Event: Skin Tear/Laceration Pressure Injury Skin Tear/Laceration Primary Etiology: Skin Tear Pressure Ulcer Skin Tear Comorbid History: Sleep Apnea, Quadriplegia Sleep Apnea, Quadriplegia Sleep Apnea, Quadriplegia Date Acquired: 10/22/2019 10/22/2019 10/22/2019 Weeks of Treatment: 0 0 0 Wound Status: Open Open Open Measurements L x W x D (cm) 0.1x0.1x0.1 0.5x0.2x0.1 5x5x0.1 Area (cm) : 0.008 0.079 19.635 Volume (cm) : 0.001 0.008 1.963 % Reduction in Area: 0.00% 0.00% 0.00% % Reduction in Volume: 0.00% 0.00% 0.00% Classification: Full Thickness Without Exposed Category/Stage II Partial  Thickness Support Structures Exudate Amount: Small N/A None Present Exudate Type: Sanguinous N/A N/A Exudate Color: red N/A N/A Granulation Amount: Large (67-100%) None Present (0%) None Present (0%) Granulation Quality: Red N/A N/A Necrotic Amount: None Present (0%) Large (67-100%) Large (67-100%) Necrotic Tissue: N/A Eschar Eschar Exposed Structures: Fat Layer (Subcutaneous Tissue): N/A Fascia: No Yes Fat Layer (Subcutaneous Tissue): Fascia: No No Tendon: No Tendon: No Muscle: No Muscle: No Joint: No Joint: No Bone: No Bone: No Epithelialization: None None None Wound Number: 9 N/A N/A Photos: N/A N/A Wound Location: Proximal Back N/A N/A YAQUELIN, LANGELIER ([id number]) Wounding Event: Skin Tear/Laceration N/A N/A Primary Etiology: Skin Tear N/A N/A Comorbid History: Sleep Apnea, Quadriplegia N/A N/A Date Acquired: 10/22/2019 N/A N/A Weeks of Treatment: 0 N/A N/A Wound Status: Open N/A N/A Measurements L x W x D (cm) 1x0.9x0.1 N/A N/A Area (cm) : 0.707 N/A N/A Volume (cm) : 0.071  N/A N/A % Reduction in Area: 0.00% N/A N/A % Reduction in Volume: 0.00% N/A N/A Classification: Full Thickness Without Exposed N/A N/A Support Structures Exudate Amount: Medium N/A N/A Exudate Type: Sanguinous N/A N/A Exudate Color: red N/A N/A Granulation Amount: Large (67-100%) N/A N/A Granulation Quality: Red N/A N/A Necrotic Amount: None Present (0%) N/A N/A Necrotic Tissue: N/A N/A N/A Exposed Structures: Fat Layer (Subcutaneous Tissue): N/A N/A Yes Fascia: No Tendon: No Muscle: No Joint: No Bone: No Epithelialization: None N/A N/A Treatment Notes Electronic Signature(s) Signed: 01/11/2020 5:06:49 PM By: Phillis Haggis, Dondra Prader RN Entered By: Phillis Haggis, Dondra Prader on 01/11/2020 13:34:03 [name] ([id number]) -------------------------------------------------------------------------------- Pain Assessment Details Patient Name: [name]. Date of Service: 01/11/2020 12:45 PM Medical Record Number: [id number] Patient Account Number: [id number] Date of Birth/Sex: 1965/08/21 (54 y.o. F) Treating RN: Rogers Blocker Primary Care Cambria Osten: Einar Crow Other Clinician: Referring Icis Budreau: Einar Crow Treating Wauneta Silveria/Extender: Rowan Blase in Treatment: 0 Active Problems Location of Pain Severity and Description of Pain Patient Has Paino Patient Unable to Respond Site Locations Pain Management and Medication Current Pain Management: Electronic Signature(s) Signed: 01/11/2020 5:06:49 PM By: Phillis Haggis, Dondra Prader RN Entered By: Phillis Haggis, Dondra Prader on 01/11/2020 13:15:07 [name] ([id number]) -------------------------------------------------------------------------------- Patient/Caregiver Education Details Patient Name: [name]. Date of Service: 01/11/2020 12:45 PM Medical Record Number: [id number] Patient Account Number: [id number] Date of Birth/Gender: [birth year] (54 y.o. F) Treating RN: Rogers Blocker Primary  Care Physician: Einar Crow Other Clinician: Referring Physician: Einar Crow Treating Physician/Extender: Rowan Blase in Treatment: 0 Education Assessment Education Provided To: Caregiver Education Topics Provided Wound/Skin Impairment: Methods: Explain/Verbal Responses: State content correctly Electronic Signature(s) Signed: 01/11/2020 5:06:49 PM By: Phillis Haggis, Dondra Prader RN Entered By: Phillis Haggis, Dondra Prader on 01/11/2020 13:49:04 [name] ([id number]) -------------------------------------------------------------------------------- Wound Assessment Details Patient Name: [name]. Date of Service: 01/11/2020 12:45 PM Medical Record Number: [id number] Patient Account Number: [id number] Date of Birth/Sex: 1965-02-26 (54 y.o. F) Treating RN: Rogers Blocker Primary Care Khiara Shuping: Einar Crow Other Clinician: Referring Elisea Khader: Einar Crow Treating Shellie Goettl/Extender: Allen Derry Weeks in Treatment: 0 Wound Status Wound Number: 10 Primary Etiology: Skin Tear Wound Location: Distal Back Wound Status: Open Wounding Event: Skin Tear/Laceration Comorbid History: Sleep Apnea, Quadriplegia Date Acquired: 10/22/2019 Weeks Of Treatment: 0 Clustered Wound: No Photos Wound Measurements Length: (cm) 0.1 Width: (cm) 0.1 Depth: (cm) 0.1 Area: (cm) 0.008 Volume: (cm) 0.001 % Reduction in Area: 0% % Reduction in Volume: 0% Epithelialization: None Tunneling: No Undermining: No Wound Description Classification: Full Thickness Without Exposed Support Structures Exudate Amount: Small  Exudate Type: Sanguinous Exudate Color: red Foul Odor After Cleansing: No Slough/Fibrino No Wound Bed Granulation Amount: Large (67-100%) Exposed Structure Granulation Quality: Red Fascia Exposed: No Necrotic Amount: None Present (0%) Fat Layer (Subcutaneous Tissue) Exposed: Yes Tendon Exposed: No Muscle Exposed: No Joint Exposed: No Bone  Exposed: No Treatment Notes Wound #10 (Distal Back) Notes prisma, BFD Electronic Signature(s) Signed: 01/11/2020 5:06:49 PM By: Phillis Haggis, Dondra Prader RN Bonneau, Kadijah K. ([id number]) Entered By: Phillis Haggis, Dondra Prader on 01/11/2020 13:25:55 [name] ([id number]) -------------------------------------------------------------------------------- Wound Assessment Details Patient Name: [name]. Date of Service: 01/11/2020 12:45 PM Medical Record Number: [id number] Patient Account Number: [id number] Date of Birth/Sex: 1965/07/05 (54 y.o. F) Treating RN: Rogers Blocker Primary Care Goebel Hellums: Einar Crow Other Clinician: Referring Arminta Gamm: Einar Crow Treating Virdie Penning/Extender: Allen Derry Weeks in Treatment: 0 Wound Status Wound Number: 11 Primary Etiology: Pressure Ulcer Wound Location: Right Gluteus Wound Status: Open Wounding Event: Pressure Injury Comorbid History: Sleep Apnea, Quadriplegia Date Acquired: 10/22/2019 Weeks Of Treatment: 0 Clustered Wound: No Photos Wound Measurements Length: (cm) 0.1 Width: (cm) 0.1 Depth: (cm) 0.1 Area: (cm) 0.008 Volume: (cm) 0.001 % Reduction in Area: 89.9% % Reduction in Volume: 87.5% Epithelialization: None Tunneling: No Undermining: No Wound Description Classification: Category/Stage II Wound Bed Granulation Amount: None Present (0%) Necrotic Amount: Large (67-100%) Necrotic Quality: Eschar Treatment Notes Wound #11 (Right Gluteus) Notes BFD Electronic Signature(s) Signed: 01/11/2020 5:06:49 PM By: Phillis Haggis, Dondra Prader RN Entered By: Phillis Haggis, Dondra Prader on 01/11/2020 13:36:01 [name] ([id number]) -------------------------------------------------------------------------------- Wound Assessment Details Patient Name: [name]. Date of Service: 01/11/2020 12:45 PM Medical Record Number: [id number] Patient Account Number: [id number] Date of Birth/Sex: 1965-10-09 (54  y.o. F) Treating RN: Rogers Blocker Primary Care Janashia Parco: Einar Crow Other Clinician: Referring Machai Desmith: Einar Crow Treating Kyah Buesing/Extender: Rowan Blase in Treatment: 0 Wound Status Wound Number: 12 Primary Etiology: Skin Tear Wound Location: Right Wrist Wound Status: Open Wounding Event: Skin Tear/Laceration Comorbid History: Sleep Apnea, Quadriplegia Date Acquired: 10/22/2019 Weeks Of Treatment: 0 Clustered Wound: No Photos Wound Measurements Length: (cm) 0.1 Width: (cm) 0.1 Depth: (cm) 0.1 Area: (cm) 0.008 Volume: (cm) 0.001 % Reduction in Area: 100% % Reduction in Volume: 99.9% Epithelialization: None Tunneling: No Undermining: No Wound Description Classification: Partial Thickness Exudate Amount: None Present Foul Odor After Cleansing: No Slough/Fibrino No Wound Bed Granulation Amount: None Present (0%) Exposed Structure Necrotic Amount: Large (67-100%) Fascia Exposed: No Necrotic Quality: Eschar Fat Layer (Subcutaneous Tissue) Exposed: No Tendon Exposed: No Muscle Exposed: No Joint Exposed: No Bone Exposed: No Treatment Notes Wound #12 (Right Wrist) Notes BFD Electronic Signature(s) Signed: 01/11/2020 5:06:49 PM By: Phillis Haggis, Dondra Prader RN Entered By: Phillis Haggis, Kenia on 01/11/2020 13:36:01 [name] ([id number]) Delton Coombes, [name] ([id number]) -------------------------------------------------------------------------------- Wound Assessment Details Patient Name: [name]. Date of Service: 01/11/2020 12:45 PM Medical Record Number: [id number] Patient Account Number: [id number] Date of Birth/Sex: 1965/12/22 (54 y.o. F) Treating RN: Rogers Blocker Primary Care Teniyah Seivert: Einar Crow Other Clinician: Referring Leeloo Silverthorne: Einar Crow Treating Wasim Hurlbut/Extender: Rowan Blase in Treatment: 0 Wound Status Wound Number: 9 Primary Etiology: Skin Tear Wound Location: Proximal Back Wound  Status: Open Wounding Event: Skin Tear/Laceration Comorbid History: Sleep Apnea, Quadriplegia Date Acquired: 10/22/2019 Weeks Of Treatment: 0 Clustered Wound: No Wound under treatment by Ata Pecha outside of Wound Center Photos Wound Measurements Length: (cm) 1 Width: (cm) 0.9 Depth: (cm) 0.1 Area: (cm) 0.707 Volume: (cm) 0.071 % Reduction in Area: 0% % Reduction in Volume: 0% Epithelialization: None Tunneling: No  Undermining: No Wound Description Classification: Full Thickness Without Exposed Support Structures Exudate Amount: Medium Exudate Type: Sanguinous Exudate Color: red Foul Odor After Cleansing: No Slough/Fibrino No Wound Bed Granulation Amount: Large (67-100%) Exposed Structure Granulation Quality: Red Fascia Exposed: No Necrotic Amount: None Present (0%) Fat Layer (Subcutaneous Tissue) Exposed: Yes Tendon Exposed: No Muscle Exposed: No Joint Exposed: No Bone Exposed: No Treatment Notes Wound #9 (Proximal Back) Notes prisma, BFD Electronic Signature(s) Signed: 01/11/2020 5:06:49 PM By: Phillis HaggisSanchez Pereyda, Dondra PraderKenia RN Stutsman, Jemila K. ([id number]) Entered By: Phillis HaggisSanchez Pereyda, Dondra PraderKenia on 01/11/2020 13:25:16 Carolyn OrleansBYRUM, Nina K. ([id number]) -------------------------------------------------------------------------------- Vitals Details Patient Name: Carolyn OrleansBYRUM, Aanvi K. Date of Service: 01/11/2020 12:45 PM Medical Record Number: [id number] Patient Account Number: [id number] Date of Birth/Sex: 1965-03-15 (54 y.o. F) Treating RN: Rogers BlockerSanchez, Kenia Primary Care Angee Gupton: Einar CrowAnderson, Marshall Other Clinician: Referring Cherelle Midkiff: Einar CrowAnderson, Marshall Treating Ellowyn Rieves/Extender: Rowan BlaseStone, Hoyt Weeks in Treatment: 0 Vital Signs Time Taken: 13:13 Temperature (F): 97.5 Pulse (bpm): 92 Respiratory Rate (breaths/min): 18 Blood Pressure (mmHg): 129/79 Reference Range: 80 - 120 mg / dl Electronic Signature(s) Signed: 01/11/2020 5:06:49 PM By: Phillis HaggisSanchez Pereyda, Dondra PraderKenia RN Entered By:  Phillis HaggisSanchez Pereyda, Dondra PraderKenia on 01/11/2020 13:13:52

## 2020-02-01 ENCOUNTER — Encounter: Payer: Medicare Other | Attending: Physician Assistant | Admitting: Physician Assistant

## 2020-02-01 ENCOUNTER — Other Ambulatory Visit: Payer: Self-pay

## 2020-02-01 DIAGNOSIS — R532 Functional quadriplegia: Secondary | ICD-10-CM | POA: Insufficient documentation

## 2020-02-01 DIAGNOSIS — G809 Cerebral palsy, unspecified: Secondary | ICD-10-CM | POA: Insufficient documentation

## 2020-02-01 DIAGNOSIS — G40909 Epilepsy, unspecified, not intractable, without status epilepticus: Secondary | ICD-10-CM | POA: Diagnosis not present

## 2020-02-01 DIAGNOSIS — X58XXXA Exposure to other specified factors, initial encounter: Secondary | ICD-10-CM | POA: Insufficient documentation

## 2020-02-01 DIAGNOSIS — F72 Severe intellectual disabilities: Secondary | ICD-10-CM | POA: Insufficient documentation

## 2020-02-01 DIAGNOSIS — F0390 Unspecified dementia without behavioral disturbance: Secondary | ICD-10-CM | POA: Diagnosis not present

## 2020-02-01 DIAGNOSIS — S61501A Unspecified open wound of right wrist, initial encounter: Secondary | ICD-10-CM | POA: Insufficient documentation

## 2020-02-01 DIAGNOSIS — L89312 Pressure ulcer of right buttock, stage 2: Secondary | ICD-10-CM | POA: Diagnosis not present

## 2020-02-01 DIAGNOSIS — S21209A Unspecified open wound of unspecified back wall of thorax without penetration into thoracic cavity, initial encounter: Secondary | ICD-10-CM | POA: Insufficient documentation

## 2020-02-01 NOTE — Progress Notes (Addendum)
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 ICD-10 Diagnosis Description S21.209A Unspecified open wound of unspecified back wall of thorax without penetration into thoracic cavity, initial encounter Electronic Signature(s) Signed: 02/01/2020 2:33:41 PM By: Lenda Kelp PA-C Previous Signature: 02/01/2020 11:51:30 AM Version By: Elliot Gurney, BSN, RN, CWS, Kim RN, BSN Entered By: Lenda Kelp on 02/01/2020 14:33:41

## 2020-02-01 NOTE — Progress Notes (Signed)
KENITRA, LEVENTHAL ([id number]) Visit Report for 02/01/2020 Arrival Information Details Patient Name: [name], [name]. Date of Service: 02/01/2020 11:00 AM Medical Record Number: [id number] Patient Account Number: [id number] Date of Birth/Sex: 02/27/1965 (55 y.o. F) Treating RN: Rogers Blocker Primary Care Buena Boehm: Einar Crow Other Clinician: Referring Ranie Chinchilla: Einar Crow Treating Abrianna Sidman/Extender: Rowan Blase in Treatment: 3 Visit Information History Since Last Visit Pain Present Now: Unable to Respond Patient Arrived: Wheel Chair Arrival Time: 11:02 Accompanied By: caregiver Transfer Assistance: EasyPivot Patient Lift Patient Identification Verified: Yes Secondary Verification Process Completed: Yes Electronic Signature(s) Signed: 02/01/2020 12:18:16 PM By: Phillis Haggis, Dondra Prader RN Entered By: Phillis Haggis, Dondra Prader on 02/01/2020 11:03:20 [name] ([id number]) -------------------------------------------------------------------------------- Clinic Level of Care Assessment Details Patient Name: [name]. Date of Service: 02/01/2020 11:00 AM Medical Record Number: [id number] Patient Account Number: [id number] Date of Birth/Sex: [birth year] (55 y.o. F) Treating RN: Rogers Blocker Primary Care Alter Moss: Einar Crow Other Clinician: Referring Briley Sulton: Einar Crow Treating Terryl Niziolek/Extender: Rowan Blase in Treatment: 3 Clinic Level of Care Assessment Items TOOL 4 Quantity Score []  - Use when only an EandM is performed on FOLLOW-UP visit 0 ASSESSMENTS - Nursing Assessment / Reassessment X - Reassessment of Co-morbidities (includes updates in patient status) 1 10 X- 1 5 Reassessment of Adherence to Treatment Plan ASSESSMENTS - Wound and Skin Assessment / Reassessment []  - Simple Wound Assessment / Reassessment - one wound 0 X- 2 5 Complex Wound Assessment / Reassessment - multiple wounds []  - 0 Dermatologic / Skin  Assessment (not related to wound area) ASSESSMENTS - Focused Assessment []  - Circumferential Edema Measurements - multi extremities 0 []  - 0 Nutritional Assessment / Counseling / Intervention []  - 0 Lower Extremity Assessment (monofilament, tuning fork, pulses) []  - 0 Peripheral Arterial Disease Assessment (using hand held doppler) ASSESSMENTS - Ostomy and/or Continence Assessment and Care []  - Incontinence Assessment and Management 0 []  - 0 Ostomy Care Assessment and Management (repouching, etc.) PROCESS - Coordination of Care X - Simple Patient / Family Education for ongoing care 1 15 []  - 0 Complex (extensive) Patient / Family Education for ongoing care X- 1 10 Staff obtains , Records, Test Results / Process Orders []  - 0 Staff telephones HHA, Nursing Homes / Clarify orders / etc []  - 0 Routine Transfer to another Facility (non-emergent condition) []  - 0 Routine Hospital Admission (non-emergent condition) []  - 0 New Admissions / / Ordering NPWT, Apligraf, etc. []  - 0 Emergency Hospital Admission (emergent condition) X- 1 10 Simple Discharge Coordination []  - 0 Complex (extensive) Discharge Coordination PROCESS - Special Needs []  - Pediatric / Minor Patient Management 0 []  - 0 Isolation Patient Management []  - 0 Hearing / Language / Visual special needs []  - 0 Assessment of Community assistance (transportation, D/C planning, etc.) []  - 0 Additional assistance / Altered mentation []  - 0 Support Surface(s) Assessment (bed, cushion, seat, etc.) INTERVENTIONS - Wound Cleansing / Measurement Meckley, Zacari K. ( ) []  - 0 Simple Wound Cleansing - one wound X- 2 5 Complex Wound Cleansing - multiple wounds X- 1 5 Wound Imaging (photographs - any number of wounds) []  - 0 Wound Tracing (instead of photographs) []  - 0 Simple Wound Measurement - one wound X- 2 5 Complex Wound Measurement - multiple wounds INTERVENTIONS - Wound  Dressings []  - Small Wound Dressing one or multiple wounds 0 X- 2 15 Medium Wound Dressing one or multiple wounds []  - 0 Large Wound Dressing one or multiple wounds []  -  0 Application of Medications - topical []  - 0 Application of Medications - injection INTERVENTIONS - Miscellaneous []  - External ear exam 0 []  - 0 Specimen Collection (cultures, biopsies, blood, body fluids, etc.) []  - 0 Specimen(s) / Culture(s) sent or taken to Lab for analysis []  - 0 Patient Transfer (multiple staff / / Similar devices) []  - 0 Simple Staple / Suture removal (25 or less) []  - 0 Complex Staple / Suture removal (26 or more) []  - 0 Hypo / Hyperglycemic Management (close monitor of Blood Glucose) []  - 0 Ankle / Brachial Index (ABI) - do not check if billed separately X- 1 5 Vital Signs Has the patient been seen at the hospital within the last three years: Yes Total Score: 120 Level Of Care: New/Established - Level 4 Electronic Signature(s) Signed: 02/01/2020 12:18:16 PM By: , RN Entered By: , Kenia on 02/01/2020 11:39:26 ( ) -------------------------------------------------------------------------------- Encounter Discharge Information Details Patient Name: . Date of Service: 02/01/2020 11:00 AM Medical Record Number: 03/31/2020 Patient Account Number: Phillis Haggis Date of Birth/Sex: 1965/08/01 (55 y.o. F) Treating RN: 03/31/2020 Primary Care Avyukth Bontempo: [name] Other Clinician: Referring Donley Harland: [phone] Treating Kervin Bones/Extender: [name] in Treatment: 3 Encounter Discharge Information Items Discharge Condition: Stable Ambulatory Status: Wheelchair Discharge Destination: Home Transportation: Private Auto Accompanied By: caregiver Schedule Follow-up Appointment: Yes Clinical Summary of Care: Electronic Signature(s) Signed: 02/01/2020 12:18:16 PM By: [id number], [id number] RN Entered By: 10/23/1965, 04-26-1993 on 02/01/2020 11:40:37 Einar Crow (Einar Crow) -------------------------------------------------------------------------------- Lower Extremity Assessment Details Patient Name: Rowan Blase. Date of Service: 02/01/2020 11:00 AM Medical Record Number: Phillis Haggis Patient Account Number: Dondra Prader Date of Birth/Sex: 10/05/1965 (55 y.o. F) Treating RN: 03/31/2020 Primary Care Meliton Samad: [name] Other Clinician: Referring Kleigh Hoelzer: [phone] Treating Francetta Ilg/Extender: [name] Weeks in Treatment: 3 Electronic Signature(s) Signed: 02/01/2020 12:18:16 PM By: [id number], [id number] RN Entered By: 10/23/1965, 04-26-1993 on 02/01/2020 11:17:29 Einar Crow (Einar Crow) -------------------------------------------------------------------------------- Multi Wound Chart Details Patient Name: Allen Derry. Date of Service: 02/01/2020 11:00 AM Medical Record Number: Phillis Haggis Patient Account Number: Dondra Prader Date of Birth/Sex: 1965/02/06 (55 y.o. F) Treating RN: 03/31/2020 Primary Care Jhalil Silvera: [name] Other Clinician: Referring Hawke Villalpando: [phone] Treating Imanie Darrow/Extender: [name] in Treatment: 3 Vital Signs Height(in): Pulse(bpm): 73 Weight(lbs): Blood Pressure(mmHg): 121/84 Body Mass Index(BMI): Temperature(F): 98.2 Respiratory Rate(breaths/min): 18 Photos: Wound Location: Distal Back Right Gluteus Right Wrist Wounding Event: Skin Tear/Laceration Pressure Injury Skin Tear/Laceration Primary Etiology: Skin Tear Pressure Ulcer Skin Tear Comorbid History: Sleep Apnea, Quadriplegia Sleep Apnea, Quadriplegia Sleep Apnea, Quadriplegia Date Acquired: 10/22/2019 10/22/2019 10/22/2019 Weeks of Treatment: 3 3 3  Wound Status: Healed - Epithelialized Healed - Epithelialized Open Measurements L x W x D (cm) 0x0x0 0x0x0 0.4x0.4x0.1 Area (cm) : 0 0 0.126 Volume  (cm) : 0 0 0.013 % Reduction in Area: 100.00% 100.00% -1475.00% % Reduction in Volume: 100.00% 100.00% -1200.00% Classification: Full Thickness Without Exposed Category/Stage II Partial Thickness Support Structures Exudate Amount: None Present None Present None Present Exudate Type: N/A N/A N/A Exudate Color: N/A N/A N/A Granulation Amount: N/A N/A Small (1-33%) Granulation Quality: N/A N/A Red Necrotic Amount: N/A N/A Large (67-100%) Exposed Structures: Fascia: No Fascia: No Fascia: No Fat Layer (Subcutaneous Tissue): Fat Layer (Subcutaneous Tissue): Fat Layer (Subcutaneous Tissue): No No No Tendon: No Tendon: No Tendon: No Muscle: No Muscle: No Muscle: No Joint: No Joint: No Joint: No Bone: No Bone: No Bone: No  Epithelialization: Large (67-100%) None None Wound Number: 9 N/A N/A Photos: N/A N/A Wound Location: Proximal Back N/A N/A Wounding Event: Skin Tear/Laceration N/A N/A CULLEN, VANALLEN ([id number]) Primary Etiology: Skin Tear N/A N/A Comorbid History: Sleep Apnea, Quadriplegia N/A N/A Date Acquired: 10/22/2019 N/A N/A Weeks of Treatment: 3 N/A N/A Wound Status: Open N/A N/A Measurements L x W x D (cm) 1x0.8x0.1 N/A N/A Area (cm) : 0.628 N/A N/A Volume (cm) : 0.063 N/A N/A % Reduction in Area: 11.20% N/A N/A % Reduction in Volume: 11.30% N/A N/A Classification: Full Thickness Without Exposed N/A N/A Support Structures Exudate Amount: Medium N/A N/A Exudate Type: Sanguinous N/A N/A Exudate Color: red N/A N/A Granulation Amount: Large (67-100%) N/A N/A Granulation Quality: Red N/A N/A Necrotic Amount: None Present (0%) N/A N/A Exposed Structures: Fat Layer (Subcutaneous Tissue): N/A N/A Yes Fascia: No Tendon: No Muscle: No Joint: No Bone: No Epithelialization: None N/A N/A Treatment Notes Electronic Signature(s) Signed: 02/01/2020 12:18:16 PM By: Phillis Haggis, Dondra Prader RN Entered By: Phillis Haggis, Dondra Prader on 02/01/2020 11:29:40 [name] ([id number]) -------------------------------------------------------------------------------- Multi-Disciplinary Care Plan Details Patient Name: [name]. Date of Service: 02/01/2020 11:00 AM Medical Record Number: [id number] Patient Account Number: [id number] Date of Birth/Sex: [birth year] (55 y.o. F) Treating RN: Rogers Blocker Primary Care Hilliary Jock: Einar Crow Other Clinician: Referring Sher Shampine: Einar Crow Treating Arina Torry/Extender: Rowan Blase in Treatment: 3 Active Inactive Orientation to the Wound Care Program Nursing Diagnoses: Knowledge deficit related to the wound healing center program Goals: Patient/caregiver will verbalize understanding of the Wound Healing Center Program Date Initiated: 02/01/2020 Target Resolution Date: 02/08/2020 Goal Status: Active Interventions: Provide education on orientation to the wound center Notes: Wound/Skin Impairment Nursing Diagnoses: Knowledge deficit related to ulceration/compromised skin integrity Goals: Patient/caregiver will verbalize understanding of skin care regimen Date Initiated: 02/01/2020 Target Resolution Date: 02/15/2020 Goal Status: Active Ulcer/skin breakdown will have a volume reduction of 30% by week 4 Date Initiated: 02/01/2020 Target Resolution Date: 02/22/2020 Goal Status: Active Interventions: Assess ulceration(s) every visit Treatment Activities: Skin care regimen initiated : 02/01/2020 Topical wound management initiated : 02/01/2020 Notes: Electronic Signature(s) Signed: 02/01/2020 12:18:16 PM By: Phillis Haggis, Dondra Prader RN Entered By: Phillis Haggis, Dondra Prader on 02/01/2020 11:28:57 [name] ([id number]) -------------------------------------------------------------------------------- Pain Assessment Details Patient Name: [name]. Date of Service: 02/01/2020 11:00 AM Medical Record Number: [id number] Patient Account Number: [id number] Date of Birth/Sex:  11-20-1965 (55 y.o. F) Treating RN: Rogers Blocker Primary Care Jalal Rauch: Einar Crow Other Clinician: Referring Naome Brigandi: Einar Crow Treating Geofrey Silliman/Extender: Rowan Blase in Treatment: 3 Active Problems Location of Pain Severity and Description of Pain Patient Has Paino Patient Unable to Respond Site Locations Pain Management and Medication Current Pain Management: Electronic Signature(s) Signed: 02/01/2020 12:18:16 PM By: Phillis Haggis, Dondra Prader RN Entered By: Phillis Haggis, Dondra Prader on 02/01/2020 11:07:01 [name] ([id number]) -------------------------------------------------------------------------------- Patient/Caregiver Education Details Patient Name: [name]. Date of Service: 02/01/2020 11:00 AM Medical Record Number: [id number] Patient Account Number: [id number] Date of Birth/Gender: Mar 03, 1965 (55 y.o. F) Treating RN: Rogers Blocker Primary Care Physician: Einar Crow Other Clinician: Referring Physician: Einar Crow Treating Physician/Extender: Rowan Blase in Treatment: 3 Education Assessment Education Provided To: Patient Education Topics Provided Wound/Skin Impairment: Handouts: Caring for Your Ulcer Methods: Demonstration, Explain/Verbal Responses: State content correctly Electronic Signature(s) Signed: 02/01/2020 12:18:16 PM By: Phillis Haggis, Dondra Prader RN Entered By: Phillis Haggis, Dondra Prader on 02/01/2020 11:39:54 [name] ([id number]) -------------------------------------------------------------------------------- Wound Assessment Details Patient Name: [name]. Date of Service: 02/01/2020 11:00  AM Medical Record Number: [id number] Patient Account Number: [id number] Date of Birth/Sex: 07-27-1965 (55 y.o. F) Treating RN: Rogers BlockerSanchez, Kenia Primary Care Nabria Nevin: Einar CrowAnderson, Marshall Other Clinician: Referring Rolena Knutson: Einar CrowAnderson, Marshall Treating Larenzo Caples/Extender: Rowan BlaseStone, Hoyt Weeks in  Treatment: 3 Wound Status Wound Number: 10 Primary Etiology: Skin Tear Wound Location: Distal Back Wound Status: Healed - Epithelialized Wounding Event: Skin Tear/Laceration Comorbid History: Sleep Apnea, Quadriplegia Date Acquired: 10/22/2019 Weeks Of Treatment: 3 Clustered Wound: No Photos Wound Measurements Length: (cm) 0 Width: (cm) 0 Depth: (cm) 0 Area: (cm) 0 Volume: (cm) 0 % Reduction in Area: 100% % Reduction in Volume: 100% Epithelialization: Large (67-100%) Tunneling: No Undermining: No Wound Description Classification: Full Thickness Without Exposed Support Structure Exudate Amount: None Present s Wound Bed Necrotic Amount: None Present (0%) Exposed Structure Fascia Exposed: No Fat Layer (Subcutaneous Tissue) Exposed: No Tendon Exposed: No Muscle Exposed: No Joint Exposed: No Bone Exposed: No Treatment Notes Wound #10 (Back) Wound Laterality: Distal Cleanser Peri-Wound Care Topical Primary Dressing Secondary Dressing Danae OrleansBYRUM, Kao K. ([id number]) Secured With Compression Wrap Compression Stockings Add-Ons Electronic Signature(s) Signed: 02/01/2020 12:18:16 PM By: Phillis HaggisSanchez Pereyda, Dondra PraderKenia RN Entered By: Phillis HaggisSanchez Pereyda, Dondra PraderKenia on 02/01/2020 11:14:52 Danae OrleansBYRUM, Coree K. ([id number]) -------------------------------------------------------------------------------- Wound Assessment Details Patient Name: Danae OrleansBYRUM, Naketa K. Date of Service: 02/01/2020 11:00 AM Medical Record Number: [id number] Patient Account Number: [id number] Date of Birth/Sex: 07-27-1965 (55 y.o. F) Treating RN: Rogers BlockerSanchez, Kenia Primary Care Grainger Mccarley: Einar CrowAnderson, Marshall Other Clinician: Referring Gisel Vipond: Einar CrowAnderson, Marshall Treating Bexlee Bergdoll/Extender: Rowan BlaseStone, Hoyt Weeks in Treatment: 3 Wound Status Wound Number: 11 Primary Etiology: Pressure Ulcer Wound Location: Right Gluteus Wound Status: Healed - Epithelialized Wounding Event: Pressure Injury Comorbid History: Sleep Apnea,  Quadriplegia Date Acquired: 10/22/2019 Weeks Of Treatment: 3 Clustered Wound: No Photos Wound Measurements Length: (cm) 0 % Redu Width: (cm) 0 % Redu Depth: (cm) 0 Epithe Area: (cm) 0 Tunne Volume: (cm) 0 Under ction in Area: 100% ction in Volume: 100% lialization: None ling: No mining: No Wound Description Classification: Category/Stage II Foul Exudate Amount: None Present Sloug Odor After Cleansing: No h/Fibrino No Wound Bed Necrotic Amount: None Present (0%) Exposed Structure Fascia Exposed: No Fat Layer (Subcutaneous Tissue) Exposed: No Tendon Exposed: No Muscle Exposed: No Joint Exposed: No Bone Exposed: No Treatment Notes Wound #11 (Gluteus) Wound Laterality: Right Cleanser Peri-Wound Care Topical Primary Dressing Danae OrleansBYRUM, Dianca K. ([id number]) Secondary Dressing Secured With Compression Wrap Compression Stockings Add-Ons Electronic Signature(s) Signed: 02/01/2020 12:18:16 PM By: Phillis HaggisSanchez Pereyda, Dondra PraderKenia RN Entered By: Phillis HaggisSanchez Pereyda, Dondra PraderKenia on 02/01/2020 11:15:41 Danae OrleansBYRUM, Daiya K. ([id number]) -------------------------------------------------------------------------------- Wound Assessment Details Patient Name: Danae OrleansBYRUM, Taylormarie K. Date of Service: 02/01/2020 11:00 AM Medical Record Number: [id number] Patient Account Number: [id number] Date of Birth/Sex: 07-27-1965 (55 y.o. F) Treating RN: Rogers BlockerSanchez, Kenia Primary Care Dashon Mcintire: Einar CrowAnderson, Marshall Other Clinician: Referring Elisea Khader: Einar CrowAnderson, Marshall Treating Cutberto Winfree/Extender: Rowan BlaseStone, Hoyt Weeks in Treatment: 3 Wound Status Wound Number: 12 Primary Etiology: Skin Tear Wound Location: Right Wrist Wound Status: Open Wounding Event: Skin Tear/Laceration Comorbid History: Sleep Apnea, Quadriplegia Date Acquired: 10/22/2019 Weeks Of Treatment: 3 Clustered Wound: No Photos Wound Measurements Length: (cm) 0.4 Width: (cm) 0.4 Depth: (cm) 0.1 Area: (cm) 0.126 Volume: (cm) 0.013 % Reduction in Area:  -1475% % Reduction in Volume: -1200% Epithelialization: None Tunneling: No Undermining: No Wound Description Classification: Partial Thickness Exudate Amount: None Present Foul Odor After Cleansing: No Slough/Fibrino Yes Wound Bed Granulation Amount: Small (1-33%) Exposed Structure Granulation Quality: Red Fascia Exposed: No Necrotic Amount: Large (67-100%) Fat Layer (Subcutaneous Tissue) Exposed: No Necrotic Quality: Adherent Slough  Tendon Exposed: No Muscle Exposed: No Joint Exposed: No Bone Exposed: No Treatment Notes Wound #12 (Wrist) Wound Laterality: Right Cleanser Peri-Wound Care AandD Ointment Discharge Instruction: Apply AandD Ointment as directed Topical Hylton, Natacia K. ([id number]) Primary Dressing Telfa Non-adherent Dressing, 2x3 (in/in) Discharge Instruction: Add to wound bed to alleviate sticking. Secondary Dressing Secured With Compression Wrap Compression Stockings Add-Ons Electronic Signature(s) Signed: 02/01/2020 12:18:16 PM By: Phillis HaggisSanchez Pereyda, Dondra PraderKenia RN Entered By: Phillis HaggisSanchez Pereyda, Kenia on 02/01/2020 11:16:45 Danae OrleansBYRUM, Nawaal K. ([id number]) -------------------------------------------------------------------------------- Wound Assessment Details Patient Name: Danae OrleansBYRUM, Genean K. Date of Service: 02/01/2020 11:00 AM Medical Record Number: [id number] Patient Account Number: [id number] Date of Birth/Sex: Jan 20, 1966 (55 y.o. F) Treating RN: Rogers BlockerSanchez, Kenia Primary Care Jolly Carlini: Einar CrowAnderson, Marshall Other Clinician: Referring Kerline Trahan: Einar CrowAnderson, Marshall Treating Maxamilian Amadon/Extender: Rowan BlaseStone, Hoyt Weeks in Treatment: 3 Wound Status Wound Number: 9 Primary Etiology: Skin Tear Wound Location: Proximal Back Wound Status: Open Wounding Event: Skin Tear/Laceration Comorbid History: Sleep Apnea, Quadriplegia Date Acquired: 10/22/2019 Weeks Of Treatment: 3 Clustered Wound: No Wound under treatment by Mohab Ashby outside of Wound Center Photos Wound  Measurements Length: (cm) 1 Width: (cm) 0.8 Depth: (cm) 0.1 Area: (cm) 0.628 Volume: (cm) 0.063 % Reduction in Area: 11.2% % Reduction in Volume: 11.3% Epithelialization: None Tunneling: No Undermining: No Wound Description Classification: Full Thickness Without Exposed Support Structures Exudate Amount: Medium Exudate Type: Sanguinous Exudate Color: red Foul Odor After Cleansing: No Slough/Fibrino No Wound Bed Granulation Amount: Large (67-100%) Exposed Structure Granulation Quality: Red Fascia Exposed: No Necrotic Amount: None Present (0%) Fat Layer (Subcutaneous Tissue) Exposed: Yes Tendon Exposed: No Muscle Exposed: No Joint Exposed: No Bone Exposed: No Treatment Notes Wound #9 (Back) Wound Laterality: Proximal Cleanser Peri-Wound Care Topical Danae OrleansBYRUM, Dalal K. ([id number]) Primary Dressing Hydrofera Blue Ready Transfer Foam, 2.5x2.5 (in/in) Discharge Instruction: Apply Hydrofera Blue Ready to wound bed as directed Secondary Dressing Telfa Adhesive Island Dressing, 4x4 (in/in) Discharge Instruction: Apply over dressing to secure in place. Secured With Compression Wrap Compression Stockings Facilities managerAdd-Ons Electronic Signature(s) Signed: 02/01/2020 12:18:16 PM By: Phillis HaggisSanchez Pereyda, Dondra PraderKenia RN Entered By: Phillis HaggisSanchez Pereyda, Kenia on 02/01/2020 11:17:17 Danae OrleansBYRUM, Ladell K. ([id number]) -------------------------------------------------------------------------------- Vitals Details Patient Name: Danae OrleansBYRUM, Mairead K. Date of Service: 02/01/2020 11:00 AM Medical Record Number: [id number] Patient Account Number: [id number] Date of Birth/Sex: Jan 20, 1966 (55 y.o. F) Treating RN: Rogers BlockerSanchez, Kenia Primary Care Amaryllis Malmquist: Einar CrowAnderson, Marshall Other Clinician: Referring Rosealynn Mateus: Einar CrowAnderson, Marshall Treating Lorenz Donley/Extender: Rowan BlaseStone, Hoyt Weeks in Treatment: 3 Vital Signs Time Taken: 11:03 Temperature (F): 98.2 Pulse (bpm): 73 Respiratory Rate (breaths/min): 18 Blood Pressure (mmHg):  121/84 Reference Range: 80 - 120 mg / dl Electronic Signature(s) Signed: 02/01/2020 12:18:16 PM By: Phillis HaggisSanchez Pereyda, Dondra PraderKenia RN Entered By: Phillis HaggisSanchez Pereyda, Dondra PraderKenia on 02/01/2020 11:05:24

## 2020-02-22 ENCOUNTER — Other Ambulatory Visit: Payer: Self-pay

## 2020-02-22 ENCOUNTER — Other Ambulatory Visit (HOSPITAL_COMMUNITY)
Admission: RE | Admit: 2020-02-22 | Discharge: 2020-02-22 | Disposition: A | Discharge status: Home / Self Care | Payer: Medicare Other | Source: Ambulatory Visit | Attending: Obstetrics and Gynecology | Admitting: Obstetrics and Gynecology

## 2020-02-22 ENCOUNTER — Ambulatory Visit (INDEPENDENT_AMBULATORY_CARE_PROVIDER_SITE_OTHER): Payer: Medicare Other | Admitting: Obstetrics and Gynecology

## 2020-02-22 ENCOUNTER — Encounter: Payer: Self-pay | Admitting: Obstetrics and Gynecology

## 2020-02-22 VITALS — BP 114/70 | HR 86 | Wt 76.0 lb

## 2020-02-22 DIAGNOSIS — R8290 Unspecified abnormal findings in urine: Secondary | ICD-10-CM | POA: Diagnosis not present

## 2020-02-22 DIAGNOSIS — N898 Other specified noninflammatory disorders of vagina: Secondary | ICD-10-CM | POA: Diagnosis not present

## 2020-02-22 NOTE — Addendum Note (Signed)
Addended by: Dorian Pod on: 02/22/2020 03:27 PM   Modules accepted: Orders

## 2020-02-22 NOTE — Progress Notes (Signed)
HPI:      [name] is a 55 y.o. G0P0000 who LMP was No LMP recorded. Patient is postmenopausal.  Subjective:   She presents today  -her caregiver states that she has had urinary odor for more than 1 month.  She says the odor is similar to stool.  It is unknown whether the odor is from the vagina or the urine.  Patient is unable to give a successful clean-catch urine. (She wears diapers constantly)    Hx: The following portions of the patient's history were reviewed and updated as appropriate:             She  has a past medical history of Acne, Cerebral palsy (HCC), Mental retardation, Myopia, Scoliosis, and Seizure disorder (HCC). She does not have any pertinent problems on file. She  has a past surgical history that includes Partial hip arthroplasty and Revision total knee arthroplasty. Her family history is not on file. She  reports that she has never smoked. She has never used smokeless tobacco. She reports that she does not drink alcohol and does not use drugs. She has a current medication list which includes the following prescription(s): aspirin, azelastine, baclofen, carbamazepine, cetirizine, cholecalciferol, dicyclomine, furosemide, hydroxyzine, iron polysaccharides, levothyroxine, lorazepam, metoclopramide, mirtazapine, multivitamin, olanzapine, omeprazole, poly fe cmplx-fehempoly-fa-b12, polysacch fe cmp-fe heme poly, potassium chloride, eucerin, sucralfate, sulfacetamide sodium (acne), and UNABLE TO FIND. She is allergic to amantadines.       Review of Systems:  Review of Systems  Constitutional: Denied constitutional symptoms, night sweats, recent illness, fatigue, fever, insomnia and weight loss.  Eyes: Denied eye symptoms, eye pain, photophobia, vision change and visual disturbance.  Ears/Nose/Throat/Neck: Denied ear, nose, throat or neck symptoms, hearing loss, nasal discharge, sinus congestion and sore throat.  Cardiovascular: Denied cardiovascular symptoms,  arrhythmia, chest pain/pressure, edema, exercise intolerance, orthopnea and palpitations.  Respiratory: Denied pulmonary symptoms, asthma, pleuritic pain, productive sputum, cough, dyspnea and wheezing.  Gastrointestinal: Denied, gastro-esophageal reflux, melena, nausea and vomiting.  Genitourinary: See HPI for additional information.  Musculoskeletal: Denied musculoskeletal symptoms, stiffness, swelling, muscle weakness and myalgia.  Dermatologic: Denied dermatology symptoms, rash and scar.  Neurologic: Denied neurology symptoms, dizziness, headache, neck pain and syncope.  Psychiatric: Denied psychiatric symptoms, anxiety and depression.  Endocrine: Denied endocrine symptoms including hot flashes and night sweats.   Meds:   Current Outpatient Medications on File Prior to Visit  Medication Sig Dispense Refill  . aspirin 325 MG EC tablet Take 325 mg by mouth daily.    Marland Kitchen azelastine (OPTIVAR) 0.05 % ophthalmic solution Place 1 drop into both eyes 2 (two) times daily.    . baclofen (LIORESAL) 10 MG tablet Take 20 mg by mouth 3 (three) times daily.     . carbamazepine (TEGRETOL XR) 200 MG 12 hr tablet Take 100 mg by mouth 2 (two) times daily.     . cetirizine (ZYRTEC) 10 MG chewable tablet Chew 10 mg by mouth as needed.     . cholecalciferol (VITAMIN D) 1000 units tablet Take 1,250 Units by mouth every other day.     . dicyclomine (BENTYL) 10 MG capsule Take 10 mg by mouth 4 (four) times daily -  before meals and at bedtime.    . furosemide (LASIX) 20 MG tablet Take 20 mg by mouth as needed.     . hydrOXYzine (ATARAX/VISTARIL) 25 MG tablet Take by mouth.    . iron polysaccharides (IFEREX 150) 150 MG capsule Take 1 capsule by mouth daily.    Marland Kitchen  levothyroxine (SYNTHROID, LEVOTHROID) 100 MCG tablet Take 100 mcg by mouth daily before breakfast.    . LORazepam (ATIVAN) 1 MG tablet Take 1 mg by mouth as needed.     . metoCLOPramide (REGLAN) 5 MG tablet Take 5 mg by mouth 4 (four) times daily.    .  mirtazapine (REMERON) 7.5 MG tablet Take 7.5 mg by mouth at bedtime.    . Multiple Vitamin (MULTIVITAMIN) capsule Take 1 capsule by mouth daily.    Marland Kitchen OLANZapine (ZYPREXA) 5 MG tablet Take 5 mg by mouth as needed.     Marland Kitchen omeprazole (PRILOSEC) 20 MG capsule Take 20 mg by mouth 2 (two) times daily.     . Poly Fe Cmplx-FeHemPoly-FA-B12 22-6-1-0.025 MG TABS Take 1 tablet by mouth.    Marland Kitchen POLYSACCH FE CMP-FE HEME POLY PO Take 150 mg by mouth daily.    . potassium chloride (K-DUR,KLOR-CON) 10 MEQ tablet Take 10 mEq by mouth 2 (two) times daily.    . Skin Protectants, Misc. (EUCERIN) cream Apply topically as needed for dry skin.    Marland Kitchen sucralfate (CARAFATE) 1 GM/10ML suspension Take 1 g by mouth 4 (four) times daily -  with meals and at bedtime.    . Sulfacetamide Sodium, Acne, (KLARON) 10 % LOTN Apply topically 2 (two) times daily.     Marland Kitchen UNABLE TO FIND Med Name: CBD Oil 500mg /76mL.  1 dropper under tongue BID.     No current facility-administered medications on file prior to visit.          Objective:     Vitals:   02/22/20 1413  BP: 114/70  Pulse: 86   Filed Weights   02/22/20 1413  Weight: 76 lb (34.5 kg)              Patient unable to comply with examination.  External vaginal examination and examination of introitus reveals no obvious significant discharge.  No erythema or irritation noted.   Discharge noted on diaper not particularly consistent with evidence of bowel involvement/fistula.  Dark urine?  Assessment:    G0P0000 Patient Active Problem List   Diagnosis Date Noted  . Mental retardation 02/21/2015  . Seizure disorder, primary (HCC) 02/21/2015  . Menopause 02/21/2015  . Abnormal liver function tests 02/21/2015     1. Vaginal discharge   2. Abnormal urine odor        Plan:            1.  We will presumptively treat for UTI with Macrobid for 7 days.  2.  Nuswab vagina sent  3.  If urinary issues continue consider follow-up with patient's urologist. Orders No  orders of the defined types were placed in this encounter.   No orders of the defined types were placed in this encounter.     F/U  Return for We will contact her with any abnormal test results. I spent 22 minutes involved in the care of this patient preparing to see the patient by obtaining and reviewing her medical history (including labs, imaging tests and prior procedures), documenting clinical information in the electronic health record (EHR), counseling and coordinating care plans, writing and sending prescriptions, ordering tests or procedures and directly communicating with the patient by discussing pertinent items from her history and physical exam as well as detailing my assessment and plan as noted above so that she has an informed understanding.  All of her questions were answered.  02/23/2015, M.D. 02/22/2020 2:35 PM

## 2020-02-24 ENCOUNTER — Encounter: Payer: Medicare Other | Attending: Physician Assistant | Admitting: Physician Assistant

## 2020-02-24 ENCOUNTER — Other Ambulatory Visit: Payer: Self-pay

## 2020-02-24 DIAGNOSIS — F0390 Unspecified dementia without behavioral disturbance: Secondary | ICD-10-CM | POA: Insufficient documentation

## 2020-02-24 DIAGNOSIS — L89312 Pressure ulcer of right buttock, stage 2: Secondary | ICD-10-CM | POA: Insufficient documentation

## 2020-02-24 DIAGNOSIS — F72 Severe intellectual disabilities: Secondary | ICD-10-CM | POA: Insufficient documentation

## 2020-02-24 DIAGNOSIS — R532 Functional quadriplegia: Secondary | ICD-10-CM | POA: Insufficient documentation

## 2020-02-24 DIAGNOSIS — G40909 Epilepsy, unspecified, not intractable, without status epilepticus: Secondary | ICD-10-CM | POA: Diagnosis not present

## 2020-02-24 DIAGNOSIS — G809 Cerebral palsy, unspecified: Secondary | ICD-10-CM | POA: Diagnosis not present

## 2020-02-24 LAB — CERVICOVAGINAL ANCILLARY ONLY
Bacterial Vaginitis (gardnerella): NEGATIVE
Candida Glabrata: NEGATIVE
Candida Vaginitis: NEGATIVE
Chlamydia: NEGATIVE
Comment: NEGATIVE
Comment: NEGATIVE
Comment: NEGATIVE
Comment: NEGATIVE
Comment: NEGATIVE
Comment: NORMAL
Neisseria Gonorrhea: NEGATIVE
Trichomonas: NEGATIVE

## 2020-02-24 NOTE — Progress Notes (Signed)
MARLEANA, ALMQUIST ([id number]) Visit Report for 02/24/2020 Arrival Information Details Patient Name: CAYA, HOFMANN. Date of Service: 02/24/2020 11:00 AM Medical Record Number: [id number] Patient Account Number: [id number] Date of Birth/Sex: 1965-04-18 (55 y.o. F) Treating RN: Rogers Blocker Primary Care Claudio Mondry: Einar Crow Other Clinician: Referring Elijio Staples: Einar Crow Treating Charisa Twitty/Extender: Rowan Blase in Treatment: 6 Visit Information History Since Last Visit Added or deleted any medications: No Patient Arrived: Wheel Chair Any new allergies or adverse reactions: No Arrival Time: 11:08 Had a fall or experienced change in No Accompanied By: caregiver activities of daily living that may affect Transfer Assistance: Manual risk of falls: Patient Identification Verified: Yes Signs or symptoms of abuse/neglect since last visito No Secondary Verification Process Completed: Yes Hospitalized since last visit: No Implantable device outside of the clinic excluding No cellular tissue based products placed in the center since last visit: Has Dressing in Place as Prescribed: Yes Pain Present Now: No Electronic Signature(s) Signed: 02/24/2020 3:36:58 PM By: Dayton Martes RCP, RRT, CHT Entered By: Dayton Martes on 02/24/2020 11:09:04 [name] ([id number]) -------------------------------------------------------------------------------- Clinic Level of Care Assessment Details Patient Name: [name]. Date of Service: 02/24/2020 11:00 AM Medical Record Number: [id number] Patient Account Number: [id number] Date of Birth/Sex: June 28, 1965 (55 y.o. F) Treating RN: Rogers Blocker Primary Care Coree Riester: Einar Crow Other Clinician: Referring Sheyanne Munley: Einar Crow Treating Melessia Kaus/Extender: Rowan Blase in Treatment: 6 Clinic Level of Care Assessment Items TOOL 4 Quantity Score X - Use when only an EandM is  performed on FOLLOW-UP visit 1 0 ASSESSMENTS - Nursing Assessment / Reassessment X - Reassessment of Co-morbidities (includes updates in patient status) 1 10 X- 1 5 Reassessment of Adherence to Treatment Plan ASSESSMENTS - Wound and Skin Assessment / Reassessment []  - Simple Wound Assessment / Reassessment - one wound 0 []  - 0 Complex Wound Assessment / Reassessment - multiple wounds []  - 0 Dermatologic / Skin Assessment (not related to wound area) ASSESSMENTS - Focused Assessment []  - Circumferential Edema Measurements - multi extremities 0 []  - 0 Nutritional Assessment / Counseling / Intervention []  - 0 Lower Extremity Assessment (monofilament, tuning fork, pulses) []  - 0 Peripheral Arterial Disease Assessment (using hand held doppler) ASSESSMENTS - Ostomy and/or Continence Assessment and Care []  - Incontinence Assessment and Management 0 []  - 0 Ostomy Care Assessment and Management (repouching, etc.) PROCESS - Coordination of Care X - Simple Patient / Family Education for ongoing care 1 15 []  - 0 Complex (extensive) Patient / Family Education for ongoing care []  - 0 Staff obtains Chiropractor, Records, Test Results / Process Orders []  - 0 Staff telephones HHA, Nursing Homes / Clarify orders / etc []  - 0 Routine Transfer to another Facility (non-emergent condition) []  - 0 Routine Hospital Admission (non-emergent condition) []  - 0 New Admissions / Manufacturing engineer / Ordering NPWT, Apligraf, etc. []  - 0 Emergency Hospital Admission (emergent condition) X- 1 10 Simple Discharge Coordination []  - 0 Complex (extensive) Discharge Coordination PROCESS - Special Needs []  - Pediatric / Minor Patient Management 0 []  - 0 Isolation Patient Management []  - 0 Hearing / Language / Visual special needs []  - 0 Assessment of Community assistance (transportation, D/C planning, etc.) []  - 0 Additional assistance / Altered mentation []  - 0 Support Surface(s) Assessment (bed,  cushion, seat, etc.) INTERVENTIONS - Wound Cleansing / Measurement Sarvis, Lakysha K. ([id number]) []  - 0 Simple Wound Cleansing - one wound []  - 0 Complex Wound Cleansing - multiple wounds []  -  0 Wound Imaging (photographs - any number of wounds) []  - 0 Wound Tracing (instead of photographs) []  - 0 Simple Wound Measurement - one wound []  - 0 Complex Wound Measurement - multiple wounds INTERVENTIONS - Wound Dressings []  - Small Wound Dressing one or multiple wounds 0 []  - 0 Medium Wound Dressing one or multiple wounds []  - 0 Large Wound Dressing one or multiple wounds []  - 0 Application of Medications - topical []  - 0 Application of Medications - injection INTERVENTIONS - Miscellaneous []  - External ear exam 0 []  - 0 Specimen Collection (cultures, biopsies, blood, body fluids, etc.) []  - 0 Specimen(s) / Culture(s) sent or taken to Lab for analysis []  - 0 Patient Transfer (multiple staff / / Similar devices) []  - 0 Simple Staple / Suture removal (25 or less) []  - 0 Complex Staple / Suture removal (26 or more) []  - 0 Hypo / Hyperglycemic Management (close monitor of Blood Glucose) []  - 0 Ankle / Brachial Index (ABI) - do not check if billed separately X- 1 5 Vital Signs Has the patient been seen at the hospital within the last three years: Yes Total Score: 45 Level Of Care: New/Established - Level 2 Electronic Signature(s) Signed: 02/24/2020 12:35:51 PM By: , RN Entered By: , on 02/24/2020 11:52:02 ( ) -------------------------------------------------------------------------------- Encounter Discharge Information Details Patient Name: . Date of Service: 02/24/2020 11:00 AM Medical Record Number: Patient Account Number: Nurse, adult Date of Birth/Sex: 05-14-1965 (55 y.o. F) Treating RN: Primary Care Eriyonna Matsushita: Other  Clinician: Referring Burton Gahan: 04/23/2020 Treating Kayna Suppa/Extender: Phillis Haggis in Treatment: 6 Encounter Discharge Information Items Discharge Condition: Stable Ambulatory Status: Wheelchair Discharge Destination: Home Transportation: Private Auto Accompanied By: self Schedule Follow-up Appointment: Yes Clinical Summary of Care: Patient Declined Electronic Signature(s) Signed: 02/24/2020 12:35:51 PM By: Phillis Haggis, Dondra Prader RN Entered By: 04/23/2020, [name] on 02/24/2020 11:48:56 [name] (04/23/2020) -------------------------------------------------------------------------------- Lower Extremity Assessment Details Patient Name: [id number]. Date of Service: 02/24/2020 11:00 AM Medical Record Number: 10/23/1965 Patient Account Number: [id number] Date of Birth/Sex: July 19, 1965 (55 y.o. F) Treating RN: Einar Crow Primary Care Bessie Livingood: Rowan Blase Other Clinician: Referring Nadya Hopwood: 04/23/2020 Treating Rubylee Zamarripa/Extender: Phillis Haggis in Treatment: 6 Electronic Signature(s) Signed: 02/24/2020 4:28:47 PM By: Phillis Haggis RN Entered By: Dondra Prader on 02/24/2020 11:22:42 [name] ([id number]) -------------------------------------------------------------------------------- Multi Wound Chart Details Patient Name: [name]. Date of Service: 02/24/2020 11:00 AM Medical Record Number: [id number] Patient Account Number: [id number] Date of Birth/Sex: 08-20-1965 (55 y.o. F) Treating RN: Yevonne Pax Primary Care Maveric Debono: Einar Crow Other Clinician: Referring Loel Betancur: Einar Crow Treating Carime Dinkel/Extender: Rowan Blase in Treatment: 6 Vital Signs Height(in): Pulse(bpm): 80 Weight(lbs): Blood Pressure(mmHg): 108/71 Body Mass Index(BMI): Temperature(F): 98.2 Respiratory Rate(breaths/min): 18 Photos: [N/A:N/A] Wound Location: Right Wrist Proximal Back N/A Wounding Event: Skin  Tear/Laceration Skin Tear/Laceration N/A Primary Etiology: Skin Tear Skin Tear N/A Comorbid History: Sleep Apnea, Quadriplegia Sleep Apnea, Quadriplegia N/A Date Acquired: 10/22/2019 10/22/2019 N/A Weeks of Treatment: 6 6 N/A Wound Status: Open Open N/A Measurements L x W x D (cm) 0x0x0 0x0x0 N/A Area (cm) : 0 0 N/A Volume (cm) : 0 0 N/A % Reduction in Area: 100.00% 100.00% N/A % Reduction in Volume: 100.00% 100.00% N/A Classification: Partial Thickness Full Thickness Without Exposed N/A Support Structures Exudate Amount: None Present None Present N/A Granulation Amount: None Present (0%) None Present (0%) N/A Necrotic Amount: None Present (  0%) None Present (0%) N/A Epithelialization: None None N/A Treatment Notes Electronic Signature(s) Signed: 02/24/2020 12:35:51 PM By: Phillis Haggis, Dondra Prader RN Entered By: Phillis Haggis, Kenia on 02/24/2020 11:43:39 [name] ([id number]) -------------------------------------------------------------------------------- Multi-Disciplinary Care Plan Details Patient Name: NEETI, KNUDTSON. Date of Service: 02/24/2020 11:00 AM Medical Record Number: [id number] Patient Account Number: [id number] Date of Birth/Sex: Jul 30, 1965 (55 y.o. F) Treating RN: Rogers Blocker Primary Care Dee Paden: Einar Crow Other Clinician: Referring Tyquan Carmickle: Einar Crow Treating Jakyria Bleau/Extender: Rowan Blase in Treatment: 6 Active Inactive Electronic Signature(s) Signed: 02/24/2020 12:35:51 PM By: Phillis Haggis, Dondra Prader RN Entered By: Phillis Haggis, Dondra Prader on 02/24/2020 11:43:32 [name] ([id number]) -------------------------------------------------------------------------------- Pain Assessment Details Patient Name: [name]. Date of Service: 02/24/2020 11:00 AM Medical Record Number: [id number] Patient Account Number: [id number] Date of Birth/Sex: 29-Sep-1965 (55 y.o. F) Treating RN: Yevonne Pax Primary Care Estelle Greenleaf:  Einar Crow Other Clinician: Referring Puneet Selden: Einar Crow Treating Tannisha Kennington/Extender: Rowan Blase in Treatment: 6 Active Problems Location of Pain Severity and Description of Pain Patient Has Paino No Site Locations Pain Management and Medication Current Pain Management: Electronic Signature(s) Signed: 02/24/2020 4:28:47 PM By: Yevonne Pax RN Entered By: Yevonne Pax on 02/24/2020 11:16:59 [name] ([id number]) -------------------------------------------------------------------------------- Patient/Caregiver Education Details Patient Name: [name]. Date of Service: 02/24/2020 11:00 AM Medical Record Number: [id number] Patient Account Number: [id number] Date of Birth/Gender: 03-Mar-1965 (55 y.o. F) Treating RN: Rogers Blocker Primary Care Physician: Einar Crow Other Clinician: Referring Physician: Einar Crow Treating Physician/Extender: Rowan Blase in Treatment: 6 Education Assessment Education Provided To: Caregiver Education Topics Provided Notes Protect new skin Electronic Signature(s) Signed: 02/24/2020 12:35:51 PM By: Phillis Haggis, Dondra Prader RN Entered By: Phillis Haggis, Dondra Prader on 02/24/2020 11:52:34 [name] ([id number]) -------------------------------------------------------------------------------- Wound Assessment Details Patient Name: [name]. Date of Service: 02/24/2020 11:00 AM Medical Record Number: [id number] Patient Account Number: [id number] Date of Birth/Sex: 1966-01-20 (55 y.o. F) Treating RN: Yevonne Pax Primary Care Leira Regino: Einar Crow Other Clinician: Referring Iva Montelongo: Einar Crow Treating Tita Terhaar/Extender: Rowan Blase in Treatment: 6 Wound Status Wound Number: 12 Primary Etiology: Skin Tear Wound Location: Right Wrist Wound Status: Open Wounding Event: Skin Tear/Laceration Comorbid History: Sleep Apnea, Quadriplegia Date Acquired: 10/22/2019 Weeks  Of Treatment: 6 Clustered Wound: No Photos Wound Measurements Length: (cm) 0 Width: (cm) 0 Depth: (cm) 0 Area: (cm) 0 Volume: (cm) 0 % Reduction in Area: 100% % Reduction in Volume: 100% Epithelialization: None Tunneling: No Undermining: No Wound Description Classification: Partial Thickness Exudate Amount: None Present Foul Odor After Cleansing: No Slough/Fibrino No Wound Bed Granulation Amount: None Present (0%) Necrotic Amount: None Present (0%) Electronic Signature(s) Signed: 02/24/2020 4:28:47 PM By: Yevonne Pax RN Entered By: Yevonne Pax on 02/24/2020 11:22:02 [place]leton, [name] ([id number]) -------------------------------------------------------------------------------- Wound Assessment Details Patient Name: [name]. Date of Service: 02/24/2020 11:00 AM Medical Record Number: [id number] Patient Account Number: [id number] Date of Birth/Sex: 1965/04/12 (55 y.o. F) Treating RN: Yevonne Pax Primary Care Danasia Baker: Einar Crow Other Clinician: Referring Chealsea Paske: Einar Crow Treating King Pinzon/Extender: Rowan Blase in Treatment: 6 Wound Status Wound Number: 9 Primary Etiology: Skin Tear Wound Location: Proximal Back Wound Status: Open Wounding Event: Skin Tear/Laceration Comorbid History: Sleep Apnea, Quadriplegia Date Acquired: 10/22/2019 Weeks Of Treatment: 6 Clustered Wound: No Wound under treatment by Sary Bogie outside of Wound Center Photos Wound Measurements Length: (cm) 0 Width: (cm) 0 Depth: (cm) 0 Area: (cm) 0 Volume: (cm) 0 % Reduction in Area: 100% % Reduction in Volume: 100% Epithelialization: None Tunneling:  No Undermining: No Wound Description Classification: Full Thickness Without Exposed Support Structures Exudate Amount: None Present Foul Odor After Cleansing: No Slough/Fibrino No Wound Bed Granulation Amount: None Present (0%) Exposed Structure Necrotic Amount: None Present (0%) Fascia Exposed: No Fat  Layer (Subcutaneous Tissue) Exposed: Yes Tendon Exposed: No Muscle Exposed: No Joint Exposed: No Bone Exposed: No Electronic Signature(s) Signed: 02/24/2020 4:28:47 PM By: Yevonne Pax RN Entered By: Yevonne Pax on 02/24/2020 11:22:31 Housh, [name] ([id number]) -------------------------------------------------------------------------------- Vitals Details Patient Name: [name]. Date of Service: 02/24/2020 11:00 AM Medical Record Number: [id number] Patient Account Number: [id number] Date of Birth/Sex: 11-23-1965 (55 y.o. F) Treating RN: Rogers Blocker Primary Care Avon Molock: Einar Crow Other Clinician: Referring Krisna Omar: Einar Crow Treating Serafin Decatur/Extender: Rowan Blase in Treatment: 6 Vital Signs Time Taken: 11:09 Temperature (F): 98.2 Pulse (bpm): 80 Respiratory Rate (breaths/min): 18 Blood Pressure (mmHg): 108/71 Reference Range: 80 - 120 mg / dl Electronic Signature(s) Signed: 02/24/2020 3:36:58 PM By: Dayton Martes RCP, RRT, CHT Entered By: Dayton Martes on 02/24/2020 11:09:56
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
## 2020-02-25 ENCOUNTER — Other Ambulatory Visit: Payer: Self-pay

## 2020-02-25 ENCOUNTER — Telehealth: Payer: Self-pay

## 2020-02-25 MED ORDER — NITROFURANTOIN MONOHYD MACRO 100 MG PO CAPS: 100.0000 mg | ORAL_CAPSULE | Freq: Two times a day (BID) | ORAL | 0 refills | Status: DC

## 2020-02-25 NOTE — Telephone Encounter (Signed)
Called no answer LM via VM that her daughter's medication Macrobid was sent to [facility] pharmacy. All information concerning the medication was left on vm directions and mg also included office name, phone number, contact name if had any questions.

## 2020-02-25 NOTE — Telephone Encounter (Signed)
Patients caregiver called in stating that [facility] never received the fax for this patients Rx. Could you please advise?

## 2020-03-08 IMAGING — CR CHEST - 2 VIEW
1 series · 3 of 3 positions shown · non-contrast
Comparison: None.

CLINICAL DATA: Cough and fever

EXAM:
CHEST - 2 VIEW

[Series 1: w chest lat · 0.14mm/px · 3 of 3 slices shown]
[im 1/3]
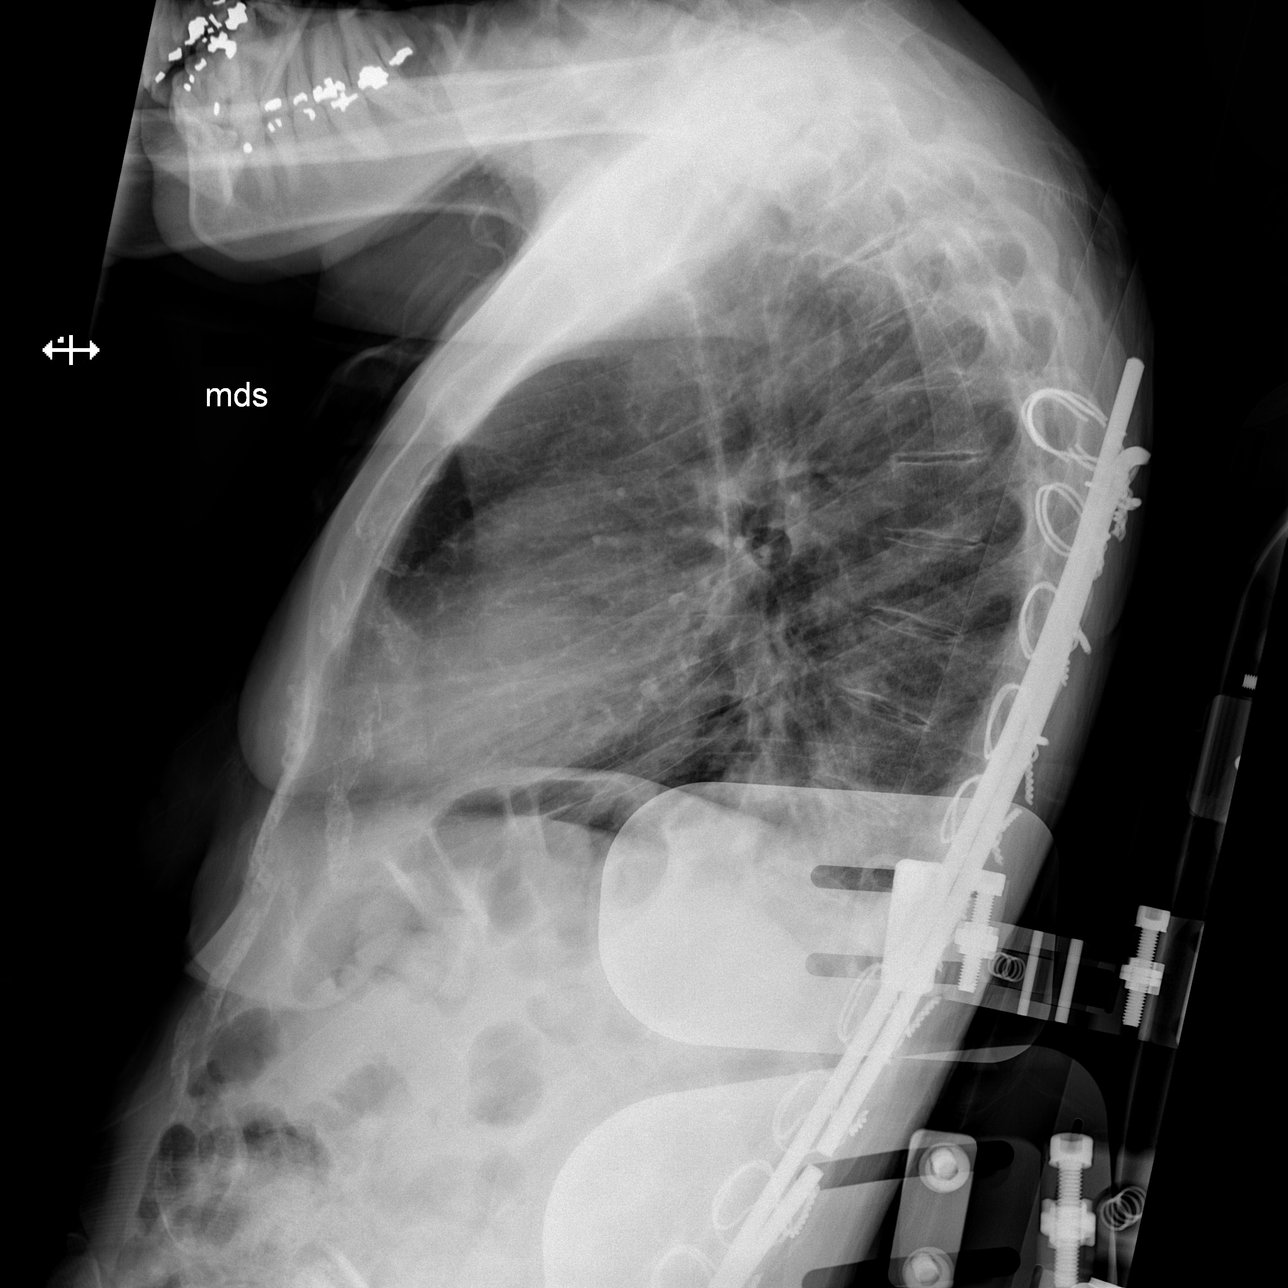
[im 2/3]
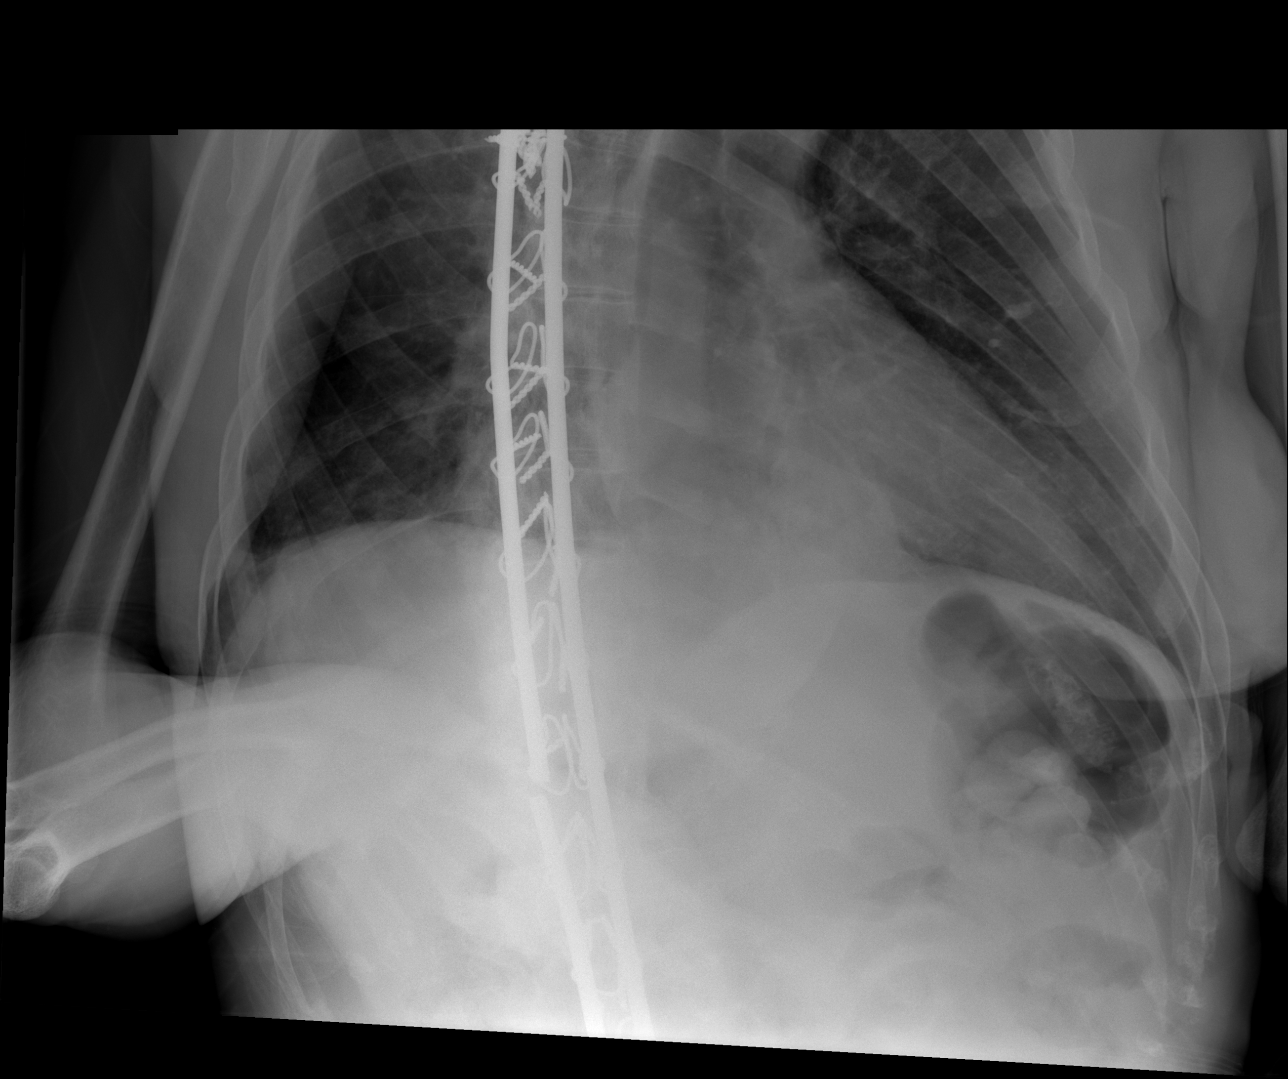
[im 3/3]
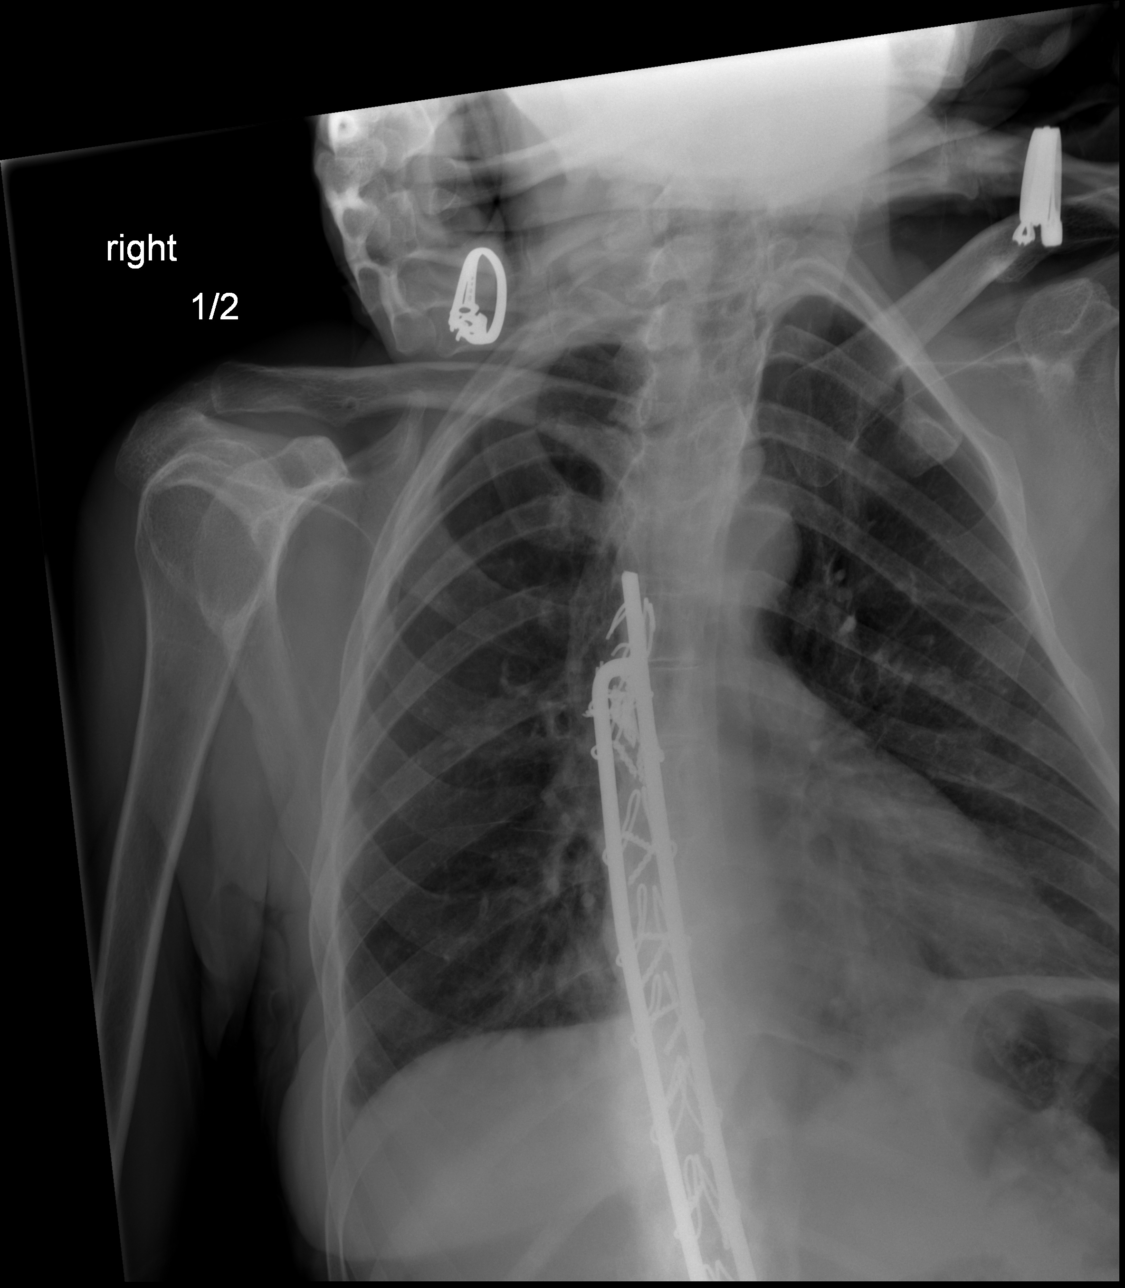

[3 of 3 positions shown; findings below may reference images not displayed]

FINDINGS: Moderate dextroscoliosis. Thoracic fusion rods posteriorly. Right
rod is fractured at approximately T12.

Negative for heart failure or pneumonia.  Minimal right effusion.
IMPRESSION: Negative for pneumonia.  Minimal right effusion

Scoliosis and prior thoracic and lumbar rod fixation. Fracture of
the right rod at approximately the T12 level. No prior studies for
comparison.

## 2020-08-28 NOTE — Patient Instructions (Signed)
Breast Self-Awareness Breast self-awareness is knowing how your breasts look and feel. Doing breast self-awareness is important. It allows you to catch a breast problem early while it is still small and can be treated. All women should do breast self-awareness, including women who have had breast implants. Tell your doctorif you notice a change in your breasts. What you need: A mirror. A well-lit room. How to do a breast self-exam A breast self-exam is one way to learn what is normal for your breasts and tocheck for changes. To do a breast self-exam: Look for changes  Take off all the clothes above your waist. Stand in front of a mirror in a room with good lighting. Put your hands on your hips. Push your hands down. Look at your breasts and nipples in the mirror to see if one breast or nipple looks different from the other. Check to see if: The shape of one breast is different. The size of one breast is different. There are wrinkles, dips, and bumps in one breast and not the other. Look at each breast for changes in the skin, such as: Redness. Scaly areas. Look for changes in your nipples, such as: Liquid around the nipples. Bleeding. Dimpling. Redness. A change in where the nipples are.  Feel for changes  Lie on your back on the floor. Feel each breast. To do this, follow these steps: Pick a breast to feel. Put the arm closest to that breast above your head. Use your other arm to feel the nipple area of your breast. Feel the area with the pads of your three middle fingers by making small circles with your fingers. For the first circle, press lightly. For the second circle, press harder. For the third circle, press even harder. Keep making circles with your fingers at the different pressures as you move down your breast. Stop when you feel your ribs. Move your fingers a little toward the center of your body. Start making circles with your fingers again, this time going up until  you reach your collarbone. Keep making up-and-down circles until you reach your armpit. Remember to keep using the three pressures. Feel the other breast in the same way. Sit or stand in the tub or shower. With soapy water on your skin, feel each breast the same way you did in step 2 when you were lying on the floor.  Write down what you find Writing down what you find can help you remember what to tell your doctor. Write down: What is normal for each breast. Any changes you find in each breast, including: The kind of changes you find. Whether you have pain. Size and location of any lumps. When you last had your menstrual period. General tips Check your breasts every month. If you are breastfeeding, the best time to check your breasts is after you feed your baby or after you use a breast pump. If you get menstrual periods, the best time to check your breasts is 5-7 days after your menstrual period is over. With time, you will become comfortable with the self-exam, and you will begin to know if there are changes in your breasts. Contact a doctor if you: See a change in the shape or size of your breasts or nipples. See a change in the skin of your breast or nipples, such as red or scaly skin. Have fluid coming from your nipples that is not normal. Find a lump or thick area that was not there before. Have pain in   your breasts. Have any concerns about your breast health. Summary Breast self-awareness includes looking for changes in your breasts, as well as feeling for changes within your breasts. Breast self-awareness should be done in front of a mirror in a well-lit room. You should check your breasts every month. If you get menstrual periods, the best time to check your breasts is 5-7 days after your menstrual period is over. Let your doctor know of any changes you see in your breasts, including changes in size, changes on the skin, pain or tenderness, or fluid from your nipples that is  not normal. This information is not intended to replace advice given to you by your health care provider. Make sure you discuss any questions you have with your healthcare provider. Document Revised: 08/26/2017 Document Reviewed: 08/26/2017 Elsevier Patient Education  2022 [employer]      Preventive Care 63-72 Years Old, Female Preventive care refers to lifestyle choices and visits with your health care provider that can promote health and wellness. This includes: A yearly physical exam. This is also called an annual wellness visit. Regular dental and eye exams. Immunizations. Screening for certain conditions. Healthy lifestyle choices, such as: Eating a healthy diet. Getting regular exercise. Not using drugs or products that contain nicotine and tobacco. Limiting alcohol use. What can I expect for my preventive care visit? Physical exam Your health care provider will check your: Height and weight. These may be used to calculate your BMI (body mass index). BMI is a measurement that tells if you are at a healthy weight. Heart rate and blood pressure. Body temperature. Skin for abnormal spots. Counseling Your health care provider may ask you questions about your: Past medical problems. Family's medical history. Alcohol, tobacco, and drug use. Emotional well-being. Home life and relationship well-being. Sexual activity. Diet, exercise, and sleep habits. Work and work Statistician. Access to firearms. Method of birth control. Menstrual cycle. Pregnancy history. What immunizations do I need?  Vaccines are usually given at various ages, according to a schedule. Your health care provider will recommend vaccines for you based on your age, medicalhistory, and lifestyle or other factors, such as travel or where you work. What tests do I need? Blood tests Lipid and cholesterol levels. These may be checked every 5 years, or more often if you are over 65 years old. Hepatitis C  test. Hepatitis B test. Screening Lung cancer screening. You may have this screening every year starting at age 9 if you have a 30-pack-year history of smoking and currently smoke or have quit within the past 15 years. Colorectal cancer screening. All adults should have this screening starting at age 1 and continuing until age 20. Your health care provider may recommend screening at age 68 if you are at increased risk. You will have tests every 1-10 years, depending on your results and the type of screening test. Diabetes screening. This is done by checking your blood sugar (glucose) after you have not eaten for a while (fasting). You may have this done every 1-3 years. Mammogram. This may be done every 1-2 years. Talk with your health care provider about when you should start having regular mammograms. This may depend on whether you have a family history of breast cancer. BRCA-related cancer screening. This may be done if you have a family history of breast, ovarian, tubal, or peritoneal cancers. Pelvic exam and Pap test. This may be done every 3 years starting at age 15. Starting at age 36, this may be done  5 years if you have a Pap test in combination with an HPV test. Other tests STD (sexually transmitted disease) testing, if you are at risk. Bone density scan. This is done to screen for osteoporosis. You may have this scan if you are at high risk for osteoporosis. Talk with your health care provider about your test results, treatment options,and if necessary, the need for more tests. Follow these instructions at home: Eating and drinking  Eat a diet that includes fresh fruits and vegetables, whole grains, lean protein, and low-fat dairy products. Take vitamin and mineral supplements as recommended by your health care provider. Do not drink alcohol if: Your health care provider tells you not to drink. You are pregnant, may be pregnant, or are planning to become pregnant. If  you drink alcohol: Limit how much you have to 0-1 drink a day. Be aware of how much alcohol is in your drink. In the [place] one drink equals one 12 oz bottle of beer (355 mL), one 5 oz glass of wine (148 mL), or one 1 oz glass of hard liquor (44 mL).  Lifestyle Take daily care of your teeth and gums. Brush your teeth every morning and night with fluoride toothpaste. Floss one time each day. Stay active. Exercise for at least 30 minutes 5 or more days each week. Do not use any products that contain nicotine or tobacco, such as cigarettes, e-cigarettes, and chewing tobacco. If you need help quitting, ask your health care provider. Do not use drugs. If you are sexually active, practice safe sex. Use a condom or other form of protection to prevent STIs (sexually transmitted infections). If you do not wish to become pregnant, use a form of birth control. If you plan to become pregnant, see your health care provider for a prepregnancy visit. If told by your health care provider, take low-dose aspirin daily starting at age 50. Find healthy ways to cope with stress, such as: Meditation, yoga, or listening to music. Journaling. Talking to a trusted person. Spending time with friends and family. Safety Always wear your seat belt while driving or riding in a vehicle. Do not drive: If you have been drinking alcohol. Do not ride with someone who has been drinking. When you are tired or distracted. While texting. Wear a helmet and other protective equipment during sports activities. If you have firearms in your house, make sure you follow all gun safety procedures. What's next? Visit your health care provider once a year for an annual wellness visit. Ask your health care provider how often you should have your eyes and teeth checked. Stay up to date on all vaccines. This information is not intended to replace advice given to you by your health care provider. Make sure you discuss any questions you  have with your healthcare provider. Document Revised: 10/12/2019 Document Reviewed: 09/18/2017 Elsevier Patient Education  2022 Elsevier Inc.  

## 2020-08-29 ENCOUNTER — Encounter: Payer: Self-pay | Admitting: Obstetrics and Gynecology

## 2020-08-29 ENCOUNTER — Other Ambulatory Visit: Payer: Self-pay

## 2020-08-29 ENCOUNTER — Ambulatory Visit (INDEPENDENT_AMBULATORY_CARE_PROVIDER_SITE_OTHER): Payer: Medicare Other | Admitting: Obstetrics and Gynecology

## 2020-08-29 VITALS — BP 112/65 | HR 79 | Ht <= 58 in | Wt 92.0 lb

## 2020-08-29 DIAGNOSIS — Z1231 Encounter for screening mammogram for malignant neoplasm of breast: Secondary | ICD-10-CM | POA: Diagnosis not present

## 2020-08-29 DIAGNOSIS — Z0000 Encounter for general adult medical examination without abnormal findings: Secondary | ICD-10-CM

## 2020-08-29 DIAGNOSIS — Z01419 Encounter for gynecological examination (general) (routine) without abnormal findings: Secondary | ICD-10-CM | POA: Diagnosis not present

## 2020-08-29 DIAGNOSIS — Z124 Encounter for screening for malignant neoplasm of cervix: Secondary | ICD-10-CM

## 2020-08-29 NOTE — Progress Notes (Signed)
HPI:      [name] is a 55 y.o. G0P0000 who LMP was No LMP recorded. Patient is postmenopausal.  Subjective:   She presents today for her annual examination.  She is severely mentally challenged and presents today with her caregiver.  Caregiver states that there are no GYN issues.  Patient is menopausal without bleeding. She wears a diaper continuously. Previous urinary issue (odor) improved briefly with antibiotics but now has returned to malodorous and dark. She is not sexually active.  She does not allow pelvic examinations.    Hx: The following portions of the patient's history were reviewed and updated as appropriate:             She  has a past medical history of Acne, Cerebral palsy (HCC), Mental retardation, Myopia, Scoliosis, and Seizure disorder (HCC). She does not have any pertinent problems on file. She  has a past surgical history that includes Partial hip arthroplasty and Revision total knee arthroplasty. Her family history is not on file. She  reports that she has never smoked. She has never used smokeless tobacco. She reports that she does not drink alcohol and does not use drugs. She has a current medication list which includes the following prescription(s): aspirin, azelastine, baclofen, carbamazepine, cetirizine, cholecalciferol, dicyclomine, furosemide, hydroxyzine, iron polysaccharides, levothyroxine, lorazepam, metoclopramide, mirtazapine, multivitamin, nitrofurantoin (macrocrystal-monohydrate), olanzapine, omeprazole, poly fe cmplx-fehempoly-fa-b12, polysacch fe cmp-fe heme poly, potassium chloride, eucerin, sucralfate, sulfacetamide sodium (acne), and UNABLE TO FIND. She is allergic to amantadines.       Review of Systems:  Review of Systems  Constitutional: Denied constitutional symptoms, night sweats, recent illness, fatigue, fever, insomnia and weight loss.  Eyes: Denied eye symptoms, eye pain, photophobia, vision change and visual disturbance.   Ears/Nose/Throat/Neck: Denied ear, nose, throat or neck symptoms, hearing loss, nasal discharge, sinus congestion and sore throat.  Cardiovascular: Denied cardiovascular symptoms, arrhythmia, chest pain/pressure, edema, exercise intolerance, orthopnea and palpitations.  Respiratory: Denied pulmonary symptoms, asthma, pleuritic pain, productive sputum, cough, dyspnea and wheezing.  Gastrointestinal: Denied, gastro-esophageal reflux, melena, nausea and vomiting.  Genitourinary: Denied genitourinary symptoms including symptomatic vaginal discharge, pelvic relaxation issues, and urinary complaints.  Musculoskeletal: Denied musculoskeletal symptoms, stiffness, swelling, muscle weakness and myalgia.  Dermatologic: Denied dermatology symptoms, rash and scar.  Neurologic: Denied neurology symptoms, dizziness, headache, neck pain and syncope.  Psychiatric: Denied psychiatric symptoms, anxiety and depression.  Endocrine: Denied endocrine symptoms including hot flashes and night sweats.   Meds:   Current Outpatient Medications on File Prior to Visit  Medication Sig Dispense Refill   aspirin 325 MG EC tablet Take 325 mg by mouth daily.     azelastine (OPTIVAR) 0.05 % ophthalmic solution Place 1 drop into both eyes 2 (two) times daily.     baclofen (LIORESAL) 10 MG tablet Take 20 mg by mouth 3 (three) times daily.      carbamazepine (TEGRETOL XR) 200 MG 12 hr tablet Take 100 mg by mouth 2 (two) times daily.      cetirizine (ZYRTEC) 10 MG chewable tablet Chew 10 mg by mouth as needed.      cholecalciferol (VITAMIN D) 1000 units tablet Take 1,250 Units by mouth every other day.      dicyclomine (BENTYL) 10 MG capsule Take 10 mg by mouth 4 (four) times daily -  before meals and at bedtime.     furosemide (LASIX) 20 MG tablet Take 20 mg by mouth as needed.      hydrOXYzine (ATARAX/VISTARIL) 25 MG tablet  Take by mouth.     iron polysaccharides (IFEREX 150) 150 MG capsule Take 1 capsule by mouth daily.      levothyroxine (SYNTHROID, LEVOTHROID) 100 MCG tablet Take 100 mcg by mouth daily before breakfast.     LORazepam (ATIVAN) 1 MG tablet Take 1 mg by mouth as needed.      metoCLOPramide (REGLAN) 5 MG tablet Take 5 mg by mouth 4 (four) times daily.     mirtazapine (REMERON) 7.5 MG tablet Take 7.5 mg by mouth at bedtime.     Multiple Vitamin (MULTIVITAMIN) capsule Take 1 capsule by mouth daily.     nitrofurantoin, macrocrystal-monohydrate, (MACROBID) 100 MG capsule Take 1 capsule (100 mg total) by mouth 2 (two) times daily. 14 capsule 0   OLANZapine (ZYPREXA) 5 MG tablet Take 5 mg by mouth as needed.      omeprazole (PRILOSEC) 20 MG capsule Take 20 mg by mouth 2 (two) times daily.      Poly Fe Cmplx-FeHemPoly-FA-B12 22-6-1-0.025 MG TABS Take 1 tablet by mouth.     POLYSACCH FE CMP-FE HEME POLY PO Take 150 mg by mouth daily.     potassium chloride (K-DUR,KLOR-CON) 10 MEQ tablet Take 10 mEq by mouth 2 (two) times daily.     Skin Protectants, Misc. (EUCERIN) cream Apply topically as needed for dry skin.     sucralfate (CARAFATE) 1 GM/10ML suspension Take 1 g by mouth 4 (four) times daily -  with meals and at bedtime.     Sulfacetamide Sodium, Acne, (KLARON) 10 % LOTN Apply topically 2 (two) times daily.      UNABLE TO FIND Med Name: CBD Oil 500mg /19mL.  1 dropper under tongue BID.     No current facility-administered medications on file prior to visit.       Objective:     There were no vitals filed for this visit.  There were no vitals filed for this visit.            Physical examination   Pelvic:   Vulva: Normal appearance.  No lesions.  Vagina: No lesions or abnormalities noted.  Support: Normal pelvic support.  Urethra No masses tenderness or scarring.  Meatus Normal size without lesions or prolapse.  Cervix:   Anus: Normal exam.  No lesions.  Perineum: Normal exam.  No lesions.        Bimanual Unable  Uterus:   Adnexae:   Cul-de-sac:      Assessment:    G0P0000 Patient  Active Problem List   Diagnosis Date Noted   Mental retardation 02/21/2015   Seizure disorder, primary (HCC) 02/21/2015   Menopause 02/21/2015   Abnormal liver function tests 02/21/2015     1. Well woman exam with routine gynecological exam   2. Breast cancer screening by mammogram     No current GYN issues involving labia or vulva.   Has malodorous dark urine   Plan:            1.  Basic Screening Recommendations The basic screening recommendations for asymptomatic women were discussed with the patient during her visit.  The age-appropriate recommendations were discussed with her and the rational for the tests reviewed.  When I am informed by the patient that another primary care physician has previously obtained the age-appropriate tests and they are up-to-date, only outstanding tests are ordered and referrals given as necessary.  Abnormal results of tests will be discussed with her when all of her results are completed.  Routine preventative health  maintenance measures emphasized: Exercise/Diet/Weight control, Tobacco Warnings, Alcohol/Substance use risks and Stress Management Mammogram ordered-patient does not allow pelvic exams so no Pap performed. 2.  Patient has a scheduled appointment with urology for dark malodorous urine possible cystoscopy.  Orders No orders of the defined types were placed in this encounter.   No orders of the defined types were placed in this encounter.         F/U  Return in about 1 year (around 08/29/2021) for Annual Physical.  Elonda Husky, M.D. 08/29/2020 10:08 AM

## 2021-01-11 IMAGING — US US ABDOMEN LIMITED
1 series · 13 of 25 positions shown · non-contrast
Comparison: February 20, 2015

CLINICAL DATA: Elevated liver enzymes.  Weight loss.

EXAM:
ULTRASOUND ABDOMEN LIMITED RIGHT UPPER QUADRANT

[Series 1: us abdomen limited · 13 of 51 slices shown]
[im 1/51]
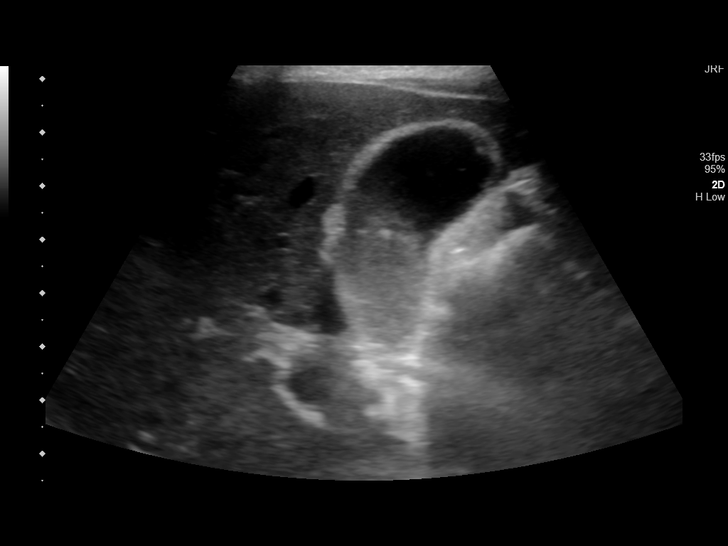
[im 5/51]
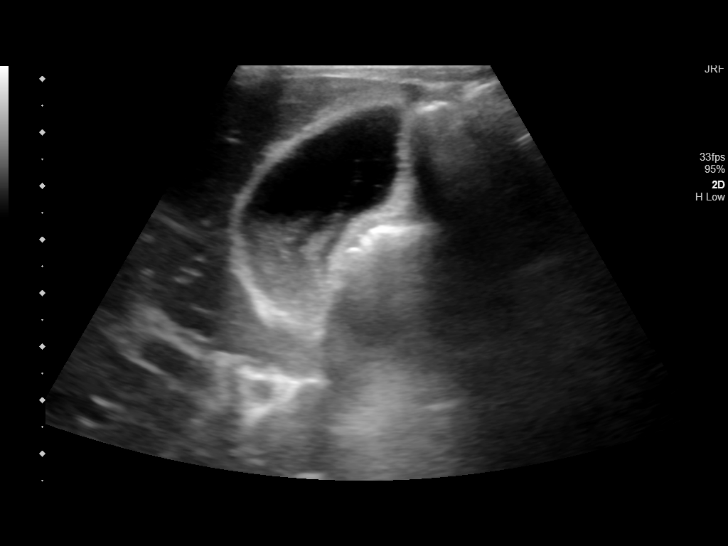
[im 9/51]
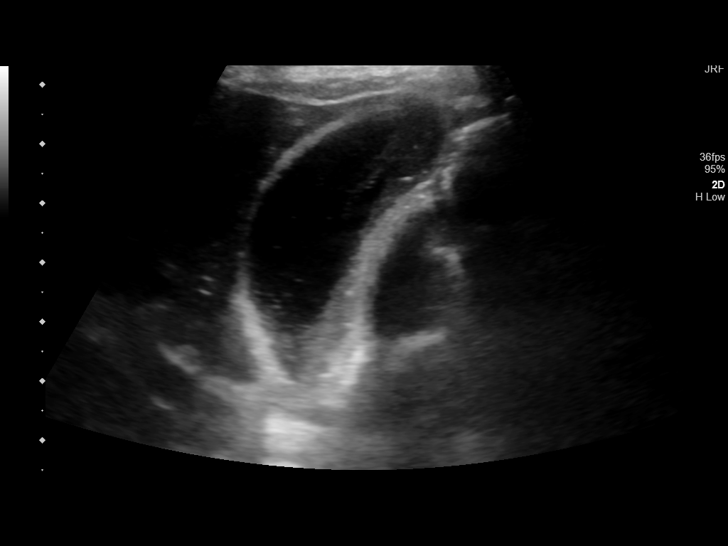
[im 13/51]
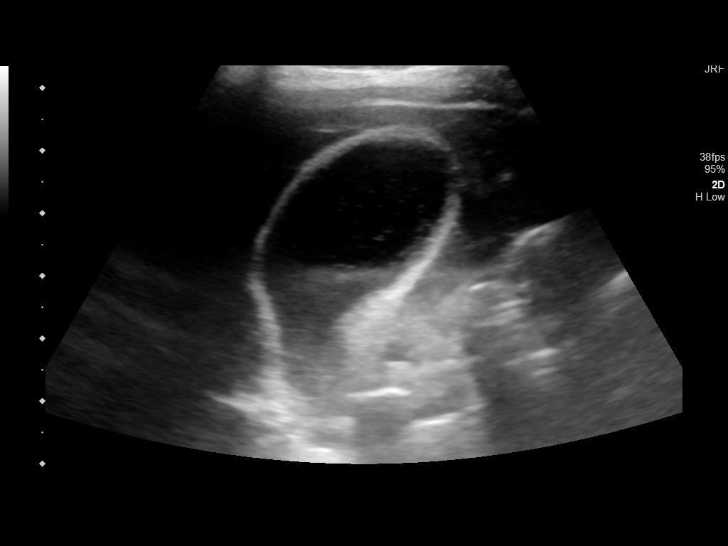
[im 17/51]
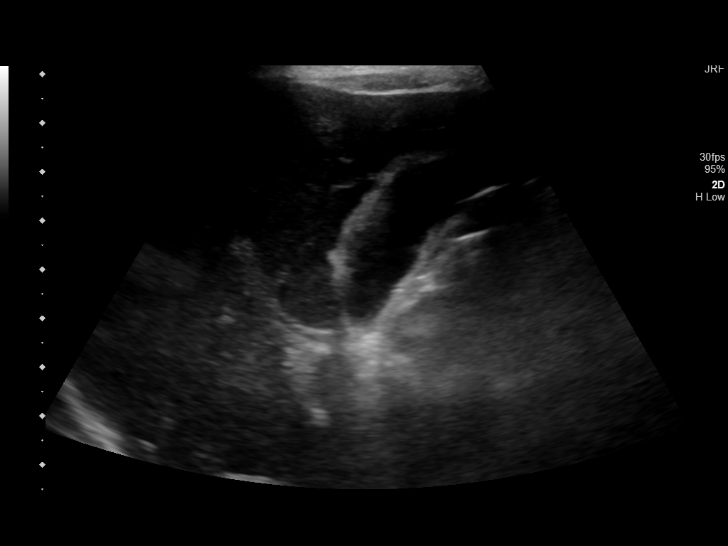
[im 21/51]
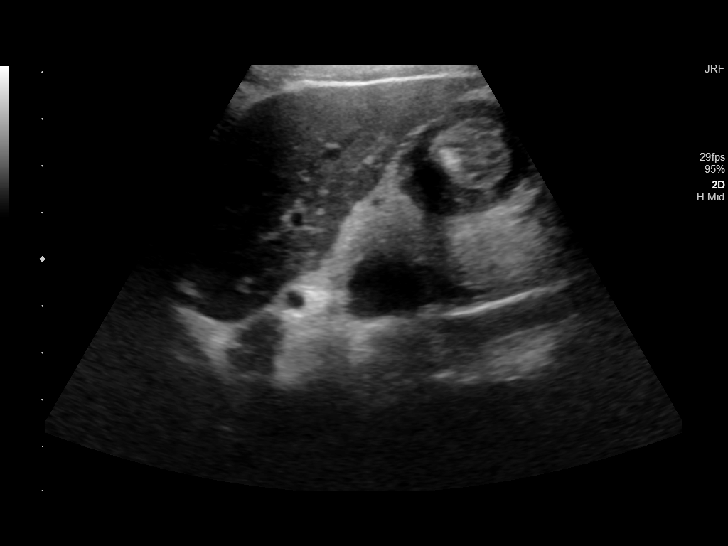
[im 26/51]
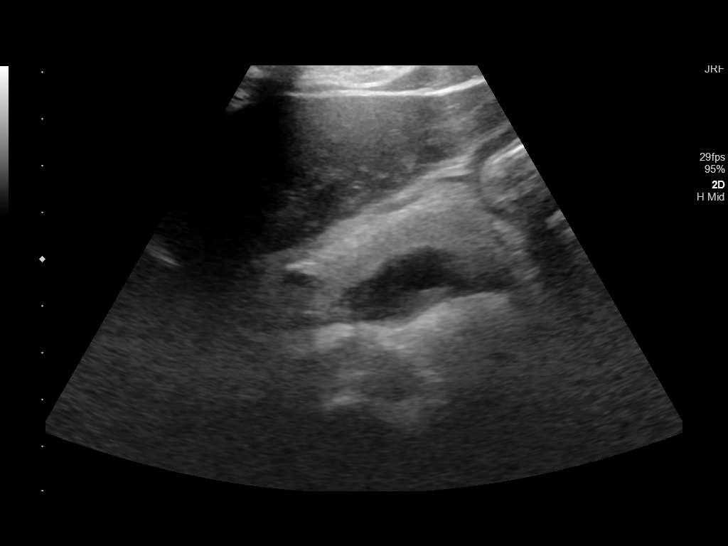
[im 30/51]
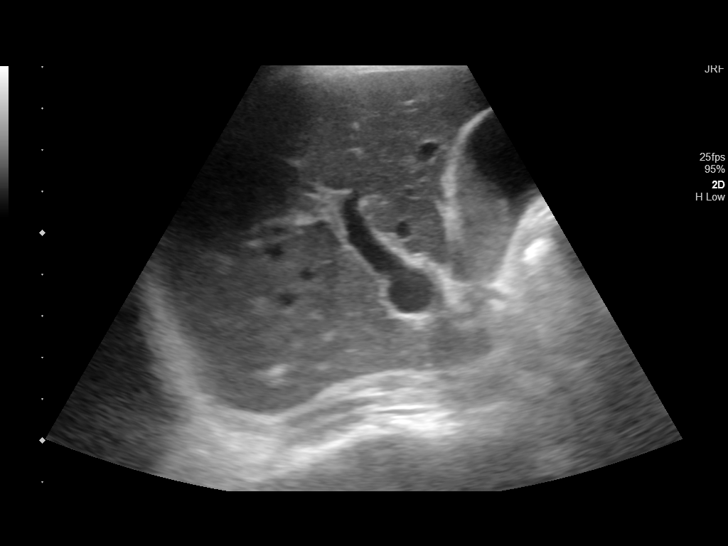
[im 34/51]
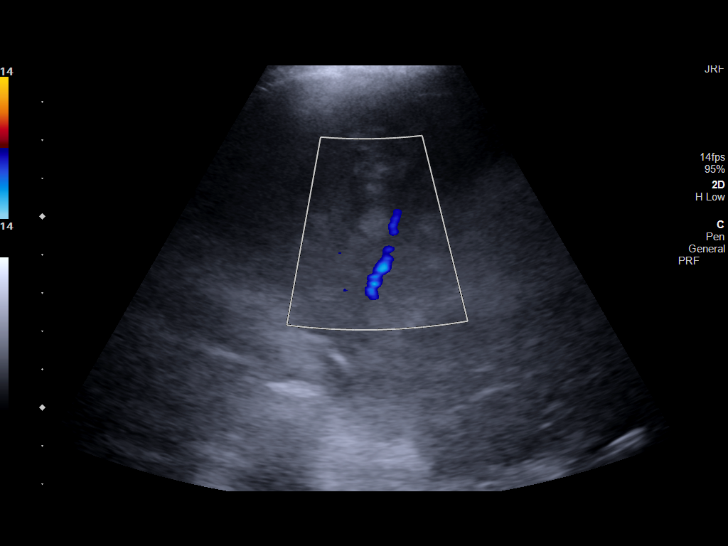
[im 38/51]
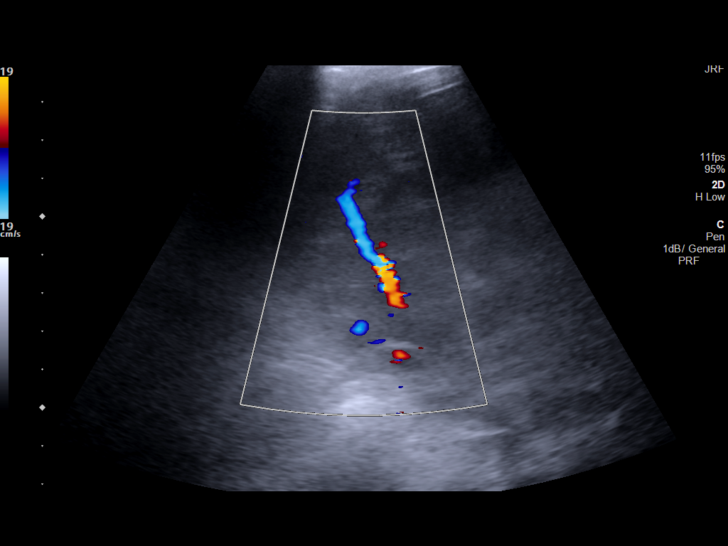
[im 42/51]
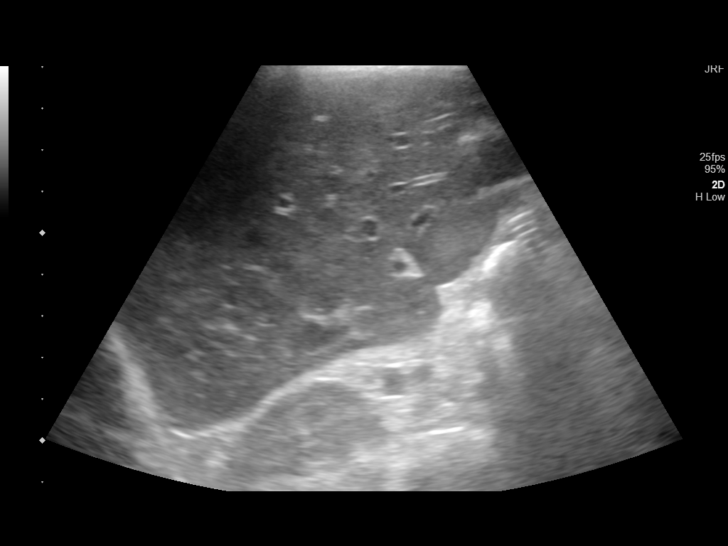
[im 46/51]
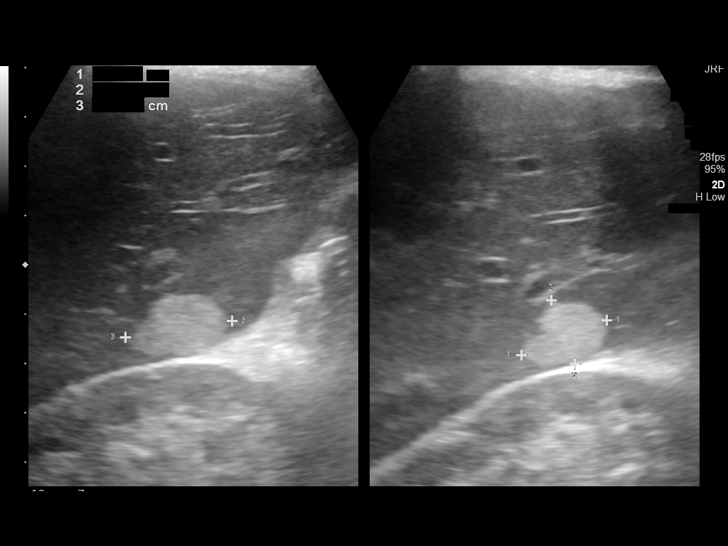
[im 51/51]
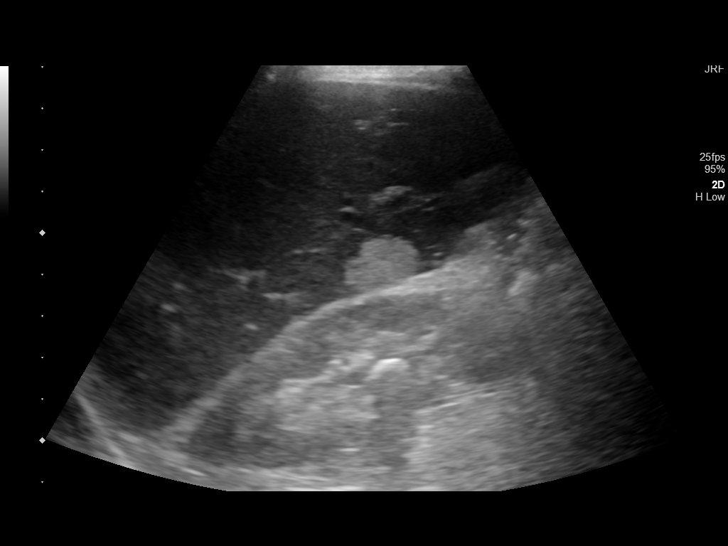

[13 of 25 positions shown; findings below may reference images not displayed]

FINDINGS: Gallbladder:

There is sludge in the gallbladder. While no focal gallstones are
appreciable, small gallstones could be intermingled with sludge of
this nature and obscured by the sludge. Gallbladder wall thickness
is borderline prominent. There is slight pericholecystic fluid. Due
to patient's mental status, assessment for focal gallbladder
tenderness is not felt to be reliable.

Common bile duct:

Diameter: 2 mm. No intrahepatic or extrahepatic biliary duct
dilatation.

Liver:

There is an echogenic focus in the right lobe of the liver measuring
0.8 x 0.7 x 0.9 cm. A second hyperechoic focus in the right near the
hepatorenal fossa measures 1.9 x 1.4 x 2.2 cm, similar to prior
study. Within normal limits in parenchymal echogenicity. Portal
vein is patent on color Doppler imaging with normal direction of
blood flow towards the liver.

Other: Right kidney echogenic.
IMPRESSION: 1. Sludge within gallbladder. No gallstones seen; small gallstones
could be intermingled with sludge in obscured. Gallbladder wall is
borderline thickened with equivocal pericholecystic fluid. These are
findings overall concerning for potential degree of cholecystitis.
In this regard, it may be prudent to consider nuclear medicine
hepatobiliary imaging study to assess for cystic duct patency.

2. Apparent liver hemangiomas. Overall liver echogenicity within
normal limits.

3. Echogenic right kidney, a finding likely indicative of medical
renal disease.

These results will be called to the ordering clinician or
representative by the Radiologist Assistant, and communication
documented in the PACS or zVision Dashboard.

## 2021-03-04 ENCOUNTER — Emergency Department: Payer: Medicare Other

## 2021-03-04 ENCOUNTER — Other Ambulatory Visit: Payer: Self-pay

## 2021-03-04 ENCOUNTER — Emergency Department
Admission: EM | Admit: 2021-03-04 | Discharge: 2021-03-04 | Disposition: A | Discharge status: Home / Self Care | Payer: Medicare Other | Attending: Emergency Medicine | Admitting: Emergency Medicine

## 2021-03-04 DIAGNOSIS — R112 Nausea with vomiting, unspecified: Secondary | ICD-10-CM | POA: Diagnosis present

## 2021-03-04 DIAGNOSIS — K449 Diaphragmatic hernia without obstruction or gangrene: Secondary | ICD-10-CM | POA: Diagnosis not present

## 2021-03-04 DIAGNOSIS — N390 Urinary tract infection, site not specified: Secondary | ICD-10-CM | POA: Diagnosis not present

## 2021-03-04 LAB — COMPREHENSIVE METABOLIC PANEL
ALT: 26 U/L (ref 0–44)
AST: 23 U/L (ref 15–41)
Albumin: 3.9 g/dL (ref 3.5–5.0)
Alkaline Phosphatase: 217 U/L — ABNORMAL HIGH (ref 38–126)
Anion gap: 10 (ref 5–15)
BUN: 24 mg/dL — ABNORMAL HIGH (ref 6–20)
CO2: 31 mmol/L (ref 22–32)
Calcium: 9.4 mg/dL (ref 8.9–10.3)
Chloride: 102 mmol/L (ref 98–111)
Creatinine, Ser: 0.7 mg/dL (ref 0.44–1.00)
GFR, Estimated: >60 mL/min (ref >60)
Glucose, Bld: 113 mg/dL — ABNORMAL HIGH (ref 70–99)
Potassium: 3.7 mmol/L (ref 3.5–5.1)
Sodium: 143 mmol/L (ref 135–145)
Total Bilirubin: 0.5 mg/dL (ref 0.3–1.2)
Total Protein: 7.9 g/dL (ref 6.5–8.1)

## 2021-03-04 LAB — URINALYSIS, COMPLETE (UACMP) WITH MICROSCOPIC
Bilirubin Urine: NEGATIVE
Glucose, UA: NEGATIVE mg/dL
Ketones, ur: NEGATIVE mg/dL
Nitrite: NEGATIVE
Protein, ur: 30 mg/dL — AB
RBC / HPF: >50 RBC/hpf — ABNORMAL HIGH (ref 0–5)
Specific Gravity, Urine: 1.017 (ref 1.005–1.030)
WBC, UA: >50 WBC/hpf — ABNORMAL HIGH (ref 0–5)
pH: 5 (ref 5.0–8.0)

## 2021-03-04 LAB — CBC WITH DIFFERENTIAL/PLATELET
Abs Immature Granulocytes: 0.03 10*3/uL (ref 0.00–0.07)
Basophils Absolute: 0 10*3/uL (ref 0.0–0.1)
Basophils Relative: 0 %
Eosinophils Absolute: 0 10*3/uL (ref 0.0–0.5)
Eosinophils Relative: 0 %
HCT: 41.6 % (ref 36.0–46.0)
Hemoglobin: 13 g/dL (ref 12.0–15.0)
Immature Granulocytes: 0 %
Lymphocytes Relative: 8 %
Lymphs Abs: 0.6 10*3/uL — ABNORMAL LOW (ref 0.7–4.0)
MCH: 27.8 pg (ref 26.0–34.0)
MCHC: 31.3 g/dL (ref 30.0–36.0)
MCV: 88.9 fL (ref 80.0–100.0)
Monocytes Absolute: 0.4 10*3/uL (ref 0.1–1.0)
Monocytes Relative: 5 %
Neutro Abs: 5.9 10*3/uL (ref 1.7–7.7)
Neutrophils Relative %: 87 %
Platelets: 157 10*3/uL (ref 150–400)
RBC: 4.68 MIL/uL (ref 3.87–5.11)
RDW: 14.3 % (ref 11.5–15.5)
WBC: 6.8 10*3/uL (ref 4.0–10.5)
nRBC: 0 % (ref 0.0–0.2)

## 2021-03-04 LAB — LIPASE, BLOOD: Lipase: 57 U/L — ABNORMAL HIGH (ref 11–51)

## 2021-03-04 LAB — TROPONIN I (HIGH SENSITIVITY): Troponin I (High Sensitivity): 4 ng/L (ref <18)

## 2021-03-04 MED ORDER — SODIUM CHLORIDE 0.9 % IV SOLN: 1.0000 g | Freq: Once | INTRAVENOUS | Status: DC

## 2021-03-04 MED ORDER — CEFDINIR 300 MG PO CAPS
300.0000 mg | ORAL_CAPSULE | Freq: Two times a day (BID) | ORAL | Status: DC
  Filled 2021-03-04: qty 1

## 2021-03-04 MED ORDER — CEFDINIR 300 MG PO CAPS: 300.0000 mg | ORAL_CAPSULE | Freq: Two times a day (BID) | ORAL | 0 refills | Status: AC

## 2021-03-04 MED ORDER — CEPHALEXIN 500 MG PO CAPS
500.0000 mg | ORAL_CAPSULE | Freq: Once | ORAL | Status: AC
  Administered 2021-03-04: 500 mg via ORAL
  Filled 2021-03-04: qty 1

## 2021-03-04 NOTE — Discharge Instructions (Addendum)
Reviewed theYour CAT scan shows that you have a kidney stone in the right kidney which you have had before.  It looks like you likely have a urinary tract infection.  He received the first dose of IV antibiotics in the emergency department.  Please take the antibiotic orally twice a day for the next 10 days.  If you develop fever, please return to the emergency department.

## 2021-03-04 NOTE — ED Triage Notes (Signed)
Pt comes ems from [facility]. N/v about 2 hours ago after she ate. Nonverbal/non ambulatory at baseline. 4mg  zofran IM. 156/83, 100% RA, 71 HR.

## 2021-03-04 NOTE — ED Triage Notes (Signed)
See first nurse note. Pt non verbal. NAD noted in triage. RR even and unlabored.

## 2021-03-04 NOTE — ED Notes (Signed)
Spoke with group home and gave report. Concern about not getting RX filled before closing of pharmacy tonight.  EDP aware.  Pt will be given 1 dose of anbx before leaving and can get prescription filled tomorrow.

## 2021-03-04 NOTE — ED Notes (Signed)
Patient transported to CT 

## 2021-03-04 NOTE — ED Notes (Signed)
Phlebotomy called to assist with blood draw

## 2021-03-04 NOTE — ED Provider Notes (Signed)
[facility] Provider Note    Event Date/Time   First MD Initiated Contact with Patient 03/04/21 1314     (approximate)   History   Emesis   HPI  [name] is a 56 y.o. female  with past medical history of cerebral palsy, seizure disorder, MR who presents with nausea  and vomiting.  Patient is nonverbal at baseline.  Per EMS she had several episodes of emesis at her group home.    Past Medical History:  Diagnosis Date   Acne    Cerebral palsy (HCC)    Mental retardation    Myopia    Scoliosis    Seizure disorder [facility])     Patient Active Problem List   Diagnosis Date Noted   Mental retardation 02/21/2015   Seizure disorder, primary (HCC) 02/21/2015   Menopause 02/21/2015   Abnormal liver function tests 02/21/2015     Physical Exam  Triage Vital Signs: ED Triage Vitals  Enc Vitals Group     BP 03/04/21 1314 (!) 153/68     Pulse Rate 03/04/21 1314 77     Resp 03/04/21 1314 18     Temp 03/04/21 1314 97.9 F (36.6 C)     Temp Source 03/04/21 1314 Axillary     SpO2 03/04/21 1314 100 %     Weight 03/04/21 1316 110 lb (49.9 kg)     Height 03/04/21 1316 4\' 10"  (1.473 m)     Head Circumference --      Peak Flow --      Pain Score --      Pain Loc --      Pain Edu? --      Excl. in GC? --     Most recent vital signs: Vitals:   03/04/21 1314  BP: (!) 153/68  Pulse: 77  Resp: 18  Temp: 97.9 F (36.6 C)  SpO2: 100%     General: Awake, no distress.  CV:  Good peripheral perfusion.  Resp:  Normal effort.  Abd:  No distention.  Patient tensing abdominal muscles, does not seem to react to palpation all 4 quadrants Neuro:             Patient is awake, eyes open, nonverbal, contracted Other:     ED Results / Procedures / Treatments  Labs (all labs ordered are listed, but only abnormal results are displayed) Labs Reviewed  COMPREHENSIVE METABOLIC PANEL - Abnormal; Notable for the following components:      Result Value    Glucose, Bld 113 (*)    BUN 24 (*)    Alkaline Phosphatase 217 (*)    All other components within normal limits  CBC WITH DIFFERENTIAL/PLATELET - Abnormal; Notable for the following components:   Lymphs Abs 0.6 (*)    All other components within normal limits  LIPASE, BLOOD - Abnormal; Notable for the following components:   Lipase 57 (*)    All other components within normal limits  URINALYSIS, COMPLETE (UACMP) WITH MICROSCOPIC - Abnormal; Notable for the following components:   Color, Urine YELLOW (*)    APPearance CLOUDY (*)    Hgb urine dipstick MODERATE (*)    Protein, ur 30 (*)    Leukocytes,Ua LARGE (*)    RBC / HPF >50 (*)    WBC, UA >50 (*)    Bacteria, UA MANY (*)    Non Squamous Epithelial PRESENT (*)    All other components within normal limits  URINE CULTURE  TROPONIN I (HIGH SENSITIVITY)     EKG     RADIOLOGY I reviewed the CT abdomen pelvis which shows constipation and a staghorn calculus   PROCEDURES:  Critical Care performed: No  .1-3 Lead EKG Interpretation Performed by: Rada Hay, MD Authorized by: Rada Hay, MD     Interpretation: normal     ECG rate assessment: normal     Rhythm: sinus rhythm     Ectopy: none     Conduction: normal    The patient is on the cardiac monitor to evaluate for evidence of arrhythmia and/or significant heart rate changes.   MEDICATIONS ORDERED IN ED: Medications  cefdinir (OMNICEF) capsule 300 mg (has no administration in time range)     IMPRESSION / MDM / ASSESSMENT AND PLAN / ED COURSE  I reviewed the triage vital signs and the nursing notes.                              Differential diagnosis includes, but is not limited to, gastroenteritis, biliary colic, pancreatitis, SBO, ACS  Patient is a 56 year old female with history of intellectual disability who is nonverbal at baseline who presents with several episodes of nausea and vomiting.  She received Zofran with EMS.  Unable to obtain  history from the patient given she is nonverbal.  She is awake and has intermittent grunting but no purposeful response.  Does not follow commands.  Abdominal exam somewhat difficult, seems to be tensing her abdominal muscles but does not clearly react to palpation.  Given limited history will obtain labs including a troponin, EKG and CT abdomen pelvis.  Patient is not having active emesis at this time.  CT abdomen pelvis does not have any acute pathology although is limited due to streak artifact from her spinal hardware.  No evidence of SBO.  There is a large staghorn calculus of the right kidney which has been commented on in prior CTs as well.  Patient's UA is dirty, on review of most recent urine culture data she was sensitive to cephalosporins.  Given she is not meeting sepsis criteria has not had any ongoing vomiting will discharge on oral antibiotics.  7 days of cefdinir for complicated UTI.      FINAL CLINICAL IMPRESSION(S) / ED DIAGNOSES   Final diagnoses:  Urinary tract infection without hematuria, site unspecified     Rx / DC Orders   ED Discharge Orders          Ordered    cefdinir (OMNICEF) 300 MG capsule  2 times daily        03/04/21 1537             Note:  This document was prepared using Dragon voice recognition software and may include unintentional dictation errors.   Rada Hay, MD 03/04/21 [phone]

## 2021-03-06 LAB — URINE CULTURE: Culture: >=100000 — AB

## 2021-08-20 ENCOUNTER — Telehealth: Payer: Self-pay | Admitting: Obstetrics and Gynecology

## 2021-08-20 NOTE — Telephone Encounter (Signed)
Called to inform pt of apt change- unable to leave message- sent mychart message.   This message is in reference to your upcoming appointment on 08/31/2021.  Unfortunately, your provider will be out of the office and we need to reschedule your appointment to a later date. Our practice is combining with [facility] in Fall 2023, and together we will become [facility] [place]. We should have new schedules in place by September, and will contact you at that time for scheduling. We apologize for any inconvenience. If you have any questions, or need other assistance prior to that time, please contact our office at [phone]. Thank you.

## 2021-08-31 ENCOUNTER — Encounter: Payer: Medicare Other | Admitting: Obstetrics and Gynecology

## 2021-10-24 ENCOUNTER — Encounter: Payer: Self-pay | Admitting: Obstetrics and Gynecology

## 2021-10-24 DIAGNOSIS — Z0000 Encounter for general adult medical examination without abnormal findings: Secondary | ICD-10-CM

## 2021-10-24 DIAGNOSIS — Z1231 Encounter for screening mammogram for malignant neoplasm of breast: Secondary | ICD-10-CM

## 2021-11-16 ENCOUNTER — Ambulatory Visit (INDEPENDENT_AMBULATORY_CARE_PROVIDER_SITE_OTHER): Payer: Medicare Other | Admitting: Obstetrics and Gynecology

## 2021-11-16 ENCOUNTER — Encounter: Payer: Self-pay | Admitting: Obstetrics and Gynecology

## 2021-11-16 VITALS — BP 153/89 | HR 77 | Ht <= 58 in | Wt 100.0 lb

## 2021-11-16 DIAGNOSIS — B372 Candidiasis of skin and nail: Secondary | ICD-10-CM

## 2021-11-16 DIAGNOSIS — Z23 Encounter for immunization: Secondary | ICD-10-CM

## 2021-11-16 DIAGNOSIS — Z01419 Encounter for gynecological examination (general) (routine) without abnormal findings: Secondary | ICD-10-CM

## 2021-11-16 DIAGNOSIS — Z124 Encounter for screening for malignant neoplasm of cervix: Secondary | ICD-10-CM

## 2021-11-16 DIAGNOSIS — Z1231 Encounter for screening mammogram for malignant neoplasm of breast: Secondary | ICD-10-CM

## 2021-11-16 MED ORDER — FLUCONAZOLE 150 MG PO TABS: 150.0000 mg | ORAL_TABLET | ORAL | 1 refills | Status: AC

## 2021-11-16 NOTE — Addendum Note (Signed)
Addended by: Douglass Rivers R on: 11/16/2021 11:55 AM   Modules accepted: Orders

## 2021-11-16 NOTE — Progress Notes (Signed)
HPI:      [name] is a 56 y.o. G0P0000 who LMP was No LMP recorded. Patient is postmenopausal.  Subjective:   She presents today for her annual examination.  She is mentally challenged and comes from a group home.  They report no vaginal bleeding or other issues.  She had a kidney stone removed in the last year which has significantly improved her urination. She is unable to comply with Pap smears.  She does have an updated mammogram.    Hx: The following portions of the patient's history were reviewed and updated as appropriate:             She  has a past medical history of Acne, Cerebral palsy ([place]), Kidney stones, Mental retardation, Myopia, Scoliosis, and Seizure disorder ([place]). She does not have any pertinent problems on file. She  has a past surgical history that includes Partial hip arthroplasty and Revision total knee arthroplasty. Her family history is not on file. She  reports that she has never smoked. She has never used smokeless tobacco. She reports that she does not drink alcohol and does not use drugs. She has a current medication list which includes the following prescription(s): aspirin ec, azelastine, baclofen, carbamazepine, cetirizine, cholecalciferol, dicyclomine, fluconazole, furosemide, hydroxyzine, iron polysaccharides, levothyroxine, lorazepam, metoclopramide, mirtazapine, multivitamin, nitrofurantoin (macrocrystal-monohydrate), olanzapine, omeprazole, poly fe cmplx-fehempoly-fa-b12, polysacch fe cmp-fe heme poly, potassium chloride, eucerin, sucralfate, sulfacetamide sodium (acne), and UNABLE TO FIND. She is allergic to amantadines.       Review of Systems:  Review of Systems  Constitutional: Denied constitutional symptoms, night sweats, recent illness, fatigue, fever, insomnia and weight loss.  Eyes: Denied eye symptoms, eye pain, photophobia, vision change and visual disturbance.  Ears/Nose/Throat/Neck: Denied ear, nose, throat or neck symptoms,  hearing loss, nasal discharge, sinus congestion and sore throat.  Cardiovascular: Denied cardiovascular symptoms, arrhythmia, chest pain/pressure, edema, exercise intolerance, orthopnea and palpitations.  Respiratory: Denied pulmonary symptoms, asthma, pleuritic pain, productive sputum, cough, dyspnea and wheezing.  Gastrointestinal: Denied, gastro-esophageal reflux, melena, nausea and vomiting.  Genitourinary: Denied genitourinary symptoms including symptomatic vaginal discharge, pelvic relaxation issues, and urinary complaints.  Musculoskeletal: Denied musculoskeletal symptoms, stiffness, swelling, muscle weakness and myalgia.  Dermatologic: Denied dermatology symptoms, rash and scar.  Neurologic: Denied neurology symptoms, dizziness, headache, neck pain and syncope.  Psychiatric: Denied psychiatric symptoms, anxiety and depression.  Endocrine: Denied endocrine symptoms including hot flashes and night sweats.   Meds:   Current Outpatient Medications on File Prior to Visit  Medication Sig Dispense Refill   aspirin 325 MG EC tablet Take 325 mg by mouth daily.     azelastine (OPTIVAR) 0.05 % ophthalmic solution Place 1 drop into both eyes 2 (two) times daily.     baclofen (LIORESAL) 10 MG tablet Take 20 mg by mouth 3 (three) times daily.      carbamazepine (TEGRETOL XR) 200 MG 12 hr tablet Take 100 mg by mouth 2 (two) times daily.      cetirizine (ZYRTEC) 10 MG chewable tablet Chew 10 mg by mouth as needed.      cholecalciferol (VITAMIN D) 1000 units tablet Take 1,250 Units by mouth every other day.      dicyclomine (BENTYL) 10 MG capsule Take 10 mg by mouth 4 (four) times daily -  before meals and at bedtime.     furosemide (LASIX) 20 MG tablet Take 20 mg by mouth as needed.      hydrOXYzine (ATARAX/VISTARIL) 25 MG tablet Take by mouth.  iron polysaccharides (NIFEREX) 150 MG capsule Take 1 capsule by mouth daily.     levothyroxine (SYNTHROID, LEVOTHROID) 100 MCG tablet Take 100 mcg by  mouth daily before breakfast.     LORazepam (ATIVAN) 1 MG tablet Take 1 mg by mouth as needed.      metoCLOPramide (REGLAN) 5 MG tablet Take 5 mg by mouth 4 (four) times daily.     mirtazapine (REMERON) 7.5 MG tablet Take 7.5 mg by mouth at bedtime.     Multiple Vitamin (MULTIVITAMIN) capsule Take 1 capsule by mouth daily.     nitrofurantoin, macrocrystal-monohydrate, (MACROBID) 100 MG capsule Take 1 capsule (100 mg total) by mouth 2 (two) times daily. 14 capsule 0   OLANZapine (ZYPREXA) 5 MG tablet Take 5 mg by mouth as needed.      omeprazole (PRILOSEC) 20 MG capsule Take 20 mg by mouth 2 (two) times daily.      Poly Fe Cmplx-FeHemPoly-FA-B12 22-6-1-0.025 MG TABS Take 1 tablet by mouth.     POLYSACCH FE CMP-FE HEME POLY PO Take 150 mg by mouth daily.     potassium chloride (K-DUR,KLOR-CON) 10 MEQ tablet Take 10 mEq by mouth 2 (two) times daily.     Skin Protectants, Misc. (EUCERIN) cream Apply topically as needed for dry skin.     sucralfate (CARAFATE) 1 GM/10ML suspension Take 1 g by mouth 4 (four) times daily -  with meals and at bedtime.     Sulfacetamide Sodium, Acne, 10 % LOTN Apply topically 2 (two) times daily.      UNABLE TO FIND Med Name: CBD Oil 500mg /85mL.  1 dropper under tongue BID.     No current facility-administered medications on file prior to visit.     Objective:     Vitals:   11/16/21 1119  BP: (!) 153/89  Pulse: 77    Filed Weights   11/16/21 1119  Weight: 100 lb (45.4 kg)              Examination of the external genitalia reveals normal-appearing vulva vaginal opening and urethra.  No evidence of irritation. However examination of the creases of her thighs reveal erythematous rash likely consistent with monilia.  Assessment:    G0P0000 Patient Active Problem List   Diagnosis Date Noted   Mental retardation 02/21/2015   Seizure disorder, primary (HCC) 02/21/2015   Menopause 02/21/2015   Abnormal liver function tests 02/21/2015     1. Well woman  exam with routine gynecological exam   2. Monilial rash        Plan:            1.  Plan follow-up in 1 year.  2.  Diflucan for suspected monilial rash.  3.  New care of the vulva Orders No orders of the defined types were placed in this encounter.    Meds ordered this encounter  Medications   fluconazole (DIFLUCAN) 150 MG tablet    Sig: Take 1 tablet (150 mg total) by mouth 2 (two) times a week for 3 doses. Can take additional dose three days later if symptoms persist    Dispense:  3 tablet    Refill:  1          F/U  Return in about 1 year (around 11/17/2022) for Annual Physical.  11/19/2022, M.D. 11/16/2021 11:42 AM

## 2021-11-16 NOTE — Progress Notes (Signed)
Patients presents for annual exam today. Caregiver states no gyn concerns at this time, denies PMB bleeding. Patient is due for pap smear, declined. Up to date mammogram, 10/01/21. Flu shot administered. Patient states no other questions or concerns at this time.

## 2022-01-17 ENCOUNTER — Ambulatory Visit
Admission: EM | Admit: 2022-01-17 | Discharge: 2022-01-17 | Disposition: A | Discharge status: Home / Self Care | Payer: Medicare Other | Attending: Emergency Medicine | Admitting: Emergency Medicine

## 2022-01-17 DIAGNOSIS — R197 Diarrhea, unspecified: Secondary | ICD-10-CM | POA: Diagnosis not present

## 2022-01-17 DIAGNOSIS — A084 Viral intestinal infection, unspecified: Secondary | ICD-10-CM | POA: Insufficient documentation

## 2022-01-17 DIAGNOSIS — Z1152 Encounter for screening for COVID-19: Secondary | ICD-10-CM | POA: Insufficient documentation

## 2022-01-17 LAB — SARS CORONAVIRUS 2 BY RT PCR: SARS Coronavirus 2 by RT PCR: NEGATIVE

## 2022-01-17 NOTE — Discharge Instructions (Signed)
Your symptoms are most likely caused by a virus, it will work its way out your system over the next few days  COVID test is negative, flu testing was not obtained today due to lack of fevers which is a hallmark symptom  You can use Imodium  to help with diarrhea, and be mindful over use of this medication may cause opposite effect constipation  You can use over-the-counter ibuprofen or Tylenol, which ever you have at home, to help manage fevers  Continue to promote hydration throughout the day by using electrolyte replacement solution such as Gatorade, body armor, Pedialyte, which ever you have at home  Try eating bland foods such as bread, rice, toast, fruit which are easier on the stomach to digest, avoid foods that are overly spicy, overly seasoned or greasy

## 2022-01-17 NOTE — ED Triage Notes (Signed)
Pt c/o Diarrhea x2days  Pt denies any cough, congestion.   Pt caretaker states that she is not normally with this patient and is assisting. She is unsure of height, weight, allergies, or medications.

## 2022-01-17 NOTE — ED Provider Notes (Signed)
[facility]    CSN: [id number] Arrival date & time: 01/17/22  1021      History   Chief Complaint Chief Complaint  Patient presents with   Diarrhea    HPI [name] is a 56 y.o. female.   Patient presents for evaluation of diarrhea beginning this morning.  Known sick contacts at group home who have similar symptoms.  Tolerating food and liquids.  Caregivers are planning to give Imodium, medication will be delivered today.  Denies fevers, vomiting, upper respiratory symptoms.  History of cerebral palsy and mental retardation, all history obtained from caregiver.    Past Medical History:  Diagnosis Date   Acne    Cerebral palsy (HCC)    Kidney stones    Mental retardation    Myopia    Scoliosis    Seizure disorder [facility])     Patient Active Problem List   Diagnosis Date Noted   Mental retardation 02/21/2015   Seizure disorder, primary (HCC) 02/21/2015   Menopause 02/21/2015   Abnormal liver function tests 02/21/2015    Past Surgical History:  Procedure Laterality Date   PARTIAL HIP ARTHROPLASTY     REVISION TOTAL KNEE ARTHROPLASTY      OB History     Gravida  0   Para  0   Term  0   Preterm  0   AB  0   Living  0      SAB  0   IAB  0   Ectopic  0   Multiple  0   Live Births               Home Medications    Prior to Admission medications   Medication Sig Start Date End Date Taking? Authorizing Provider  aspirin 325 MG EC tablet Take 325 mg by mouth daily.    [provider]  azelastine (OPTIVAR) 0.05 % ophthalmic solution Place 1 drop into both eyes 2 (two) times daily.    [provider]  baclofen (LIORESAL) 10 MG tablet Take 20 mg by mouth 3 (three) times daily.     [provider]  carbamazepine (TEGRETOL XR) 200 MG 12 hr tablet Take 100 mg by mouth 2 (two) times daily.     [provider]  cetirizine (ZYRTEC) 10 MG chewable tablet Chew 10 mg by mouth as needed.     [provider]  cholecalciferol (VITAMIN D) 1000 units tablet Take 1,250 Units by mouth every other day.     [provider]  dicyclomine (BENTYL) 10 MG capsule Take 10 mg by mouth 4 (four) times daily -  before meals and at bedtime.    [provider]  furosemide (LASIX) 20 MG tablet Take 20 mg by mouth as needed.     [provider]  hydrOXYzine (ATARAX/VISTARIL) 25 MG tablet Take by mouth. 09/06/19   [provider]  iron polysaccharides (NIFEREX) 150 MG capsule Take 1 capsule by mouth daily. 09/01/19   [provider]  levothyroxine (SYNTHROID, LEVOTHROID) 100 MCG tablet Take 100 mcg by mouth daily before breakfast.    [provider]  LORazepam (ATIVAN) 1 MG tablet Take 1 mg by mouth as needed.     [provider]  metoCLOPramide (REGLAN) 5 MG tablet Take 5 mg by mouth 4 (four) times daily.    [provider]  mirtazapine (REMERON) 7.5 MG tablet Take 7.5 mg by mouth at bedtime.    [provider]  Multiple Vitamin (MULTIVITAMIN) capsule Take 1 capsule by mouth daily.    [provider]  nitrofurantoin, macrocrystal-monohydrate, (MACROBID) 100 MG capsule Take 1 capsule (100 mg total) by mouth 2 (two) times daily. 02/25/20   Linzie Collin, MD  OLANZapine (ZYPREXA) 5 MG tablet Take 5 mg by mouth as needed.     [provider]  omeprazole (PRILOSEC) 20 MG capsule Take 20 mg by mouth 2 (two) times daily.     [provider]  Poly Fe Cmplx-FeHemPoly-FA-B12 22-6-1-0.025 MG TABS Take 1 tablet by mouth.    [provider]  POLYSACCH FE CMP-FE HEME POLY PO Take 150 mg by mouth daily.    [provider]  potassium chloride (K-DUR,KLOR-CON) 10 MEQ tablet Take 10 mEq by mouth 2 (two) times daily.    [provider]  Skin Protectants, Misc. (EUCERIN) cream Apply topically as needed for dry skin.    [provider]  sucralfate (CARAFATE) 1 GM/10ML suspension Take 1  g by mouth 4 (four) times daily -  with meals and at bedtime.    [provider]  Sulfacetamide Sodium, Acne, 10 % LOTN Apply topically 2 (two) times daily.     [provider]  UNABLE TO FIND Med Name: CBD Oil 500mg /45mL.  1 dropper under tongue BID.    [provider]    Family History Family History  Problem Relation Age of Onset   Breast cancer Neg Hx    Ovarian cancer Neg Hx    Colon cancer Neg Hx     Social History Social History   Tobacco Use   Smoking status: Never   Smokeless tobacco: Never  Vaping Use   Vaping Use: Never used  Substance Use Topics   Alcohol use: No   Drug use: No     Allergies   Amantadines   Review of Systems Review of Systems  Constitutional: Negative.   HENT: Negative.    Respiratory: Negative.    Cardiovascular: Negative.   Gastrointestinal:  Positive for diarrhea. Negative for abdominal distention, abdominal pain, anal bleeding, blood in stool, constipation, nausea, rectal pain and vomiting.     Physical Exam Triage Vital Signs ED Triage Vitals [01/17/22 1211]  Enc Vitals Group     BP 126/88     Pulse      Resp      Temp      Temp src      SpO2      Weight      Height      Head Circumference      Peak Flow      Pain Score      Pain Loc      Pain Edu?      Excl. in GC?    No data found.  Updated Vital Signs BP 126/88 (BP Location: Left Arm)   Visual Acuity Right Eye Distance:   Left Eye Distance:   Bilateral Distance:    Right Eye Near:   Left Eye Near:    Bilateral Near:     Physical Exam Constitutional:      Appearance: Normal appearance.  HENT:     Head: Normocephalic.  Eyes:     Extraocular Movements: Extraocular movements intact.  Pulmonary:     Effort: Pulmonary effort is normal.  Abdominal:     General: Abdomen is flat. Bowel sounds are normal. There is no distension.     Palpations: Abdomen is soft.  Tenderness: There is no abdominal tenderness. There is no right  CVA tenderness, left CVA tenderness or guarding.  Skin:    General: Skin is warm and dry.  Neurological:     Mental Status: She is alert and oriented to person, place, and time. Mental status is at baseline.      UC Treatments / Results  Labs (all labs ordered are listed, but only abnormal results are displayed) Labs Reviewed  SARS CORONAVIRUS 2 BY RT PCR    EKG   Radiology No results found.  Procedures Procedures (including critical care time)  Medications Ordered in UC Medications - No data to display  Initial Impression / Assessment and Plan / UC Course  I have reviewed the triage vital signs and the nursing notes.  Pertinent labs & imaging results that were available during my care of the patient were reviewed by me and considered in my medical decision making (see chart for details).  Viral gastroenteritis  COVID test negative, vitals are stable, patient is in no signs of distress nor toxic appearing, no tenderness is present to the abdominal exam, low suspicion for an acute abdomen, as multiple patients at the group home have similar symptoms is most likely viral etiology, advised to continue with plan of use of Imodium as needed, advised to increase fluid intake for additional support to maintain hydration, may follow-up if symptoms worsen  Final Clinical Impressions(s) / UC Diagnoses   Final diagnoses:  None   Discharge Instructions   None    ED Prescriptions   None    PDMP not reviewed this encounter.   Valinda Hoar, NP 01/17/22 1342

## 2022-05-07 ENCOUNTER — Ambulatory Visit
Admission: EM | Admit: 2022-05-07 | Discharge: 2022-05-07 | Disposition: A | Discharge status: Home / Self Care | Payer: Medicare Other

## 2022-05-07 ENCOUNTER — Other Ambulatory Visit: Payer: Self-pay

## 2022-05-07 NOTE — ED Triage Notes (Signed)
Pt was brought to ER for fall. Caregiver doesn't know what happened was told she "fell" and she doesn't have the detalils but needed to bring her to the urgent care.

## 2022-05-24 ENCOUNTER — Other Ambulatory Visit: Payer: Self-pay

## 2022-05-24 ENCOUNTER — Inpatient Hospital Stay
Admission: EM | Admit: 2022-05-24 | Discharge: 2022-05-28 | DRG: 177 | Disposition: A | Discharge status: Home / Self Care | Payer: Medicare Other | Attending: Internal Medicine | Admitting: Internal Medicine

## 2022-05-24 ENCOUNTER — Encounter: Payer: Self-pay | Admitting: Emergency Medicine

## 2022-05-24 ENCOUNTER — Emergency Department: Payer: Medicare Other

## 2022-05-24 DIAGNOSIS — J90 Pleural effusion, not elsewhere classified: Secondary | ICD-10-CM | POA: Diagnosis present

## 2022-05-24 DIAGNOSIS — N3000 Acute cystitis without hematuria: Secondary | ICD-10-CM

## 2022-05-24 DIAGNOSIS — J1282 Pneumonia due to coronavirus disease 2019: Secondary | ICD-10-CM | POA: Diagnosis present

## 2022-05-24 DIAGNOSIS — G809 Cerebral palsy, unspecified: Secondary | ICD-10-CM | POA: Diagnosis present

## 2022-05-24 DIAGNOSIS — R112 Nausea with vomiting, unspecified: Secondary | ICD-10-CM | POA: Diagnosis present

## 2022-05-24 DIAGNOSIS — K769 Liver disease, unspecified: Secondary | ICD-10-CM | POA: Diagnosis present

## 2022-05-24 DIAGNOSIS — F79 Unspecified intellectual disabilities: Secondary | ICD-10-CM | POA: Diagnosis present

## 2022-05-24 DIAGNOSIS — Z79890 Hormone replacement therapy (postmenopausal): Secondary | ICD-10-CM

## 2022-05-24 DIAGNOSIS — B961 Klebsiella pneumoniae [K. pneumoniae] as the cause of diseases classified elsewhere: Secondary | ICD-10-CM | POA: Diagnosis present

## 2022-05-24 DIAGNOSIS — G40909 Epilepsy, unspecified, not intractable, without status epilepticus: Secondary | ICD-10-CM

## 2022-05-24 DIAGNOSIS — D696 Thrombocytopenia, unspecified: Secondary | ICD-10-CM | POA: Diagnosis present

## 2022-05-24 DIAGNOSIS — Z7982 Long term (current) use of aspirin: Secondary | ICD-10-CM

## 2022-05-24 DIAGNOSIS — U071 COVID-19: Secondary | ICD-10-CM | POA: Diagnosis present

## 2022-05-24 DIAGNOSIS — I2510 Atherosclerotic heart disease of native coronary artery without angina pectoris: Secondary | ICD-10-CM | POA: Diagnosis present

## 2022-05-24 DIAGNOSIS — Z888 Allergy status to other drugs, medicaments and biological substances status: Secondary | ICD-10-CM

## 2022-05-24 DIAGNOSIS — Z66 Do not resuscitate: Secondary | ICD-10-CM | POA: Diagnosis present

## 2022-05-24 DIAGNOSIS — Z79899 Other long term (current) drug therapy: Secondary | ICD-10-CM | POA: Diagnosis not present

## 2022-05-24 DIAGNOSIS — E039 Hypothyroidism, unspecified: Secondary | ICD-10-CM | POA: Diagnosis present

## 2022-05-24 DIAGNOSIS — Z87442 Personal history of urinary calculi: Secondary | ICD-10-CM | POA: Diagnosis not present

## 2022-05-24 DIAGNOSIS — N390 Urinary tract infection, site not specified: Secondary | ICD-10-CM | POA: Diagnosis present

## 2022-05-24 DIAGNOSIS — I700 Atherosclerosis of aorta: Secondary | ICD-10-CM | POA: Diagnosis present

## 2022-05-24 LAB — COMPREHENSIVE METABOLIC PANEL
ALT: 38 U/L (ref 0–44)
AST: 40 U/L (ref 15–41)
Albumin: 3.5 g/dL (ref 3.5–5.0)
Alkaline Phosphatase: 212 U/L — ABNORMAL HIGH (ref 38–126)
Anion gap: 9 (ref 5–15)
BUN: 23 mg/dL — ABNORMAL HIGH (ref 6–20)
CO2: 28 mmol/L (ref 22–32)
Calcium: 8.8 mg/dL — ABNORMAL LOW (ref 8.9–10.3)
Chloride: 108 mmol/L (ref 98–111)
Creatinine, Ser: 0.71 mg/dL (ref 0.44–1.00)
GFR, Estimated: >60 mL/min (ref >60)
Glucose, Bld: 108 mg/dL — ABNORMAL HIGH (ref 70–99)
Potassium: 4.2 mmol/L (ref 3.5–5.1)
Sodium: 145 mmol/L (ref 135–145)
Total Bilirubin: 0.6 mg/dL (ref 0.3–1.2)
Total Protein: 7.2 g/dL (ref 6.5–8.1)

## 2022-05-24 LAB — URINALYSIS, ROUTINE W REFLEX MICROSCOPIC
Bilirubin Urine: NEGATIVE
Glucose, UA: NEGATIVE mg/dL
Ketones, ur: NEGATIVE mg/dL
Nitrite: POSITIVE — AB
Protein, ur: 30 mg/dL — AB
Specific Gravity, Urine: 1.024 (ref 1.005–1.030)
pH: 5 (ref 5.0–8.0)

## 2022-05-24 LAB — CBC
HCT: 42.7 % (ref 36.0–46.0)
Hemoglobin: 13.1 g/dL (ref 12.0–15.0)
MCH: 28.8 pg (ref 26.0–34.0)
MCHC: 30.7 g/dL (ref 30.0–36.0)
MCV: 93.8 fL (ref 80.0–100.0)
Platelets: 108 10*3/uL — ABNORMAL LOW (ref 150–400)
RBC: 4.55 MIL/uL (ref 3.87–5.11)
RDW: 14.6 % (ref 11.5–15.5)
WBC: 4.3 10*3/uL (ref 4.0–10.5)
nRBC: 0 % (ref 0.0–0.2)

## 2022-05-24 LAB — TECHNOLOGIST SMEAR REVIEW: Plt Morphology: NORMAL

## 2022-05-24 LAB — SARS CORONAVIRUS 2 BY RT PCR: SARS Coronavirus 2 by RT PCR: POSITIVE — AB

## 2022-05-24 LAB — LACTATE DEHYDROGENASE: LDH: 178 U/L (ref 98–192)

## 2022-05-24 LAB — LIPASE, BLOOD: Lipase: 27 U/L (ref 11–51)

## 2022-05-24 LAB — LACTIC ACID, PLASMA: Lactic Acid, Venous: 1.3 mmol/L (ref 0.5–1.9)

## 2022-05-24 LAB — TROPONIN I (HIGH SENSITIVITY): Troponin I (High Sensitivity): 7 ng/L (ref <18)

## 2022-05-24 MED ORDER — SODIUM CHLORIDE 0.9 % IV SOLN: INTRAVENOUS | Status: DC

## 2022-05-24 MED ORDER — ALBUTEROL SULFATE (2.5 MG/3ML) 0.083% IN NEBU: 2.5000 mg | INHALATION_SOLUTION | RESPIRATORY_TRACT | Status: DC | PRN

## 2022-05-24 MED ORDER — LORAZEPAM 2 MG/ML IJ SOLN: 2.0000 mg | INTRAMUSCULAR | Status: DC | PRN

## 2022-05-24 MED ORDER — ACETAMINOPHEN 650 MG RE SUPP: 650.0000 mg | Freq: Four times a day (QID) | RECTAL | Status: DC | PRN

## 2022-05-24 MED ORDER — OLANZAPINE 5 MG PO TABS: 5.0000 mg | ORAL_TABLET | Freq: Three times a day (TID) | ORAL | Status: DC

## 2022-05-24 MED ORDER — OLANZAPINE 5 MG PO TABS
5.0000 mg | ORAL_TABLET | Freq: Two times a day (BID) | ORAL | Status: DC
  Administered 2022-05-25 – 2022-05-28 (×7): 5 mg via ORAL
  Filled 2022-05-24 (×8): qty 1

## 2022-05-24 MED ORDER — LEVOTHYROXINE SODIUM 50 MCG PO TABS
75.0000 ug | ORAL_TABLET | Freq: Every day | ORAL | Status: DC
  Administered 2022-05-25 – 2022-05-28 (×3): 75 ug via ORAL
  Filled 2022-05-24 (×4): qty 2

## 2022-05-24 MED ORDER — PANTOPRAZOLE SODIUM 40 MG PO TBEC
40.0000 mg | DELAYED_RELEASE_TABLET | Freq: Every day | ORAL | Status: DC
  Administered 2022-05-25 – 2022-05-28 (×4): 40 mg via ORAL
  Filled 2022-05-24 (×4): qty 1

## 2022-05-24 MED ORDER — CARBAMAZEPINE ER 200 MG PO TB12
200.0000 mg | ORAL_TABLET | Freq: Two times a day (BID) | ORAL | Status: DC
  Administered 2022-05-25 – 2022-05-26 (×5): 200 mg via ORAL
  Filled 2022-05-24 (×7): qty 1

## 2022-05-24 MED ORDER — TAMSULOSIN HCL 0.4 MG PO CAPS
0.4000 mg | ORAL_CAPSULE | Freq: Every day | ORAL | Status: DC
  Administered 2022-05-25 – 2022-05-27 (×3): 0.4 mg via ORAL
  Filled 2022-05-24 (×4): qty 1

## 2022-05-24 MED ORDER — HALOPERIDOL LACTATE 5 MG/ML IJ SOLN
1.0000 mg | Freq: Once | INTRAMUSCULAR | Status: AC
  Administered 2022-05-24: 1 mg via INTRAVENOUS
  Filled 2022-05-24: qty 1

## 2022-05-24 MED ORDER — LORAZEPAM 2 MG/ML IJ SOLN
1.0000 mg | Freq: Once | INTRAMUSCULAR | Status: AC
  Administered 2022-05-24: 1 mg via INTRAVENOUS
  Filled 2022-05-24: qty 1

## 2022-05-24 MED ORDER — BACLOFEN 10 MG PO TABS
20.0000 mg | ORAL_TABLET | Freq: Three times a day (TID) | ORAL | Status: DC
  Administered 2022-05-24 – 2022-05-28 (×11): 20 mg via ORAL
  Filled 2022-05-24 (×11): qty 2

## 2022-05-24 MED ORDER — SODIUM CHLORIDE 0.9 % IV BOLUS
500.0000 mL | Freq: Once | INTRAVENOUS | Status: AC
  Administered 2022-05-24: 500 mL via INTRAVENOUS

## 2022-05-24 MED ORDER — AZITHROMYCIN 500 MG PO TABS
500.0000 mg | ORAL_TABLET | Freq: Once | ORAL | Status: AC
  Administered 2022-05-24: 500 mg via ORAL
  Filled 2022-05-24: qty 1

## 2022-05-24 MED ORDER — ONDANSETRON HCL 4 MG/2ML IJ SOLN: 4.0000 mg | Freq: Three times a day (TID) | INTRAMUSCULAR | Status: DC | PRN

## 2022-05-24 MED ORDER — LORAZEPAM 1 MG PO TABS: 1.0000 mg | ORAL_TABLET | Freq: Three times a day (TID) | ORAL | Status: DC | PRN

## 2022-05-24 MED ORDER — ACETAMINOPHEN 325 MG PO TABS: 650.0000 mg | ORAL_TABLET | Freq: Four times a day (QID) | ORAL | Status: DC | PRN

## 2022-05-24 MED ORDER — MIRTAZAPINE 15 MG PO TABS
15.0000 mg | ORAL_TABLET | Freq: Every day | ORAL | Status: DC
  Administered 2022-05-24 – 2022-05-27 (×4): 15 mg via ORAL
  Filled 2022-05-24 (×4): qty 1

## 2022-05-24 MED ORDER — SUCRALFATE 1 GM/10ML PO SUSP
1.0000 g | Freq: Three times a day (TID) | ORAL | Status: DC
  Administered 2022-05-25 – 2022-05-28 (×11): 1 g via ORAL
  Filled 2022-05-24 (×14): qty 10

## 2022-05-24 MED ORDER — ADULT MULTIVITAMIN W/MINERALS CH
1.0000 | ORAL_TABLET | Freq: Every day | ORAL | Status: DC
  Administered 2022-05-25 – 2022-05-27 (×3): 1 via ORAL
  Filled 2022-05-24 (×3): qty 1

## 2022-05-24 MED ORDER — DOCUSATE SODIUM 100 MG PO CAPS
100.0000 mg | ORAL_CAPSULE | Freq: Two times a day (BID) | ORAL | Status: DC
  Administered 2022-05-25 – 2022-05-26 (×4): 100 mg via ORAL
  Filled 2022-05-24 (×8): qty 1

## 2022-05-24 MED ORDER — PREDNISONE 20 MG PO TABS
40.0000 mg | ORAL_TABLET | Freq: Every day | ORAL | Status: DC
  Administered 2022-05-24 – 2022-05-28 (×5): 40 mg via ORAL
  Filled 2022-05-24 (×5): qty 2

## 2022-05-24 MED ORDER — OLANZAPINE 10 MG PO TABS
10.0000 mg | ORAL_TABLET | Freq: Every day | ORAL | Status: DC
  Administered 2022-05-24 – 2022-05-27 (×4): 10 mg via ORAL
  Filled 2022-05-24 (×4): qty 1

## 2022-05-24 MED ORDER — KETOTIFEN FUMARATE 0.035 % OP SOLN: 1.0000 [drp] | Freq: Two times a day (BID) | OPHTHALMIC | Status: DC | PRN

## 2022-05-24 MED ORDER — HALOPERIDOL LACTATE 5 MG/ML IJ SOLN
5.0000 mg | Freq: Once | INTRAMUSCULAR | Status: AC
  Administered 2022-05-24: 5 mg via INTRAMUSCULAR
  Filled 2022-05-24: qty 1

## 2022-05-24 MED ORDER — ENOXAPARIN SODIUM 40 MG/0.4ML IJ SOSY: 40.0000 mg | PREFILLED_SYRINGE | INTRAMUSCULAR | Status: DC

## 2022-05-24 MED ORDER — DICYCLOMINE HCL 10 MG PO CAPS
10.0000 mg | ORAL_CAPSULE | Freq: Three times a day (TID) | ORAL | Status: DC
  Administered 2022-05-25 – 2022-05-28 (×13): 10 mg via ORAL
  Filled 2022-05-24 (×15): qty 1

## 2022-05-24 MED ORDER — MULTIVITAMINS PO CAPS: 1.0000 | ORAL_CAPSULE | Freq: Every day | ORAL | Status: DC

## 2022-05-24 MED ORDER — LORATADINE 10 MG PO TABS
10.0000 mg | ORAL_TABLET | Freq: Every day | ORAL | Status: DC
  Administered 2022-05-25 – 2022-05-28 (×4): 10 mg via ORAL
  Filled 2022-05-24 (×4): qty 1

## 2022-05-24 MED ORDER — IOHEXOL 350 MG/ML SOLN
75.0000 mL | Freq: Once | INTRAVENOUS | Status: AC | PRN
  Administered 2022-05-24: 75 mL via INTRAVENOUS

## 2022-05-24 MED ORDER — ENOXAPARIN SODIUM 30 MG/0.3ML IJ SOSY
30.0000 mg | PREFILLED_SYRINGE | INTRAMUSCULAR | Status: DC
  Administered 2022-05-24 – 2022-05-25 (×2): 30 mg via SUBCUTANEOUS
  Filled 2022-05-24 (×2): qty 0.3

## 2022-05-24 MED ORDER — HYDROXYZINE HCL 25 MG PO TABS
25.0000 mg | ORAL_TABLET | Freq: Three times a day (TID) | ORAL | Status: DC
  Administered 2022-05-24 – 2022-05-28 (×11): 25 mg via ORAL
  Filled 2022-05-24 (×11): qty 1

## 2022-05-24 MED ORDER — SODIUM CHLORIDE 0.9 % IV SOLN
2.0000 g | INTRAVENOUS | Status: DC
  Administered 2022-05-25 – 2022-05-26 (×2): 2 g via INTRAVENOUS
  Filled 2022-05-24: qty 2
  Filled 2022-05-24 (×2): qty 20

## 2022-05-24 MED ORDER — POLYSACCHARIDE IRON COMPLEX 150 MG PO CAPS
150.0000 mg | ORAL_CAPSULE | Freq: Every day | ORAL | Status: DC
  Administered 2022-05-25 – 2022-05-27 (×3): 150 mg via ORAL
  Filled 2022-05-24 (×4): qty 1

## 2022-05-24 MED ORDER — GUAIFENESIN 100 MG/5ML PO LIQD: 200.0000 mg | ORAL | Status: DC | PRN

## 2022-05-24 MED ORDER — SODIUM CHLORIDE 0.9 % IV SOLN
2.0000 g | Freq: Once | INTRAVENOUS | Status: AC
  Administered 2022-05-24: 2 g via INTRAVENOUS
  Filled 2022-05-24: qty 20

## 2022-05-24 NOTE — ED Triage Notes (Signed)
Patient to ED for vomiting x2 days. Patient currently on antibiotic for UTI. States virus going around near them. Non-verbal at baseline- cerebral palsy. Brought in by house manger from group home.

## 2022-05-24 NOTE — H&P (Addendum)
History and Physical    [name] UEA:[id number] DOB: [birth year] DOA: 05/24/2022  Referring MD/NP/PA:   PCP: Lauro Regulus, MD   Patient coming from:  The patient is coming from group home.     Chief Complaint: nausea and vomiting  HPI: [name] is a 57 y.o. female with medical history significant of cerebral palsy, nonverbel, mental retardation, seizure, scoliosis, myopia, hypothyroidism, kidney stone, who presents with nausea vomiting.  Patient has hx of cerebral palsy and mental retardation, nonverbal at baseline, cannot provide any medical history, therefore, most of the history is obtained by discussing the case with ED physician, per EMS report, and with the nursing staff.  I also called patient's mother who provided limited history.  Per report, patient has dry cough, nausea and multiple episodes of nonbilious nonbloody vomiting for 2 days.  Patient has poor appetite, decreased oral intake in the past several days.  Patient is currently being treated for UTI with macrobid. They were unable to get a urine sample and so they treated her prophylactically.  No active respiratory distress, diarrhea noted.  Not sure if patient has symptoms of UTI.  Patient has agitation.  Per mother, patient would bite people.  Data reviewed independently and ED Course: pt was found to have positive COVID PCR, WBC 4.3, GFR> 60, platelet 105, temperature normal, blood pressure 141/88, heart rate 106, RR 20, oxygen saturation 91-99% on room air.  Chest x-ray showed patchy bilateral opacity, right side is worse than the left.  CT head is negative for acute intracranial abnormalities.  Patient is admitted to telemetry bed as inpatient.  CTA: 1. No evidence of pulmonary embolism. 2. Moderate left pleural effusion with associated relaxation atelectasis.   CT ABDOMEN/PELVIS:  1. Subtle 2.8 x 2.5 cm hypoenhancing lesion along the medial inferior right hepatic lobe, not well seen on  prior noncontrast-enhanced examination. Recommend further evaluation with nonemergent contrast-enhanced MRI. 2. Small volume right sided free fluid. 3. Marked interval decrease in size of the right kidney. 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 A few scattered additional right renal stones measuring up to 3 mm in the interpolar right kidney. 4. Large hiatal hernia. 5. Aortic Atherosclerosis (ICD10-I70.0). Coronary artery calcifications. Assessment for potential risk factor modification, dietary therapy or pharmacologic therapy may be warranted, if clinically indicated.   EKG: I have personally reviewed.  Sinus rhythm, QTc 432, T wave inversion in lead III/aVF   Review of Systems: Could not reviewed due to mental retardation and nonverbal status.  Allergy:  Allergies  Allergen Reactions   Amantadines Nausea And Vomiting    Past Medical History:  Diagnosis Date   Acne    Cerebral palsy (HCC)    Kidney stones    Mental retardation    Myopia    Scoliosis    Seizure disorder (HCC)     Past Surgical History:  Procedure Laterality Date   PARTIAL HIP ARTHROPLASTY     REVISION TOTAL KNEE ARTHROPLASTY      Social History:  reports that she has never smoked. She has never used smokeless tobacco. She reports that she does not drink alcohol and does not use drugs.  Family History:  Family History  Problem Relation Age of Onset   Breast cancer Neg Hx    Ovarian cancer Neg Hx    Colon cancer Neg Hx      Prior to Admission medications   Medication Sig Start Date End Date Taking? Authorizing Provider  aspirin 325  MG EC tablet Take 325 mg by mouth daily.   Yes [provider]  azelastine (OPTIVAR) 0.05 % ophthalmic solution Place 1 drop into both eyes 2 (two) times daily.   Yes [provider]  baclofen (LIORESAL) 20 MG tablet Take 20 mg by mouth 3 (three) times daily.   Yes [provider]  carbamazepine (TEGRETOL XR) 200 MG 12 hr  tablet Take 200 mg by mouth 2 (two) times daily.   Yes [provider]  cetirizine (ZYRTEC) 10 MG tablet Take 10 mg by mouth daily as needed for allergies.   Yes [provider]  dicyclomine (BENTYL) 10 MG capsule Take 10 mg by mouth 4 (four) times daily -  before meals and at bedtime.   Yes [provider]  Docusate Sodium (DSS) 100 MG CAPS Take 100 mg by mouth 2 (two) times daily. 05/14/21  Yes [provider]  furosemide (LASIX) 20 MG tablet Take 20 mg by mouth as needed.    Yes [provider]  hydrOXYzine (ATARAX/VISTARIL) 25 MG tablet Take 25 mg by mouth 3 (three) times daily. 09/06/19  Yes [provider]  iron polysaccharides (NIFEREX) 150 MG capsule Take 1 capsule by mouth daily. 09/01/19  Yes [provider]  levothyroxine (SYNTHROID) 75 MCG tablet Take 75 mcg by mouth daily before breakfast.   Yes [provider]  LORazepam (ATIVAN) 1 MG tablet Take 1 mg by mouth as needed.    Yes [provider]  metoCLOPramide (REGLAN) 5 MG tablet Take 5 mg by mouth 4 (four) times daily as needed for nausea or vomiting.   Yes [provider]  mirtazapine (REMERON) 15 MG tablet Take 15 mg by mouth at bedtime.   Yes [provider]  Multiple Vitamin (MULTIVITAMIN) capsule Take 1 capsule by mouth daily.   Yes [provider]  nitrofurantoin, macrocrystal-monohydrate, (MACROBID) 100 MG capsule Take 1 capsule (100 mg total) by mouth 2 (two) times daily. 02/25/20  Yes Linzie Collin, MD  OLANZapine (ZYPREXA) 5 MG tablet Take 5-10 mg by mouth 3 (three) times daily.   Yes [provider]  omeprazole (PRILOSEC) 20 MG capsule Take 20 mg by mouth 2 (two) times daily.    Yes [provider]  Skin Protectants, Misc. (EUCERIN) cream Apply topically as needed for dry skin.   Yes [provider]  sucralfate (CARAFATE) 1 GM/10ML suspension Take 1 g by mouth 4 (four) times daily -  with  meals and at bedtime.   Yes [provider]  Sulfacetamide Sodium, Acne, 10 % LOTN Apply topically 2 (two) times daily.    Yes [provider]  tamsulosin (FLOMAX) 0.4 MG CAPS capsule Take 0.4 mg by mouth at bedtime.   Yes [provider]  cholecalciferol (VITAMIN D) 1000 units tablet Take 1,250 Units by mouth every other day.  Patient not taking: Reported on 05/24/2022    [provider]  Poly Fe Cmplx-FeHemPoly-FA-B12 22-6-1-0.025 MG TABS Take 1 tablet by mouth. Patient not taking: Reported on 05/24/2022    [provider]  POLYSACCH FE CMP-FE HEME POLY PO Take 150 mg by mouth daily. Patient not taking: Reported on 05/24/2022    [provider]  potassium chloride (K-DUR,KLOR-CON) 10 MEQ tablet Take 10 mEq by mouth 2 (two) times daily. Patient not taking: Reported on 05/24/2022    [provider]  UNABLE TO FIND Med Name: CBD Oil 500mg /69mL.  1 dropper under tongue BID.    [provider]    Physical Exam: Vitals:   05/24/22 1330 05/24/22 1400 05/24/22 1430 05/24/22 1835  BP: 138/72 (!) 161/105  (!) 149/82  Pulse: 89 100 (!) 101 (!) 105  Resp: 17 (!) 26 20 20   Temp:    98.8 F (37.1 C)  TempSrc:    Axillary  SpO2: 93% 92% 94% 95%   General: Not in acute distress HEENT:       Eyes: PERRL, EOMI, no scleral icterus.       ENT: No discharge from the ears and nose, no pharynx injection, no tonsillar enlargement.        Neck: No JVD, no bruit, no mass felt. Heme: No neck lymph node enlargement. Cardiac: S1/S2, RRR, No murmurs, No gallops or rubs. Respiratory: No rales, wheezing, rhonchi or rubs. GI: Soft, nondistended, nontender, no organomegaly, BS present. GU: No hematuria Ext: No pitting leg edema bilaterally. 1+DP/PT pulse bilaterally. Musculoskeletal: No joint deformities, No joint redness or warmth, no limitation of ROM in spin. Skin: No rashes.  Neuro: Nonverbal status. cranial nerves II-XII grossly intact, moves  all extremities slightly Psych: Patient is not psychotic, no suicidal or hemocidal ideation.  Labs on Admission: I have personally reviewed following labs and imaging studies  CBC: Recent Labs  Lab 05/24/22 1251  WBC 4.3  HGB 13.1  HCT 42.7  MCV 93.8  PLT 108*   Basic Metabolic Panel: Recent Labs  Lab 05/24/22 1251  NA 145  K 4.2  CL 108  CO2 28  GLUCOSE 108*  BUN 23*  CREATININE 0.71  CALCIUM 8.8*   GFR: CrCl cannot be calculated (Unknown ideal weight.). Liver Function Tests: Recent Labs  Lab 05/24/22 1251  AST 40  ALT 38  ALKPHOS 212*  BILITOT 0.6  PROT 7.2  ALBUMIN 3.5   Recent Labs  Lab 05/24/22 1251  LIPASE 27   No results for input(s): "AMMONIA" in the last 168 hours. Coagulation Profile: No results for input(s): "INR", "PROTIME" in the last 168 hours. Cardiac Enzymes: No results for input(s): "CKTOTAL", "CKMB", "CKMBINDEX", "TROPONINI" in the last 168 hours. BNP (last 3 results) No results for input(s): "PROBNP" in the last 8760 hours. HbA1C: No results for input(s): "HGBA1C" in the last 72 hours. CBG: No results for input(s): "GLUCAP" in the last 168 hours. Lipid Profile: No results for input(s): "CHOL", "HDL", "LDLCALC", "TRIG", "CHOLHDL", "LDLDIRECT" in the last 72 hours. Thyroid Function Tests: No results for input(s): "TSH", "T4TOTAL", "FREET4", "T3FREE", "THYROIDAB" in the last 72 hours. Anemia Panel: No results for input(s): "VITAMINB12", "FOLATE", "FERRITIN", "TIBC", "IRON", "RETICCTPCT" in the last 72 hours. Urine analysis:    Component Value Date/Time   COLORURINE AMBER (A) 05/24/2022 1251   APPEARANCEUR HAZY (A) 05/24/2022 1251   LABSPEC 1.024 05/24/2022 1251   PHURINE 5.0 05/24/2022 1251   GLUCOSEU NEGATIVE 05/24/2022 1251   HGBUR SMALL (A) 05/24/2022 1251   BILIRUBINUR NEGATIVE 05/24/2022 1251   KETONESUR NEGATIVE 05/24/2022 1251   PROTEINUR 30 (A) 05/24/2022 1251   NITRITE POSITIVE (A) 05/24/2022 1251   LEUKOCYTESUR  TRACE (A) 05/24/2022 1251   Sepsis Labs: @LABRCNTIP (procalcitonin:4,lacticidven:4) ) Recent Results (from the past 240 hour(s))  SARS Coronavirus 2 by RT PCR (hospital order, performed in [facility] hospital lab) *cepheid single result test* Urine, Clean Catch     Status: Abnormal   Collection Time: 05/24/22 12:51 PM   Specimen: Urine, Clean Catch; Nasal Swab  Result Value Ref Range Status   SARS Coronavirus 2 by RT PCR POSITIVE (A)  NEGATIVE Final    Comment: (NOTE) SARS-CoV-2 target nucleic acids are DETECTED  SARS-CoV-2 RNA is generally detectable in upper respiratory specimens  during the acute phase of infection.  Positive results are indicative  of the presence of the identified virus, but do not rule out bacterial infection or co-infection with other pathogens not detected by the test.  Clinical correlation with patient history and  other diagnostic information is necessary to determine patient infection status.  The expected result is negative.  Fact Sheet for Patients:   RoadLapTop.co.za   Fact Sheet for Healthcare Providers:   http://kim-miller.com/    This test is not yet approved or cleared by the [place] FDA and  has been authorized for detection and/or diagnosis of SARS-CoV-2 by FDA under an Emergency Use Authorization (EUA).  This EUA will remain in effect (meaning this test can be used) for the duration of  the COVID-19 declaration under Section 564(b)(1)  of the Act, 21 U.S.C. section 360-bbb-3(b)(1), unless the authorization is terminated or revoked sooner.   Performed at [facility], [address] [place], [place] [address]      Radiological Exams on Admission: CT Angio Chest PE W and/or Wo Contrast  Result Date: 05/24/2022 CLINICAL DATA:  Urinary tract infection and COVID positive with hypoxia, abdominal pain, and vomiting EXAM: CT ANGIOGRAPHY CHEST CT ABDOMEN AND PELVIS WITH CONTRAST  TECHNIQUE: Multidetector CT imaging of the chest was performed using the standard protocol during bolus administration of intravenous contrast. Multiplanar CT image reconstructions and MIPs were obtained to evaluate the vascular anatomy. Multidetector CT imaging of the abdomen and pelvis was performed using the standard protocol during bolus administration of intravenous contrast. RADIATION DOSE REDUCTION: This exam was performed according to the departmental dose-optimization program which includes automated exposure control, adjustment of the mA and/or kV according to patient size and/or use of iterative reconstruction technique. CONTRAST:  75mL OMNIPAQUE IOHEXOL 350 MG/ML SOLN COMPARISON:  CT abdomen and pelvis dated 03/04/2021 FINDINGS: CTA CHEST FINDINGS Cardiovascular: The study is high quality for the evaluation of pulmonary embolism. There are no filling defects in the central, lobar, segmental or subsegmental pulmonary artery branches to suggest acute pulmonary embolism. Great vessels are normal in course and caliber. Normal heart size. No significant pericardial fluid/thickening. Coronary artery calcifications. Mediastinum/Nodes: Imaged thyroid gland without nodules meeting criteria for imaging follow-up by size. Large hiatal hernia. No pathologically enlarged axillary, supraclavicular, mediastinal, or hilar lymph nodes. Lungs/Pleura: The central airways are patent. Mild diffuse bronchial wall thickening. Left lower lobe relaxation atelectasis. No pneumothorax. Moderate left pleural effusion. Musculoskeletal: No acute or abnormal lytic or blastic osseous lesions. Review of the MIP images confirms the above findings. CT ABDOMEN and PELVIS FINDINGS Hepatobiliary: Subtle 2.8 x 2.5 cm hypoenhancing lesion along the medial inferior right hepatic lobe (4:30) not well seen on prior noncontrast-enhanced examination. 1.3 x 1.1 cm arterially enhancing focus within peripheral segment 5 (4:19) possibly flash filling  hemangioma or perfusional variation no intra or extrahepatic biliary ductal dilation. Normal gallbladder. Pancreas: No focal lesions or main ductal dilation. Spleen: Normal in size without focal abnormality. Adrenals/Urinary Tract: No adrenal nodules. Marked interval decrease in size of the right kidney. 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 A few scattered additional right renal stones measuring up to 3 mm in the interpolar right kidney. No suspicious renal mass or hydronephrosis. No focal bladder wall thickening. Stomach/Bowel: Normal appearance of the stomach. No evidence of bowel wall thickening, distention, or inflammatory changes. Appendix  is not discretely seen. Vascular/Lymphatic: Aortic atherosclerosis. No enlarged abdominal or pelvic lymph nodes. Reproductive: No adnexal masses. Other: Small volume right sided free fluid. No free air or fluid collection. Musculoskeletal: No acute or abnormal lytic or blastic osseous lesions. Postsurgical changes from thoracolumbar spinal fusion. Unchanged fracture of the right fusion rod. IMPRESSION: CTA CHEST: 1. No evidence of pulmonary embolism. 2. Moderate left pleural effusion with associated relaxation atelectasis. CT ABDOMEN/PELVIS: 1. Subtle 2.8 x 2.5 cm hypoenhancing lesion along the medial inferior right hepatic lobe, not well seen on prior noncontrast-enhanced examination. Recommend further evaluation with nonemergent contrast-enhanced MRI. 2. Small volume right sided free fluid. 3. Marked interval decrease in size of the right kidney. 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 A few scattered additional right renal stones measuring up to 3 mm in the interpolar right kidney. 4. Large hiatal hernia. 5. Aortic Atherosclerosis (ICD10-I70.0). Coronary artery calcifications. Assessment for potential risk factor modification, dietary therapy or pharmacologic therapy may be warranted, if clinically indicated. Electronically Signed   By: Agustin Cree M.D.   On: 05/24/2022 15:53   CT ABDOMEN PELVIS W CONTRAST  Result Date: 05/24/2022 CLINICAL DATA:  Urinary tract infection and COVID positive with hypoxia, abdominal pain, and vomiting EXAM: CT ANGIOGRAPHY CHEST CT ABDOMEN AND PELVIS WITH CONTRAST TECHNIQUE: Multidetector CT imaging of the chest was performed using the standard protocol during bolus administration of intravenous contrast. Multiplanar CT image reconstructions and MIPs were obtained to evaluate the vascular anatomy. Multidetector CT imaging of the abdomen and pelvis was performed using the standard protocol during bolus administration of intravenous contrast. RADIATION DOSE REDUCTION: This exam was performed according to the departmental dose-optimization program which includes automated exposure control, adjustment of the mA and/or kV according to patient size and/or use of iterative reconstruction technique. CONTRAST:  75mL OMNIPAQUE IOHEXOL 350 MG/ML SOLN COMPARISON:  CT abdomen and pelvis dated 03/04/2021 FINDINGS: CTA CHEST FINDINGS Cardiovascular: The study is high quality for the evaluation of pulmonary embolism. There are no filling defects in the central, lobar, segmental or subsegmental pulmonary artery branches to suggest acute pulmonary embolism. Great vessels are normal in course and caliber. Normal heart size. No significant pericardial fluid/thickening. Coronary artery calcifications. Mediastinum/Nodes: Imaged thyroid gland without nodules meeting criteria for imaging follow-up by size. Large hiatal hernia. No pathologically enlarged axillary, supraclavicular, mediastinal, or hilar lymph nodes. Lungs/Pleura: The central airways are patent. Mild diffuse bronchial wall thickening. Left lower lobe relaxation atelectasis. No pneumothorax. Moderate left pleural effusion. Musculoskeletal: No acute or abnormal lytic or blastic osseous lesions. Review of the MIP images confirms the above findings. CT ABDOMEN and PELVIS FINDINGS  Hepatobiliary: Subtle 2.8 x 2.5 cm hypoenhancing lesion along the medial inferior right hepatic lobe (4:30) not well seen on prior noncontrast-enhanced examination. 1.3 x 1.1 cm arterially enhancing focus within peripheral segment 5 (4:19) possibly flash filling hemangioma or perfusional variation no intra or extrahepatic biliary ductal dilation. Normal gallbladder. Pancreas: No focal lesions or main ductal dilation. Spleen: Normal in size without focal abnormality. Adrenals/Urinary Tract: No adrenal nodules. Marked interval decrease in size of the right kidney. 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 A few scattered additional right renal stones measuring up to 3 mm in the interpolar right kidney. No suspicious renal mass or hydronephrosis. No focal bladder wall thickening. Stomach/Bowel: Normal appearance of the stomach. No evidence of bowel wall thickening, distention, or inflammatory changes. Appendix is not discretely seen. Vascular/Lymphatic: Aortic atherosclerosis. No enlarged abdominal or pelvic lymph nodes.  Reproductive: No adnexal masses. Other: Small volume right sided free fluid. No free air or fluid collection. Musculoskeletal: No acute or abnormal lytic or blastic osseous lesions. Postsurgical changes from thoracolumbar spinal fusion. Unchanged fracture of the right fusion rod. IMPRESSION: CTA CHEST: 1. No evidence of pulmonary embolism. 2. Moderate left pleural effusion with associated relaxation atelectasis. CT ABDOMEN/PELVIS: 1. Subtle 2.8 x 2.5 cm hypoenhancing lesion along the medial inferior right hepatic lobe, not well seen on prior noncontrast-enhanced examination. Recommend further evaluation with nonemergent contrast-enhanced MRI. 2. Small volume right sided free fluid. 3. Marked interval decrease in size of the right kidney. 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 A few scattered additional right renal stones measuring up to 3 mm in the interpolar right kidney. 4.  Large hiatal hernia. 5. Aortic Atherosclerosis (ICD10-I70.0). Coronary artery calcifications. Assessment for potential risk factor modification, dietary therapy or pharmacologic therapy may be warranted, if clinically indicated. Electronically Signed   By: Agustin Cree M.D.   On: 05/24/2022 15:53   CT HEAD WO CONTRAST ( )  Result Date: 05/24/2022 CLINICAL DATA:  Headache, sudden, severe. EXAM: CT HEAD WITHOUT CONTRAST TECHNIQUE: Contiguous axial images were obtained from the base of the skull through the vertex without intravenous contrast. RADIATION DOSE REDUCTION: This exam was performed according to the departmental dose-optimization program which includes automated exposure control, adjustment of the mA and/or kV according to patient size and/or use of iterative reconstruction technique. COMPARISON:  None Available. FINDINGS: Brain: No acute intracranial hemorrhage. 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 Cortical gray-white differentiation is otherwise preserved. No evidence of acute hydrocephalus. No extra-axial collection. No mass effect or midline shift. Vascular: No hyperdense vessel or unexpected calcification. Skull: No calvarial fracture or suspicious bone lesion. Skull base is unremarkable. Sinuses/Orbits: Unremarkable. Other: None. IMPRESSION: 1. No acute intracranial abnormality. 2. 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 Electronically Signed   By: Orvan Falconer M.D.   On: 05/24/2022 15:37   DG Chest Portable 1 View  Result Date: 05/24/2022 CLINICAL DATA:  Shortness of breath EXAM: PORTABLE CHEST 1 VIEW COMPARISON:  CXR 04/01/18 FINDINGS: No pleural effusion. No pneumothorax. Assessment of the lung apices is limited due to head positioning. Within this limitation there are patchy bilateral airspace opacities, right-greater-than-left. No radiographically apparent displaced rib fractures. Visualized upper abdomen is  unremarkable. Thoracolumbar spinal fusion hardware in place. IMPRESSION: Patchy bilateral airspace opacities, right-greater-than-left, concerning for multifocal pneumonia. Electronically Signed   By: Lorenza Cambridge M.D.   On: 05/24/2022 14:24      Assessment/Plan Principal Problem:   COVID-19 virus infection Active Problems:   UTI (urinary tract infection)   Nausea & vomiting   Seizure disorder (HCC)   Cerebral palsy (HCC)   Thrombocytopenia (HCC)   Hypothyroidism   Pleural effusion on left   Liver lesion   Assessment and Plan:  COVID-19 virus infection:  CXR showed  showed patchy bilateral opacity, right side is worse than the left. CTA negative for PE.  Oxygen saturation 91-99% on room air.  -Admitted to telemetry bed as inpatient -Current bronchodilators and Robitussin -Start prednisone 40 mg daily  UTI (urinary tract infection) -rocephin -f/u urine culture  Nausea & vomiting: Etiology is not clear.  Liver function okay, lipase normal.  CT of abdomen/pelvis showed no bowel obstruction.  May be due to UTI and COVID infection. -Supportive care -IV fluid: 500 cc normal saline, then 75 cc/h -As needed Zofran  Seizure disorder (HCC) -Seizure precaution -As needed Ativan for seizure -  Continue home Tegretol  Cerebral palsy and mental retardation:  patient has agitation. -Continue baclofen, hydroxyzine, as needed Ativan, olanzapine  Thrombocytopenia (HCC): Platelet 108.  Likely etiology.  May be due to COVID infection -Check LDH --> 178 -Peripheral smear  Hypothyroidism -Synthroid  Pleural effusion on left: -Will order thoracentesis  Liver lesion: This is incidental findings by CT scan. -Follow-up with PCP as outpatient workup      DVT ppx: SQ Lovenox  Code Status: DNR per her mother  Family Communication:   Yes, patient's mother by phone  Disposition Plan:  Anticipate discharge back to previous environment, group home  Consults called:  none  Admission  status and Level of care: Telemetry Medical:    as inpt     Dispo: The patient is from: Group home              Anticipated d/c is to: Group home              Anticipated d/c date is: 2 days              Patient currently is not medically stable to d/c.    Severity of Illness:  The appropriate patient status for this patient is INPATIENT. Inpatient status is judged to be reasonable and necessary in order to provide the required intensity of service to ensure the patient's safety. The patient's presenting symptoms, physical exam findings, and initial radiographic and laboratory data in the context of their chronic comorbidities is felt to place them at high risk for further clinical deterioration. Furthermore, it is not anticipated that the patient will be medically stable for discharge from the hospital within 2 midnights of admission.   * I certify that at the point of admission it is my clinical judgment that the patient will require inpatient hospital care spanning beyond 2 midnights from the point of admission due to high intensity of service, high risk for further deterioration and high frequency of surveillance required.*       Date of Service 05/24/2022    Lorretta Harp [facility]   If 7PM-7AM, please contact night-coverage [email or web address] 05/24/2022, 7:19 PM

## 2022-05-24 NOTE — ED Notes (Signed)
This RN fed pt dinner.

## 2022-05-24 NOTE — ED Notes (Signed)
Lab came to collect lactic per previous shift RN. Only one set of blood cultures obtained. Pt is a difficult stick.

## 2022-05-24 NOTE — ED Notes (Signed)
First Nurse Note: Pt to ED via [facility] for vomiting twice yesterday, decreased appetite. Pt recently treated for UTI.

## 2022-05-24 NOTE — ED Provider Notes (Addendum)
[facility] Provider Note    Event Date/Time   First MD Initiated Contact with Patient 05/24/22 1129     (approximate)   History   Abdominal Pain   HPI  [name] is a 57 y.o. female who comes in with concerns for abdominal pain.  Patient reports having vomiting for 2 days.  Patient is currently on antibiotics for UTI.  Patient is nonverbal at baseline with concern for cerebral palsy.  It appears that patient was treated for UTI due to concerns for foul-smelling urine and malaise.  They were unable to get a urine sample and so they treated her prophylactically.  However unfortunately yesterday she had 2 episodes of vomiting and 1 episode around lunchtime 1 episode around dinnertime.  They noted that she was not eating or drinking as much today but is had no more vomiting.  Still having normal bowel movements does not appear to be in any obvious pain.  History however is limited secondary to her being nonverbal.  I reviewed the notes from 4/30 where patient was started on Macrobid for 7 days.  Physical Exam   Triage Vital Signs: ED Triage Vitals  Enc Vitals Group     BP 05/24/22 1114 (!) 145/92     Pulse Rate 05/24/22 1114 93     Resp 05/24/22 1114 18     Temp 05/24/22 1114 98 F (36.7 C)     Temp Source 05/24/22 1107 Oral     SpO2 05/24/22 1114 99 %     Weight --      Height --      Head Circumference --      Peak Flow --      Pain Score --      Pain Loc --      Pain Edu? --      Excl. in GC? --     Most recent vital signs: Vitals:   05/24/22 1114  BP: (!) 145/92  Pulse: 93  Resp: 18  Temp: 98 F (36.7 C)  SpO2: 99%     General: Awake, no distress.  CV:  Good peripheral perfusion.  Resp:  Normal effort.  Abd:  No distention.  Seems soft and nontender Other:  Patient attempting to bite the gloves that are on her hand.  Occasionally making verbal noises that facility report is at baseline.   ED Results / Procedures /  Treatments   Labs (all labs ordered are listed, but only abnormal results are displayed) Labs Reviewed  SARS CORONAVIRUS 2 BY RT PCR - Abnormal; Notable for the following components:      Result Value   SARS Coronavirus 2 by RT PCR POSITIVE (*)    All other components within normal limits  COMPREHENSIVE METABOLIC PANEL - Abnormal; Notable for the following components:   Glucose, Bld 108 (*)    BUN 23 (*)    Calcium 8.8 (*)    Alkaline Phosphatase 212 (*)    All other components within normal limits  CBC - Abnormal; Notable for the following components:   Platelets 108 (*)    All other components within normal limits  URINALYSIS, ROUTINE W REFLEX MICROSCOPIC - Abnormal; Notable for the following components:   Color, Urine AMBER (*)    APPearance HAZY (*)    Hgb urine dipstick SMALL (*)    Protein, ur 30 (*)    Nitrite POSITIVE (*)    Leukocytes,Ua TRACE (*)    Bacteria, UA MANY (*)  All other components within normal limits  URINE CULTURE  LIPASE, BLOOD  TROPONIN I (HIGH SENSITIVITY)  TROPONIN I (HIGH SENSITIVITY)      EKG  My interpretation of EKG:  Sinus tachycardic rate of 106 that any ST elevation, T wave version lead III, AVF, normal intervals  Reviewed prior EKGs and she has had similar T wave inversions.   RADIOLOGY I have reviewed the xray personally and interpretted and no pna but pt is rotated  PROCEDURES:  Critical Care performed: No  .1-3 Lead EKG Interpretation  Performed by: Concha Se, MD Authorized by: Concha Se, MD     Interpretation: normal     ECG rate:  100   ECG rate assessment: normal     Rhythm: sinus rhythm     Ectopy: none     Conduction: normal      MEDICATIONS ORDERED IN ED: Medications  haloperidol lactate (HALDOL) injection 5 mg (5 mg Intramuscular Given 05/24/22 1235)     IMPRESSION / MDM / ASSESSMENT AND PLAN / ED COURSE  I reviewed the triage vital signs and the nursing notes.   Patient's presentation is  most consistent with acute presentation with potential threat to life or bodily function.   Patient's differential for vomiting yesterday and increasing malaise and not eating or drinking today is very broad and very limited secondary to nonverbal nature.  Did call patient's guardian her mother to figure out goals given her reassuring vitals I did discuss with just the monitoring at the facility for 24 hours without any interventions but she wanted [place] to proceed with medications to help sedate patient in order to get the urine culture to make sure that the urine was successfully being treated.  Guardian also reports history of kidney stones that have been missed previously and patient does have a history of bacteremia from obstructed kidney stones.  Discussed with her that she has no fever at this time but we discussed benefits and risk of potentially a CT scan and she would like to proceed.  Lipase is normal.  CMP similar to prior CBC reassuring urine with concern for UTI does have some small blood in it family report history of kidney stone is hard to know if patient has any symptoms or any of the vomiting or pain given limited history we will proceed with CT imaging.  Sats are 90 to 93% will get CT PE and CT head given concern for the lethargy.  Patient was given 1 mg of Ativan to help facilitate.  Patient is COVID-positive this could be causing the nausea and vomiting.  Patient be handed off to oncoming team pending CT imaging.  Patient had to be given an additional dose of Haldol and Ativan to help facilitate CT imaging I have started some broad-spectrum antibiotics and will get blood cultures, lactate but seems less likely to be septic at this time Her oxygen levels are satting around 90 to 91%.  Given the concern for failing outpatient treatment and tachycardia and now x-ray showing multifocal pneumonia I will discuss with hospital team for admission for IV antibiotics.  I have ordered CT imaging just  to ensure there is no evidence of obstructing kidney stone given prior history of this to see if they would need a stent but she is afebrile.  I will discuss the hospital team for admission   The patient is on the cardiac monitor to evaluate for evidence of arrhythmia and/or significant heart rate changes.  FINAL CLINICAL IMPRESSION(S) / ED DIAGNOSES   Final diagnoses:  COVID-19  Urinary tract infection without hematuria, site unspecified     Rx / DC Orders   ED Discharge Orders     None        Note:  This document was prepared using Dragon voice recognition software and may include unintentional dictation errors.   Concha Se, MD 05/24/22 1425    Concha Se, MD 05/24/22 [phone]

## 2022-05-24 NOTE — Progress Notes (Signed)
PHARMACIST - PHYSICIAN COMMUNICATION  CONCERNING:  Enoxaparin (Lovenox) for DVT Prophylaxis    RECOMMENDATION: Patient was prescribed enoxaprin 40mg  q24 hours for VTE prophylaxis.   There were no vitals filed for this visit.  There is no height or weight on file to calculate BMI.  CrCl cannot be calculated (Unknown ideal weight.).  Patient is candidate for enoxaparin 30mg  every 24 hours based on CrCl <46ml/min or Weight <45kg  DESCRIPTION: Pharmacy has adjusted enoxaparin dose per [facility] policy.  Patient is now receiving enoxaparin 30 mg every 24 hours    Foye Deer, PharmD Clinical Pharmacist  05/24/2022 3:45 PM

## 2022-05-24 NOTE — ED Notes (Signed)
Pt brief changed. X1 urine, small bowel movement.

## 2022-05-25 DIAGNOSIS — U071 COVID-19: Secondary | ICD-10-CM | POA: Diagnosis not present

## 2022-05-25 DIAGNOSIS — G809 Cerebral palsy, unspecified: Secondary | ICD-10-CM | POA: Diagnosis not present

## 2022-05-25 DIAGNOSIS — N390 Urinary tract infection, site not specified: Secondary | ICD-10-CM | POA: Diagnosis not present

## 2022-05-25 LAB — BASIC METABOLIC PANEL
Anion gap: 9 (ref 5–15)
BUN: 17 mg/dL (ref 6–20)
CO2: 25 mmol/L (ref 22–32)
Calcium: 8.9 mg/dL (ref 8.9–10.3)
Chloride: 104 mmol/L (ref 98–111)
Creatinine, Ser: 0.68 mg/dL (ref 0.44–1.00)
GFR, Estimated: >60 mL/min (ref >60)
Glucose, Bld: 119 mg/dL — ABNORMAL HIGH (ref 70–99)
Potassium: 4.4 mmol/L (ref 3.5–5.1)
Sodium: 138 mmol/L (ref 135–145)

## 2022-05-25 LAB — CBC
HCT: 36.7 % (ref 36.0–46.0)
Hemoglobin: 11.6 g/dL — ABNORMAL LOW (ref 12.0–15.0)
MCH: 29.1 pg (ref 26.0–34.0)
MCHC: 31.6 g/dL (ref 30.0–36.0)
MCV: 92 fL (ref 80.0–100.0)
Platelets: 104 10*3/uL — ABNORMAL LOW (ref 150–400)
RBC: 3.99 MIL/uL (ref 3.87–5.11)
RDW: 14.4 % (ref 11.5–15.5)
WBC: 3.3 10*3/uL — ABNORMAL LOW (ref 4.0–10.5)
nRBC: 0 % (ref 0.0–0.2)

## 2022-05-25 LAB — CULTURE, BLOOD (ROUTINE X 2): Culture: NO GROWTH

## 2022-05-25 LAB — HIV ANTIBODY (ROUTINE TESTING W REFLEX): HIV Screen 4th Generation wRfx: NONREACTIVE

## 2022-05-25 LAB — PROCALCITONIN: Procalcitonin: <0.1 ng/mL

## 2022-05-25 LAB — LACTIC ACID, PLASMA: Lactic Acid, Venous: 1.5 mmol/L (ref 0.5–1.9)

## 2022-05-25 LAB — URINE CULTURE

## 2022-05-25 MED ORDER — FUROSEMIDE 10 MG/ML IJ SOLN
40.0000 mg | Freq: Once | INTRAMUSCULAR | Status: AC
  Administered 2022-05-25: 40 mg via INTRAVENOUS
  Filled 2022-05-25: qty 4

## 2022-05-25 MED ORDER — SODIUM CHLORIDE 0.9 % IV SOLN: INTRAVENOUS | Status: DC | PRN

## 2022-05-25 NOTE — ED Notes (Signed)
Pt medications delayed due to pt not wanting any more applesauce to take her medications with

## 2022-05-25 NOTE — Progress Notes (Signed)
PROGRESS NOTE    [name]  WUJ:[id number] DOB: 1965-04-22 DOA: 05/24/2022 PCP: Lauro Regulus, MD    Assessment & Plan:   Principal Problem:   COVID-19 virus infection Active Problems:   UTI (urinary tract infection)   Nausea & vomiting   Seizure disorder (HCC)   Cerebral palsy (HCC)   Thrombocytopenia (HCC)   Hypothyroidism   Pleural effusion on left   Liver lesion  Assessment and Plan: COVID-19 virus pneumonia: CXR showed patchy bilateral opacity, right side is worse than the left. CTA negative for PE. Continue on steroids, bronchodilators. Procal ordered. Continue w/ supportive care. Continue on airborne & contact precautions    UTI: UA is positive. Urine cx ordered.    Nausea & vomiting: etiology unclear. CT of abdomen/pelvis showed no bowel obstruction.  May be due to UTI and COVID infection. Zofran prn. Continue w/ supportive care    Seizure disorder: continue on home dose of tegretol. Ativan prn. Seizure precautions    Cerebral palsy and mental retardation: continue on home dose of baclofen, hydroxyzine, olanzapine. Ativan prn    Thrombocytopenia: possibly secondary to COVID19 infection    Hypothyroidism: continue on synthroid    Pleural effusion on left: thoracentesis ordered w/ fluid studies. IV laisx x 1 ordered   Liver lesion: incidental findings by CT scan. F/u outpatient w/ PCP        DVT prophylaxis: lovenox Code Status: DNR Family Communication:  Disposition Plan: likely d/c back to group home  Level of care: Telemetry Medical  Status is: Inpatient Remains inpatient appropriate because: severity of illness    Consultants:    Procedures:   Antimicrobials: rocephin    Subjective: Pt is nonverbal at baseline   Objective: Vitals:   05/24/22 2230 05/25/22 0006 05/25/22 0009 05/25/22 0451  BP: (!) 134/92 (!) 149/80  (!) 147/91  Pulse: 94 (!) 103  88  Resp: 16 18  18   Temp:  98.5 F (36.9 C)  98.6 F (37 C)  TempSrc:   Oral  Oral  SpO2: 100% 96%  97%  Weight:   48.3 kg   Height:   4\' 10"  (1.473 m)    No intake or output data in the 24 hours ending 05/25/22 0819 Filed Weights   05/25/22 0009  Weight: 48.3 kg    Examination:  General exam: Appears calm and comfortable  Respiratory system: course breath sounds b/l Cardiovascular system: S1 & S2+. No rubs, gallops or clicks.  Gastrointestinal system: Abdomen is nondistended, soft and nontender. Normal bowel sounds heard. Central nervous system: Alert and awake Psychiatry: Judgement and insight appears poor     Data Reviewed: I have personally reviewed following labs and imaging studies  CBC: Recent Labs  Lab 05/24/22 1251 05/25/22 0446  WBC 4.3 3.3*  HGB 13.1 11.6*  HCT 42.7 36.7  MCV 93.8 92.0  PLT 108* 104*   Basic Metabolic Panel: Recent Labs  Lab 05/24/22 1251 05/25/22 0446  NA 145 138  K 4.2 4.4  CL 108 104  CO2 28 25  GLUCOSE 108* 119*  BUN 23* 17  CREATININE 0.71 0.68  CALCIUM 8.8* 8.9   GFR: Estimated Creatinine Clearance: 49.5 mL/min (by C-G formula based on SCr of 0.68 mg/dL). Liver Function Tests: Recent Labs  Lab 05/24/22 1251  AST 40  ALT 38  ALKPHOS 212*  BILITOT 0.6  PROT 7.2  ALBUMIN 3.5   Recent Labs  Lab 05/24/22 1251  LIPASE 27   No results for input(s): "AMMONIA"  in the last 168 hours. Coagulation Profile: No results for input(s): "INR", "PROTIME" in the last 168 hours. Cardiac Enzymes: No results for input(s): "CKTOTAL", "CKMB", "CKMBINDEX", "TROPONINI" in the last 168 hours. BNP (last 3 results) No results for input(s): "PROBNP" in the last 8760 hours. HbA1C: No results for input(s): "HGBA1C" in the last 72 hours. CBG: No results for input(s): "GLUCAP" in the last 168 hours. Lipid Profile: No results for input(s): "CHOL", "HDL", "LDLCALC", "TRIG", "CHOLHDL", "LDLDIRECT" in the last 72 hours. Thyroid Function Tests: No results for input(s): "TSH", "T4TOTAL", "FREET4", "T3FREE",  "THYROIDAB" in the last 72 hours. Anemia Panel: No results for input(s): "VITAMINB12", "FOLATE", "FERRITIN", "TIBC", "IRON", "RETICCTPCT" in the last 72 hours. Sepsis Labs: Recent Labs  Lab 05/24/22 1815 05/25/22 0015  LATICACIDVEN 1.3 1.5    Recent Results (from the past 240 hour(s))  SARS Coronavirus 2 by RT PCR (hospital order, performed in [facility] hospital lab) *cepheid single result test* Urine, Clean Catch     Status: Abnormal   Collection Time: 05/24/22 12:51 PM   Specimen: Urine, Clean Catch; Nasal Swab  Result Value Ref Range Status   SARS Coronavirus 2 by RT PCR POSITIVE (A) NEGATIVE Final    Comment: (NOTE) SARS-CoV-2 target nucleic acids are DETECTED  SARS-CoV-2 RNA is generally detectable in upper respiratory specimens  during the acute phase of infection.  Positive results are indicative  of the presence of the identified virus, but do not rule out bacterial infection or co-infection with other pathogens not detected by the test.  Clinical correlation with patient history and  other diagnostic information is necessary to determine patient infection status.  The expected result is negative.  Fact Sheet for Patients:   RoadLapTop.co.za   Fact Sheet for Healthcare Providers:   http://kim-miller.com/    This test is not yet approved or cleared by the [place] FDA and  has been authorized for detection and/or diagnosis of SARS-CoV-2 by FDA under an Emergency Use Authorization (EUA).  This EUA will remain in effect (meaning this test can be used) for the duration of  the COVID-19 declaration under Section 564(b)(1)  of the Act, 21 U.S.C. section 360-bbb-3(b)(1), unless the authorization is terminated or revoked sooner.   Performed at [facility], [address] [place], [place] [address]   Blood culture (routine x 2)     Status: None (Preliminary result)   Collection Time: 05/24/22  6:20 PM    Specimen: BLOOD LEFT HAND  Result Value Ref Range Status   Specimen Description BLOOD LEFT HAND  Final   Special Requests   Final    BOTTLES DRAWN AEROBIC ONLY Blood Culture adequate volume   Culture   Final    NO GROWTH < 12 HOURS Performed at [facility], [address] [place], [place] [address]    Report Status PENDING  Incomplete  Blood culture (routine x 2)     Status: None (Preliminary result)   Collection Time: 05/25/22 12:15 AM   Specimen: BLOOD  Result Value Ref Range Status   Specimen Description BLOOD BLOOD RIGHT ARM  Final   Special Requests   Final    BOTTLES DRAWN AEROBIC AND ANAEROBIC Blood Culture adequate volume   Culture   Final    NO GROWTH < 12 HOURS Performed at [facility], [address], [place], [place] [address]    Report Status PENDING  Incomplete         Radiology Studies: CT Angio Chest PE  W and/or Wo Contrast  Result Date: 05/24/2022 CLINICAL DATA:  Urinary tract infection and COVID positive with hypoxia, abdominal pain, and vomiting EXAM: CT ANGIOGRAPHY CHEST CT ABDOMEN AND PELVIS WITH CONTRAST TECHNIQUE: Multidetector CT imaging of the chest was performed using the standard protocol during bolus administration of intravenous contrast. Multiplanar CT image reconstructions and MIPs were obtained to evaluate the vascular anatomy. Multidetector CT imaging of the abdomen and pelvis was performed using the standard protocol during bolus administration of intravenous contrast. RADIATION DOSE REDUCTION: This exam was performed according to the departmental dose-optimization program which includes automated exposure control, adjustment of the mA and/or kV according to patient size and/or use of iterative reconstruction technique. CONTRAST:  75mL OMNIPAQUE IOHEXOL 350 MG/ML SOLN COMPARISON:  CT abdomen and pelvis dated 03/04/2021 FINDINGS: CTA CHEST FINDINGS Cardiovascular: The study is high quality for the evaluation of pulmonary embolism.  There are no filling defects in the central, lobar, segmental or subsegmental pulmonary artery branches to suggest acute pulmonary embolism. Great vessels are normal in course and caliber. Normal heart size. No significant pericardial fluid/thickening. Coronary artery calcifications. Mediastinum/Nodes: Imaged thyroid gland without nodules meeting criteria for imaging follow-up by size. Large hiatal hernia. No pathologically enlarged axillary, supraclavicular, mediastinal, or hilar lymph nodes. Lungs/Pleura: The central airways are patent. Mild diffuse bronchial wall thickening. Left lower lobe relaxation atelectasis. No pneumothorax. Moderate left pleural effusion. Musculoskeletal: No acute or abnormal lytic or blastic osseous lesions. Review of the MIP images confirms the above findings. CT ABDOMEN and PELVIS FINDINGS Hepatobiliary: Subtle 2.8 x 2.5 cm hypoenhancing lesion along the medial inferior right hepatic lobe (4:30) not well seen on prior noncontrast-enhanced examination. 1.3 x 1.1 cm arterially enhancing focus within peripheral segment 5 (4:19) possibly flash filling hemangioma or perfusional variation no intra or extrahepatic biliary ductal dilation. Normal gallbladder. Pancreas: No focal lesions or main ductal dilation. Spleen: Normal in size without focal abnormality. Adrenals/Urinary Tract: No adrenal nodules. Marked interval decrease in size of the right kidney. 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 A few scattered additional right renal stones measuring up to 3 mm in the interpolar right kidney. No suspicious renal mass or hydronephrosis. No focal bladder wall thickening. Stomach/Bowel: Normal appearance of the stomach. No evidence of bowel wall thickening, distention, or inflammatory changes. Appendix is not discretely seen. Vascular/Lymphatic: Aortic atherosclerosis. No enlarged abdominal or pelvic lymph nodes. Reproductive: No adnexal masses. Other: Small volume right sided free  fluid. No free air or fluid collection. Musculoskeletal: No acute or abnormal lytic or blastic osseous lesions. Postsurgical changes from thoracolumbar spinal fusion. Unchanged fracture of the right fusion rod. IMPRESSION: CTA CHEST: 1. No evidence of pulmonary embolism. 2. Moderate left pleural effusion with associated relaxation atelectasis. CT ABDOMEN/PELVIS: 1. Subtle 2.8 x 2.5 cm hypoenhancing lesion along the medial inferior right hepatic lobe, not well seen on prior noncontrast-enhanced examination. Recommend further evaluation with nonemergent contrast-enhanced MRI. 2. Small volume right sided free fluid. 3. Marked interval decrease in size of the right kidney. 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 A few scattered additional right renal stones measuring up to 3 mm in the interpolar right kidney. 4. Large hiatal hernia. 5. Aortic Atherosclerosis (ICD10-I70.0). Coronary artery calcifications. Assessment for potential risk factor modification, dietary therapy or pharmacologic therapy may be warranted, if clinically indicated. Electronically Signed   By: Agustin Cree M.D.   On: 05/24/2022 15:53   CT ABDOMEN PELVIS W CONTRAST  Result Date: 05/24/2022 CLINICAL DATA:  Urinary tract infection  and COVID positive with hypoxia, abdominal pain, and vomiting EXAM: CT ANGIOGRAPHY CHEST CT ABDOMEN AND PELVIS WITH CONTRAST TECHNIQUE: Multidetector CT imaging of the chest was performed using the standard protocol during bolus administration of intravenous contrast. Multiplanar CT image reconstructions and MIPs were obtained to evaluate the vascular anatomy. Multidetector CT imaging of the abdomen and pelvis was performed using the standard protocol during bolus administration of intravenous contrast. RADIATION DOSE REDUCTION: This exam was performed according to the departmental dose-optimization program which includes automated exposure control, adjustment of the mA and/or kV according to patient size and/or  use of iterative reconstruction technique. CONTRAST:  75mL OMNIPAQUE IOHEXOL 350 MG/ML SOLN COMPARISON:  CT abdomen and pelvis dated 03/04/2021 FINDINGS: CTA CHEST FINDINGS Cardiovascular: The study is high quality for the evaluation of pulmonary embolism. There are no filling defects in the central, lobar, segmental or subsegmental pulmonary artery branches to suggest acute pulmonary embolism. Great vessels are normal in course and caliber. Normal heart size. No significant pericardial fluid/thickening. Coronary artery calcifications. Mediastinum/Nodes: Imaged thyroid gland without nodules meeting criteria for imaging follow-up by size. Large hiatal hernia. No pathologically enlarged axillary, supraclavicular, mediastinal, or hilar lymph nodes. Lungs/Pleura: The central airways are patent. Mild diffuse bronchial wall thickening. Left lower lobe relaxation atelectasis. No pneumothorax. Moderate left pleural effusion. Musculoskeletal: No acute or abnormal lytic or blastic osseous lesions. Review of the MIP images confirms the above findings. CT ABDOMEN and PELVIS FINDINGS Hepatobiliary: Subtle 2.8 x 2.5 cm hypoenhancing lesion along the medial inferior right hepatic lobe (4:30) not well seen on prior noncontrast-enhanced examination. 1.3 x 1.1 cm arterially enhancing focus within peripheral segment 5 (4:19) possibly flash filling hemangioma or perfusional variation no intra or extrahepatic biliary ductal dilation. Normal gallbladder. Pancreas: No focal lesions or main ductal dilation. Spleen: Normal in size without focal abnormality. Adrenals/Urinary Tract: No adrenal nodules. Marked interval decrease in size of the right kidney. 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 A few scattered additional right renal stones measuring up to 3 mm in the interpolar right kidney. No suspicious renal mass or hydronephrosis. No focal bladder wall thickening. Stomach/Bowel: Normal appearance of the stomach. No evidence  of bowel wall thickening, distention, or inflammatory changes. Appendix is not discretely seen. Vascular/Lymphatic: Aortic atherosclerosis. No enlarged abdominal or pelvic lymph nodes. Reproductive: No adnexal masses. Other: Small volume right sided free fluid. No free air or fluid collection. Musculoskeletal: No acute or abnormal lytic or blastic osseous lesions. Postsurgical changes from thoracolumbar spinal fusion. Unchanged fracture of the right fusion rod. IMPRESSION: CTA CHEST: 1. No evidence of pulmonary embolism. 2. Moderate left pleural effusion with associated relaxation atelectasis. CT ABDOMEN/PELVIS: 1. Subtle 2.8 x 2.5 cm hypoenhancing lesion along the medial inferior right hepatic lobe, not well seen on prior noncontrast-enhanced examination. Recommend further evaluation with nonemergent contrast-enhanced MRI. 2. Small volume right sided free fluid. 3. Marked interval decrease in size of the right kidney. 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 A few scattered additional right renal stones measuring up to 3 mm in the interpolar right kidney. 4. Large hiatal hernia. 5. Aortic Atherosclerosis (ICD10-I70.0). Coronary artery calcifications. Assessment for potential risk factor modification, dietary therapy or pharmacologic therapy may be warranted, if clinically indicated. Electronically Signed   By: Agustin Cree M.D.   On: 05/24/2022 15:53   CT HEAD WO CONTRAST ( )  Result Date: 05/24/2022 CLINICAL DATA:  Headache, sudden, severe. EXAM: CT HEAD WITHOUT CONTRAST TECHNIQUE: Contiguous axial images were obtained from the base  of the skull through the vertex without intravenous contrast. RADIATION DOSE REDUCTION: This exam was performed according to the departmental dose-optimization program which includes automated exposure control, adjustment of the mA and/or kV according to patient size and/or use of iterative reconstruction technique. COMPARISON:  None Available. FINDINGS: Brain: No acute  intracranial hemorrhage. 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 Cortical gray-white differentiation is otherwise preserved. No evidence of acute hydrocephalus. No extra-axial collection. No mass effect or midline shift. Vascular: No hyperdense vessel or unexpected calcification. Skull: No calvarial fracture or suspicious bone lesion. Skull base is unremarkable. Sinuses/Orbits: Unremarkable. Other: None. IMPRESSION: 1. No acute intracranial abnormality. 2. 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 Electronically Signed   By: Orvan Falconer M.D.   On: 05/24/2022 15:37   DG Chest Portable 1 View  Result Date: 05/24/2022 CLINICAL DATA:  Shortness of breath EXAM: PORTABLE CHEST 1 VIEW COMPARISON:  CXR 04/01/18 FINDINGS: No pleural effusion. No pneumothorax. Assessment of the lung apices is limited due to head positioning. Within this limitation there are patchy bilateral airspace opacities, right-greater-than-left. No radiographically apparent displaced rib fractures. Visualized upper abdomen is unremarkable. Thoracolumbar spinal fusion hardware in place. IMPRESSION: Patchy bilateral airspace opacities, right-greater-than-left, concerning for multifocal pneumonia. Electronically Signed   By: Lorenza Cambridge M.D.   On: 05/24/2022 14:24        Scheduled Meds:  baclofen  20 mg Oral TID   carbamazepine  200 mg Oral BID   dicyclomine  10 mg Oral TID AC & HS   docusate sodium  100 mg Oral BID   enoxaparin (LOVENOX) injection  30 mg Subcutaneous Q24H   hydrOXYzine  25 mg Oral TID   iron polysaccharides  150 mg Oral Daily   levothyroxine  75 mcg Oral Q0600   loratadine  10 mg Oral Daily   mirtazapine  15 mg Oral QHS   multivitamin with minerals  1 tablet Oral Q lunch   OLANZapine  10 mg Oral QHS   OLANZapine  5 mg Oral BID   pantoprazole  40 mg Oral Daily   predniSONE  40 mg Oral Q breakfast   sucralfate  1 g Oral TID WC & HS    tamsulosin  0.4 mg Oral QHS   Continuous Infusions:  sodium chloride     cefTRIAXone (ROCEPHIN)  IV       LOS: 1 day    Time spent: 35 mins     Charise Killian, MD [facility] Pager [phone]  If 7PM-7AM, please contact night-coverage [email or web address] 05/25/2022, 8:19 AM

## 2022-05-26 DIAGNOSIS — N390 Urinary tract infection, site not specified: Secondary | ICD-10-CM | POA: Diagnosis not present

## 2022-05-26 DIAGNOSIS — G809 Cerebral palsy, unspecified: Secondary | ICD-10-CM | POA: Diagnosis not present

## 2022-05-26 DIAGNOSIS — U071 COVID-19: Secondary | ICD-10-CM | POA: Diagnosis not present

## 2022-05-26 LAB — URINE CULTURE: Culture: 60000 — AB

## 2022-05-26 LAB — CBC
HCT: 41.8 % (ref 36.0–46.0)
Hemoglobin: 13.6 g/dL (ref 12.0–15.0)
MCH: 28.9 pg (ref 26.0–34.0)
MCHC: 32.5 g/dL (ref 30.0–36.0)
MCV: 88.9 fL (ref 80.0–100.0)
Platelets: 113 10*3/uL — ABNORMAL LOW (ref 150–400)
RBC: 4.7 MIL/uL (ref 3.87–5.11)
RDW: 13.7 % (ref 11.5–15.5)
WBC: 3.9 10*3/uL — ABNORMAL LOW (ref 4.0–10.5)
nRBC: 0 % (ref 0.0–0.2)

## 2022-05-26 LAB — BASIC METABOLIC PANEL
Anion gap: 10 (ref 5–15)
BUN: 24 mg/dL — ABNORMAL HIGH (ref 6–20)
CO2: 30 mmol/L (ref 22–32)
Calcium: 8.8 mg/dL — ABNORMAL LOW (ref 8.9–10.3)
Chloride: 100 mmol/L (ref 98–111)
Creatinine, Ser: 0.8 mg/dL (ref 0.44–1.00)
GFR, Estimated: >60 mL/min (ref >60)
Glucose, Bld: 88 mg/dL (ref 70–99)
Potassium: 3.9 mmol/L (ref 3.5–5.1)
Sodium: 140 mmol/L (ref 135–145)

## 2022-05-26 LAB — CULTURE, BLOOD (ROUTINE X 2): Culture: NO GROWTH

## 2022-05-26 MED ORDER — ENOXAPARIN SODIUM 40 MG/0.4ML IJ SOSY
40.0000 mg | PREFILLED_SYRINGE | INTRAMUSCULAR | Status: DC
  Administered 2022-05-26 – 2022-05-27 (×2): 40 mg via SUBCUTANEOUS
  Filled 2022-05-26 (×2): qty 0.4

## 2022-05-26 NOTE — Progress Notes (Signed)
PROGRESS NOTE    [name]  ZOX:[id number] DOB: [birth year] DOA: 05/24/2022 PCP: Lauro Regulus, MD    Assessment & Plan:   Principal Problem:   COVID-19 virus infection Active Problems:   UTI (urinary tract infection)   Nausea & vomiting   Seizure disorder (HCC)   Cerebral palsy (HCC)   Thrombocytopenia (HCC)   Hypothyroidism   Pleural effusion on left   Liver lesion  Assessment and Plan: COVID-19 virus pneumonia: CXR showed patchy bilateral opacity, right side is worse than the left. CTA negative for PE. Continue on steroids, bronchodilators. Procal <0.10. Continue w/ supportive care. Continue w/ airborne & contact precautions    UTI: UA is positive. Urine cx is growing klebsiella. Continue on IV ceftriaxone    Nausea & vomiting: etiology unclear. CT of abdomen/pelvis showed no bowel obstruction.  May be due to UTI and COVID infection. Zofran prn. Resolved    Seizure disorder: continue on home dose of tegretol. Ativan prn. Seizure precautions    Cerebral palsy and mental retardation: continue on home dose of olanzapine, hydroxyzine, baclofen. Ativan prn    Thrombocytopenia: possibly secondary to COVID19 infection. Trending up today    Hypothyroidism: continue on levothyroxine    Pleural effusion on left: thoracentesis ordered w/ fluid studies. S/p IV lasix x 1. No longer requiring supplemental oxygen    Liver lesion: incidental findings by CT scan. F/u outpatient w/ PCP        DVT prophylaxis: lovenox Code Status: DNR Family Communication: unable to reach legal guardian  Disposition Plan: likely d/c back to group home  Level of care: Telemetry Medical  Status is: Inpatient Remains inpatient appropriate because: severity of illness    Consultants:    Procedures:   Antimicrobials: rocephin    Subjective: Pt appears comfortable. Pt is nonverbal at baseline   Objective: Vitals:   05/25/22 1602 05/25/22 1730 05/25/22 2102 05/26/22 0529   BP: (!) 112/99  125/88 99/77  Pulse: 83  84 84  Resp: 16  18 16   Temp: (!) 97.3 F (36.3 C)  98.5 F (36.9 C) 97.9 F (36.6 C)  TempSrc:   Oral   SpO2: 93% 96% 94% 97%  Weight:      Height:        Intake/Output Summary (Last 24 hours) at 05/26/2022 0750 Last data filed at 05/25/2022 2100 Gross per 24 hour  Intake 100 ml  Output 1400 ml  Net -1300 ml   Filed Weights   05/25/22 0009  Weight: 48.3 kg    Examination:  General exam: Appears comfortable  Respiratory system: decreased breath sounds b/l  Cardiovascular system: S1/S2+. No rubs or gallops Gastrointestinal system: Abd is soft, NT, ND & normal bowel sounds  Central nervous system: alert and awake  Psychiatry: judgement and insight appears poor     Data Reviewed: I have personally reviewed following labs and imaging studies  CBC: Recent Labs  Lab 05/24/22 1251 05/25/22 0446  WBC 4.3 3.3*  HGB 13.1 11.6*  HCT 42.7 36.7  MCV 93.8 92.0  PLT 108* 104*   Basic Metabolic Panel: Recent Labs  Lab 05/24/22 1251 05/25/22 0446  NA 145 138  K 4.2 4.4  CL 108 104  CO2 28 25  GLUCOSE 108* 119*  BUN 23* 17  CREATININE 0.71 0.68  CALCIUM 8.8* 8.9   GFR: Estimated Creatinine Clearance: 49.5 mL/min (by C-G formula based on SCr of 0.68 mg/dL). Liver Function Tests: Recent Labs  Lab 05/24/22 1251  AST  40  ALT 38  ALKPHOS 212*  BILITOT 0.6  PROT 7.2  ALBUMIN 3.5   Recent Labs  Lab 05/24/22 1251  LIPASE 27   No results for input(s): "AMMONIA" in the last 168 hours. Coagulation Profile: No results for input(s): "INR", "PROTIME" in the last 168 hours. Cardiac Enzymes: No results for input(s): "CKTOTAL", "CKMB", "CKMBINDEX", "TROPONINI" in the last 168 hours. BNP (last 3 results) No results for input(s): "PROBNP" in the last 8760 hours. HbA1C: No results for input(s): "HGBA1C" in the last 72 hours. CBG: No results for input(s): "GLUCAP" in the last 168 hours. Lipid Profile: No results for  input(s): "CHOL", "HDL", "LDLCALC", "TRIG", "CHOLHDL", "LDLDIRECT" in the last 72 hours. Thyroid Function Tests: No results for input(s): "TSH", "T4TOTAL", "FREET4", "T3FREE", "THYROIDAB" in the last 72 hours. Anemia Panel: No results for input(s): "VITAMINB12", "FOLATE", "FERRITIN", "TIBC", "IRON", "RETICCTPCT" in the last 72 hours. Sepsis Labs: Recent Labs  Lab 05/24/22 1815 05/25/22 0015 05/25/22 0436  PROCALCITON  --   --  <0.10  LATICACIDVEN 1.3 1.5  --     Recent Results (from the past 240 hour(s))  SARS Coronavirus 2 by RT PCR (hospital order, performed in [facility] hospital lab) *cepheid single result test* Urine, Clean Catch     Status: Abnormal   Collection Time: 05/24/22 12:51 PM   Specimen: Urine, Clean Catch; Nasal Swab  Result Value Ref Range Status   SARS Coronavirus 2 by RT PCR POSITIVE (A) NEGATIVE Final    Comment: (NOTE) SARS-CoV-2 target nucleic acids are DETECTED  SARS-CoV-2 RNA is generally detectable in upper respiratory specimens  during the acute phase of infection.  Positive results are indicative  of the presence of the identified virus, but do not rule out bacterial infection or co-infection with other pathogens not detected by the test.  Clinical correlation with patient history and  other diagnostic information is necessary to determine patient infection status.  The expected result is negative.  Fact Sheet for Patients:   RoadLapTop.co.za   Fact Sheet for Healthcare Providers:   http://kim-miller.com/    This test is not yet approved or cleared by the [place] FDA and  has been authorized for detection and/or diagnosis of SARS-CoV-2 by FDA under an Emergency Use Authorization (EUA).  This EUA will remain in effect (meaning this test can be used) for the duration of  the COVID-19 declaration under Section 564(b)(1)  of the Act, 21 U.S.C. section 360-bbb-3(b)(1), unless the authorization  is terminated or revoked sooner.   Performed at [facility], [address], [place], [place] [address]   Urine Culture     Status: Abnormal (Preliminary result)   Collection Time: 05/24/22 12:51 PM   Specimen: Urine, Clean Catch  Result Value Ref Range Status   Specimen Description   Final    URINE, CLEAN CATCH Performed at [facility], [address], [place], [place] [address]    Special Requests   Final    NONE Performed at [facility], [address], [place], [place] [address]    Culture (A)  Final    60,000 COLONIES/mL GRAM NEGATIVE RODS IDENTIFICATION AND SUSCEPTIBILITIES TO FOLLOW Performed at [facility], [address], [place], [place] [address]    Report Status PENDING  Incomplete  Blood culture (routine x 2)     Status: None (Preliminary result)   Collection Time: 05/24/22  6:20 PM   Specimen: BLOOD LEFT HAND  Result Value Ref Range Status  Specimen Description BLOOD LEFT HAND  Final   Special Requests   Final    BOTTLES DRAWN AEROBIC ONLY Blood Culture adequate volume   Culture   Final    NO GROWTH 2 DAYS Performed at [facility], [address] [place], [place] [address]    Report Status PENDING  Incomplete  Blood culture (routine x 2)     Status: None (Preliminary result)   Collection Time: 05/25/22 12:15 AM   Specimen: BLOOD  Result Value Ref Range Status   Specimen Description BLOOD BLOOD RIGHT ARM  Final   Special Requests   Final    BOTTLES DRAWN AEROBIC AND ANAEROBIC Blood Culture adequate volume   Culture   Final    NO GROWTH 1 DAY Performed at [facility], [address], [place], [place] [address]    Report Status PENDING  Incomplete         Radiology Studies: CT Angio Chest PE W and/or Wo Contrast  Result Date: 05/24/2022 CLINICAL DATA:  Urinary tract infection and COVID positive with hypoxia, abdominal pain, and vomiting EXAM: CT ANGIOGRAPHY CHEST CT ABDOMEN AND PELVIS  WITH CONTRAST TECHNIQUE: Multidetector CT imaging of the chest was performed using the standard protocol during bolus administration of intravenous contrast. Multiplanar CT image reconstructions and MIPs were obtained to evaluate the vascular anatomy. Multidetector CT imaging of the abdomen and pelvis was performed using the standard protocol during bolus administration of intravenous contrast. RADIATION DOSE REDUCTION: This exam was performed according to the departmental dose-optimization program which includes automated exposure control, adjustment of the mA and/or kV according to patient size and/or use of iterative reconstruction technique. CONTRAST:  75mL OMNIPAQUE IOHEXOL 350 MG/ML SOLN COMPARISON:  CT abdomen and pelvis dated 03/04/2021 FINDINGS: CTA CHEST FINDINGS Cardiovascular: The study is high quality for the evaluation of pulmonary embolism. There are no filling defects in the central, lobar, segmental or subsegmental pulmonary artery branches to suggest acute pulmonary embolism. Great vessels are normal in course and caliber. Normal heart size. No significant pericardial fluid/thickening. Coronary artery calcifications. Mediastinum/Nodes: Imaged thyroid gland without nodules meeting criteria for imaging follow-up by size. Large hiatal hernia. No pathologically enlarged axillary, supraclavicular, mediastinal, or hilar lymph nodes. Lungs/Pleura: The central airways are patent. Mild diffuse bronchial wall thickening. Left lower lobe relaxation atelectasis. No pneumothorax. Moderate left pleural effusion. Musculoskeletal: No acute or abnormal lytic or blastic osseous lesions. Review of the MIP images confirms the above findings. CT ABDOMEN and PELVIS FINDINGS Hepatobiliary: Subtle 2.8 x 2.5 cm hypoenhancing lesion along the medial inferior right hepatic lobe (4:30) not well seen on prior noncontrast-enhanced examination. 1.3 x 1.1 cm arterially enhancing focus within peripheral segment 5 (4:19) possibly  flash filling hemangioma or perfusional variation no intra or extrahepatic biliary ductal dilation. Normal gallbladder. Pancreas: No focal lesions or main ductal dilation. Spleen: Normal in size without focal abnormality. Adrenals/Urinary Tract: No adrenal nodules. Marked interval decrease in size of the right kidney. 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 A few scattered additional right renal stones measuring up to 3 mm in the interpolar right kidney. No suspicious renal mass or hydronephrosis. No focal bladder wall thickening. Stomach/Bowel: Normal appearance of the stomach. No evidence of bowel wall thickening, distention, or inflammatory changes. Appendix is not discretely seen. Vascular/Lymphatic: Aortic atherosclerosis. No enlarged abdominal or pelvic lymph nodes. Reproductive: No adnexal masses. Other: Small volume right sided free fluid. No free air or fluid collection. Musculoskeletal: No acute or abnormal lytic or blastic  osseous lesions. Postsurgical changes from thoracolumbar spinal fusion. Unchanged fracture of the right fusion rod. IMPRESSION: CTA CHEST: 1. No evidence of pulmonary embolism. 2. Moderate left pleural effusion with associated relaxation atelectasis. CT ABDOMEN/PELVIS: 1. Subtle 2.8 x 2.5 cm hypoenhancing lesion along the medial inferior right hepatic lobe, not well seen on prior noncontrast-enhanced examination. Recommend further evaluation with nonemergent contrast-enhanced MRI. 2. Small volume right sided free fluid. 3. Marked interval decrease in size of the right kidney. 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 A few scattered additional right renal stones measuring up to 3 mm in the interpolar right kidney. 4. Large hiatal hernia. 5. Aortic Atherosclerosis (ICD10-I70.0). Coronary artery calcifications. Assessment for potential risk factor modification, dietary therapy or pharmacologic therapy may be warranted, if clinically indicated. Electronically  Signed   By: Agustin Cree M.D.   On: 05/24/2022 15:53   CT ABDOMEN PELVIS W CONTRAST  Result Date: 05/24/2022 CLINICAL DATA:  Urinary tract infection and COVID positive with hypoxia, abdominal pain, and vomiting EXAM: CT ANGIOGRAPHY CHEST CT ABDOMEN AND PELVIS WITH CONTRAST TECHNIQUE: Multidetector CT imaging of the chest was performed using the standard protocol during bolus administration of intravenous contrast. Multiplanar CT image reconstructions and MIPs were obtained to evaluate the vascular anatomy. Multidetector CT imaging of the abdomen and pelvis was performed using the standard protocol during bolus administration of intravenous contrast. RADIATION DOSE REDUCTION: This exam was performed according to the departmental dose-optimization program which includes automated exposure control, adjustment of the mA and/or kV according to patient size and/or use of iterative reconstruction technique. CONTRAST:  75mL OMNIPAQUE IOHEXOL 350 MG/ML SOLN COMPARISON:  CT abdomen and pelvis dated 03/04/2021 FINDINGS: CTA CHEST FINDINGS Cardiovascular: The study is high quality for the evaluation of pulmonary embolism. There are no filling defects in the central, lobar, segmental or subsegmental pulmonary artery branches to suggest acute pulmonary embolism. Great vessels are normal in course and caliber. Normal heart size. No significant pericardial fluid/thickening. Coronary artery calcifications. Mediastinum/Nodes: Imaged thyroid gland without nodules meeting criteria for imaging follow-up by size. Large hiatal hernia. No pathologically enlarged axillary, supraclavicular, mediastinal, or hilar lymph nodes. Lungs/Pleura: The central airways are patent. Mild diffuse bronchial wall thickening. Left lower lobe relaxation atelectasis. No pneumothorax. Moderate left pleural effusion. Musculoskeletal: No acute or abnormal lytic or blastic osseous lesions. Review of the MIP images confirms the above findings. CT ABDOMEN and  PELVIS FINDINGS Hepatobiliary: Subtle 2.8 x 2.5 cm hypoenhancing lesion along the medial inferior right hepatic lobe (4:30) not well seen on prior noncontrast-enhanced examination. 1.3 x 1.1 cm arterially enhancing focus within peripheral segment 5 (4:19) possibly flash filling hemangioma or perfusional variation no intra or extrahepatic biliary ductal dilation. Normal gallbladder. Pancreas: No focal lesions or main ductal dilation. Spleen: Normal in size without focal abnormality. Adrenals/Urinary Tract: No adrenal nodules. Marked interval decrease in size of the right kidney. 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 A few scattered additional right renal stones measuring up to 3 mm in the interpolar right kidney. No suspicious renal mass or hydronephrosis. No focal bladder wall thickening. Stomach/Bowel: Normal appearance of the stomach. No evidence of bowel wall thickening, distention, or inflammatory changes. Appendix is not discretely seen. Vascular/Lymphatic: Aortic atherosclerosis. No enlarged abdominal or pelvic lymph nodes. Reproductive: No adnexal masses. Other: Small volume right sided free fluid. No free air or fluid collection. Musculoskeletal: No acute or abnormal lytic or blastic osseous lesions. Postsurgical changes from thoracolumbar spinal fusion. Unchanged fracture of the right fusion  rod. IMPRESSION: CTA CHEST: 1. No evidence of pulmonary embolism. 2. Moderate left pleural effusion with associated relaxation atelectasis. CT ABDOMEN/PELVIS: 1. Subtle 2.8 x 2.5 cm hypoenhancing lesion along the medial inferior right hepatic lobe, not well seen on prior noncontrast-enhanced examination. Recommend further evaluation with nonemergent contrast-enhanced MRI. 2. Small volume right sided free fluid. 3. Marked interval decrease in size of the right kidney. 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 A few scattered additional right renal stones measuring up to 3 mm in the interpolar  right kidney. 4. Large hiatal hernia. 5. Aortic Atherosclerosis (ICD10-I70.0). Coronary artery calcifications. Assessment for potential risk factor modification, dietary therapy or pharmacologic therapy may be warranted, if clinically indicated. Electronically Signed   By: Agustin Cree M.D.   On: 05/24/2022 15:53   CT HEAD WO CONTRAST ( )  Result Date: 05/24/2022 CLINICAL DATA:  Headache, sudden, severe. EXAM: CT HEAD WITHOUT CONTRAST TECHNIQUE: Contiguous axial images were obtained from the base of the skull through the vertex without intravenous contrast. RADIATION DOSE REDUCTION: This exam was performed according to the departmental dose-optimization program which includes automated exposure control, adjustment of the mA and/or kV according to patient size and/or use of iterative reconstruction technique. COMPARISON:  None Available. FINDINGS: Brain: No acute intracranial hemorrhage. 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 Cortical gray-white differentiation is otherwise preserved. No evidence of acute hydrocephalus. No extra-axial collection. No mass effect or midline shift. Vascular: No hyperdense vessel or unexpected calcification. Skull: No calvarial fracture or suspicious bone lesion. Skull base is unremarkable. Sinuses/Orbits: Unremarkable. Other: None. IMPRESSION: 1. No acute intracranial abnormality. 2. 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 Electronically Signed   By: Orvan Falconer M.D.   On: 05/24/2022 15:37   DG Chest Portable 1 View  Result Date: 05/24/2022 CLINICAL DATA:  Shortness of breath EXAM: PORTABLE CHEST 1 VIEW COMPARISON:  CXR 04/01/18 FINDINGS: No pleural effusion. No pneumothorax. Assessment of the lung apices is limited due to head positioning. Within this limitation there are patchy bilateral airspace opacities, right-greater-than-left. No radiographically apparent displaced rib fractures. Visualized upper  abdomen is unremarkable. Thoracolumbar spinal fusion hardware in place. IMPRESSION: Patchy bilateral airspace opacities, right-greater-than-left, concerning for multifocal pneumonia. Electronically Signed   By: Lorenza Cambridge M.D.   On: 05/24/2022 14:24        Scheduled Meds:  baclofen  20 mg Oral TID   carbamazepine  200 mg Oral BID   dicyclomine  10 mg Oral TID AC & HS   docusate sodium  100 mg Oral BID   enoxaparin (LOVENOX) injection  30 mg Subcutaneous Q24H   hydrOXYzine  25 mg Oral TID   iron polysaccharides  150 mg Oral Daily   levothyroxine  75 mcg Oral Q0600   loratadine  10 mg Oral Daily   mirtazapine  15 mg Oral QHS   multivitamin with minerals  1 tablet Oral Q lunch   OLANZapine  10 mg Oral QHS   OLANZapine  5 mg Oral BID   pantoprazole  40 mg Oral Daily   predniSONE  40 mg Oral Q breakfast   sucralfate  1 g Oral TID WC & HS   tamsulosin  0.4 mg Oral QHS   Continuous Infusions:  sodium chloride     cefTRIAXone (ROCEPHIN)  IV 2 g (05/25/22 1333)     LOS: 2 days    Time spent: 30 mins     Charise Killian, MD [facility] Pager [phone]  If 7PM-7AM, please  contact night-coverage [email or web address] 05/26/2022, 7:50 AM

## 2022-05-27 DIAGNOSIS — U071 COVID-19: Secondary | ICD-10-CM | POA: Diagnosis not present

## 2022-05-27 DIAGNOSIS — N390 Urinary tract infection, site not specified: Secondary | ICD-10-CM | POA: Diagnosis not present

## 2022-05-27 DIAGNOSIS — G809 Cerebral palsy, unspecified: Secondary | ICD-10-CM | POA: Diagnosis not present

## 2022-05-27 LAB — CBC
HCT: 40.4 % (ref 36.0–46.0)
Hemoglobin: 12.5 g/dL (ref 12.0–15.0)
MCH: 28.6 pg (ref 26.0–34.0)
MCHC: 30.9 g/dL (ref 30.0–36.0)
MCV: 92.4 fL (ref 80.0–100.0)
Platelets: 114 10*3/uL — ABNORMAL LOW (ref 150–400)
RBC: 4.37 MIL/uL (ref 3.87–5.11)
RDW: 14 % (ref 11.5–15.5)
WBC: 3.7 10*3/uL — ABNORMAL LOW (ref 4.0–10.5)
nRBC: 0 % (ref 0.0–0.2)

## 2022-05-27 LAB — CULTURE, BLOOD (ROUTINE X 2): Special Requests: ADEQUATE

## 2022-05-27 LAB — BASIC METABOLIC PANEL WITH GFR
Anion gap: 12 (ref 5–15)
BUN: 30 mg/dL — ABNORMAL HIGH (ref 6–20)
CO2: 23 mmol/L (ref 22–32)
Calcium: 8.4 mg/dL — ABNORMAL LOW (ref 8.9–10.3)
Chloride: 106 mmol/L (ref 98–111)
Creatinine, Ser: 0.78 mg/dL (ref 0.44–1.00)
GFR, Estimated: >60 mL/min
Glucose, Bld: 79 mg/dL (ref 70–99)
Potassium: 3.9 mmol/L (ref 3.5–5.1)
Sodium: 141 mmol/L (ref 135–145)

## 2022-05-27 MED ORDER — CIPROFLOXACIN HCL 500 MG PO TABS
500.0000 mg | ORAL_TABLET | Freq: Two times a day (BID) | ORAL | Status: DC
  Administered 2022-05-27 – 2022-05-28 (×3): 500 mg via ORAL
  Filled 2022-05-27 (×3): qty 1

## 2022-05-27 MED ORDER — CARBAMAZEPINE 100 MG/5ML PO SUSP
100.0000 mg | Freq: Four times a day (QID) | ORAL | Status: DC
  Administered 2022-05-27 – 2022-05-28 (×5): 100 mg via ORAL
  Filled 2022-05-27 (×7): qty 5

## 2022-05-27 NOTE — TOC Initial Note (Addendum)
Transition of Care [facility]) - Initial/Assessment Note    Patient Details  Name: [name] MRN: [id number] Date of Birth: 07/03/1965  [facility] CM/SW Contact:    [name], RN Phone Number: 05/27/2022, 3:50 PM  Clinical Narrative:                    Patient from [facility].   Per MD patient medically ready for discharge  -  Call placed to [name] x3.  VM left x2 and text message sent - Call placed to caregiver [name].  VM box full - Call placed to [facility] who runs the building that patient resides.  VM left   Mother [name] confirms plan is to return to [facility] at discharge  Patient Goals and CMS Choice            Expected Discharge Plan and Services                                              Prior Living Arrangements/Services                       Activities of Daily Living      Permission Sought/Granted                  Emotional Assessment              Admission diagnosis:  Urinary tract infection without hematuria, site unspecified [N39.0] COVID-19 virus infection [U07.1] COVID-19 [U07.1] Patient Active Problem List   Diagnosis Date Noted   COVID-19 virus infection 05/24/2022   Cerebral palsy (HCC) 05/24/2022   Seizure disorder (HCC) 05/24/2022   UTI (urinary tract infection) 05/24/2022   Thrombocytopenia (HCC) 05/24/2022   Nausea & vomiting 05/24/2022   Hypothyroidism 05/24/2022   Pleural effusion on left 05/24/2022   Liver lesion 05/24/2022   Mental retardation 02/21/2015   Seizure disorder, primary (HCC) 02/21/2015   Menopause 02/21/2015   Abnormal liver function tests 02/21/2015   PCP:  Lauro Regulus, MD Pharmacy:   [facility] - [place], [place] - [address] [place] [address] Phone: [phone] Fax: [phone]     Social Determinants of Health (SDOH) Social History: SDOH Screenings   Tobacco Use: Low Risk   (05/24/2022)   SDOH Interventions:     Readmission Risk Interventions     No data to display

## 2022-05-27 NOTE — Progress Notes (Signed)
PROGRESS NOTE    [name]  ZOX:[id number] DOB: 1965-10-25 DOA: 05/24/2022 PCP: Lauro Regulus, MD    Assessment & Plan:   Principal Problem:   COVID-19 virus infection Active Problems:   UTI (urinary tract infection)   Nausea & vomiting   Seizure disorder (HCC)   Cerebral palsy (HCC)   Thrombocytopenia (HCC)   Hypothyroidism   Pleural effusion on left   Liver lesion  Assessment and Plan: COVID-19 virus pneumonia: CXR showed patchy bilateral opacity, right side is worse than the left. CTA negative for PE. Continue on steroids, bronchodilators. Procal <0.10. Continue w/ supportive care. Continue w/ airborne & contact precautions    UTI: UA is positive. Urine cx is growing klebsiella. Will d/c IV rocephin and po cipro    Nausea & vomiting: etiology unclear. CT of abdomen/pelvis showed no bowel obstruction.  May be due to UTI and COVID infection. Zofran prn. Resolved    Seizure disorder: continue on home dose of carbamazepine. Ativan prn. Seizure precautions    Cerebral palsy and mental retardation: continue on home dose of baclofen, hyroxyzine, olanzapine. Ativan prn    Thrombocytopenia: possibly secondary to COVID19 infection. Labile    Hypothyroidism: continue on levothyroxine    Pleural effusion on left: thoracentesis d/c. S/p IV lasix x 1. No longer requiring supplemental oxygen. Clinically much improved    Liver lesion: incidental findings by CT scan. F/u outpatient w/ PCP        DVT prophylaxis: lovenox Code Status: DNR Family Communication: unable to reach legal guardian  Disposition Plan: likely d/c back to group home  Level of care: Telemetry Medical  Status is: Inpatient Remains inpatient appropriate because: medically stable for d/c but waiting to hear back from pt's group home to accept the pt back    Consultants:    Procedures:   Antimicrobials: cipro    Subjective: Pt appears comfortable. Pt is nonverbal at baseline    Objective: Vitals:   05/26/22 0815 05/26/22 1600 05/26/22 2103 05/27/22 0500  BP: 120/80 112/77 124/82 105/77  Pulse: 78 80 72 77  Resp: 16 16 16    Temp: 97.8 F (36.6 C) 98.5 F (36.9 C) 98.2 F (36.8 C) 97.8 F (36.6 C)  TempSrc: Axillary Axillary Axillary Axillary  SpO2: 98% 94% 95% 96%  Weight:      Height:        Intake/Output Summary (Last 24 hours) at 05/27/2022 0758 Last data filed at 05/26/2022 2154 Gross per 24 hour  Intake 100.06 ml  Output 0 ml  Net 100.06 ml   Filed Weights   05/25/22 0009  Weight: 48.3 kg    Examination:  General exam: Appears calm & comfortable  Respiratory system: decreased breath sounds b/l otherwise clear  Cardiovascular system: S1 & S2+. No rubs or clicks  Gastrointestinal system: Abd is soft, NT, ND & normal bowel sounds   Central nervous system: Alert and awake  Psychiatry: judgement and insight appears poor     Data Reviewed: I have personally reviewed following labs and imaging studies  CBC: Recent Labs  Lab 05/24/22 1251 05/25/22 0446 05/26/22 0853  WBC 4.3 3.3* 3.9*  HGB 13.1 11.6* 13.6  HCT 42.7 36.7 41.8  MCV 93.8 92.0 88.9  PLT 108* 104* 113*   Basic Metabolic Panel: Recent Labs  Lab 05/24/22 1251 05/25/22 0446 05/26/22 0853  NA 145 138 140  K 4.2 4.4 3.9  CL 108 104 100  CO2 28 25 30   GLUCOSE 108* 119* 88  BUN 23* 17 24*  CREATININE 0.71 0.68 0.80  CALCIUM 8.8* 8.9 8.8*   GFR: Estimated Creatinine Clearance: 49.5 mL/min (by C-G formula based on SCr of 0.8 mg/dL). Liver Function Tests: Recent Labs  Lab 05/24/22 1251  AST 40  ALT 38  ALKPHOS 212*  BILITOT 0.6  PROT 7.2  ALBUMIN 3.5   Recent Labs  Lab 05/24/22 1251  LIPASE 27   No results for input(s): "AMMONIA" in the last 168 hours. Coagulation Profile: No results for input(s): "INR", "PROTIME" in the last 168 hours. Cardiac Enzymes: No results for input(s): "CKTOTAL", "CKMB", "CKMBINDEX", "TROPONINI" in the last 168 hours. BNP  (last 3 results) No results for input(s): "PROBNP" in the last 8760 hours. HbA1C: No results for input(s): "HGBA1C" in the last 72 hours. CBG: No results for input(s): "GLUCAP" in the last 168 hours. Lipid Profile: No results for input(s): "CHOL", "HDL", "LDLCALC", "TRIG", "CHOLHDL", "LDLDIRECT" in the last 72 hours. Thyroid Function Tests: No results for input(s): "TSH", "T4TOTAL", "FREET4", "T3FREE", "THYROIDAB" in the last 72 hours. Anemia Panel: No results for input(s): "VITAMINB12", "FOLATE", "FERRITIN", "TIBC", "IRON", "RETICCTPCT" in the last 72 hours. Sepsis Labs: Recent Labs  Lab 05/24/22 1815 05/25/22 0015 05/25/22 0436  PROCALCITON  --   --  <0.10  LATICACIDVEN 1.3 1.5  --     Recent Results (from the past 240 hour(s))  SARS Coronavirus 2 by RT PCR (hospital order, performed in [facility] hospital lab) *cepheid single result test* Urine, Clean Catch     Status: Abnormal   Collection Time: 05/24/22 12:51 PM   Specimen: Urine, Clean Catch; Nasal Swab  Result Value Ref Range Status   SARS Coronavirus 2 by RT PCR POSITIVE (A) NEGATIVE Final    Comment: (NOTE) SARS-CoV-2 target nucleic acids are DETECTED  SARS-CoV-2 RNA is generally detectable in upper respiratory specimens  during the acute phase of infection.  Positive results are indicative  of the presence of the identified virus, but do not rule out bacterial infection or co-infection with other pathogens not detected by the test.  Clinical correlation with patient history and  other diagnostic information is necessary to determine patient infection status.  The expected result is negative.  Fact Sheet for Patients:   RoadLapTop.co.za   Fact Sheet for Healthcare Providers:   http://kim-miller.com/    This test is not yet approved or cleared by the [place] FDA and  has been authorized for detection and/or diagnosis of SARS-CoV-2 by FDA under an Emergency Use  Authorization (EUA).  This EUA will remain in effect (meaning this test can be used) for the duration of  the COVID-19 declaration under Section 564(b)(1)  of the Act, 21 U.S.C. section 360-bbb-3(b)(1), unless the authorization is terminated or revoked sooner.   Performed at [facility], [address], [place], [place] [address]   Urine Culture     Status: Abnormal   Collection Time: 05/24/22 12:51 PM   Specimen: Urine, Clean Catch  Result Value Ref Range Status   Specimen Description   Final    URINE, CLEAN CATCH Performed at [facility], [address] [place], [place] [address]    Special Requests   Final    NONE Performed at [facility], [address] [place], [place] [address]    Culture 60,000 COLONIES/mL KLEBSIELLA PNEUMONIAE (A)  Final   Report Status 05/26/2022 FINAL  Final   Organism ID, Bacteria KLEBSIELLA PNEUMONIAE (A)  Final      Susceptibility  Klebsiella pneumoniae - MIC*    AMPICILLIN >=32 RESISTANT Resistant     CEFAZOLIN <=4 SENSITIVE Sensitive     CEFEPIME <=0.12 SENSITIVE Sensitive     CEFTRIAXONE <=0.25 SENSITIVE Sensitive     CIPROFLOXACIN <=0.25 SENSITIVE Sensitive     GENTAMICIN <=1 SENSITIVE Sensitive     IMIPENEM <=0.25 SENSITIVE Sensitive     NITROFURANTOIN 128 RESISTANT Resistant     TRIMETH/SULFA <=20 SENSITIVE Sensitive     AMPICILLIN/SULBACTAM 4 SENSITIVE Sensitive     PIP/TAZO <=4 SENSITIVE Sensitive     * 60,000 COLONIES/mL KLEBSIELLA PNEUMONIAE  Blood culture (routine x 2)     Status: None (Preliminary result)   Collection Time: 05/24/22  6:20 PM   Specimen: BLOOD LEFT HAND  Result Value Ref Range Status   Specimen Description BLOOD LEFT HAND  Final   Special Requests   Final    BOTTLES DRAWN AEROBIC ONLY Blood Culture adequate volume   Culture   Final    NO GROWTH 3 DAYS Performed at [facility], [address] [place], [place] [address]    Report Status PENDING  Incomplete  Blood  culture (routine x 2)     Status: None (Preliminary result)   Collection Time: 05/25/22 12:15 AM   Specimen: BLOOD  Result Value Ref Range Status   Specimen Description BLOOD BLOOD RIGHT ARM  Final   Special Requests   Final    BOTTLES DRAWN AEROBIC AND ANAEROBIC Blood Culture adequate volume   Culture   Final    NO GROWTH 2 DAYS Performed at [facility], [address] [place], [place] [address]    Report Status PENDING  Incomplete         Radiology Studies: No results found.      Scheduled Meds:  baclofen  20 mg Oral TID   carbamazepine  200 mg Oral BID   dicyclomine  10 mg Oral TID AC & HS   docusate sodium  100 mg Oral BID   enoxaparin (LOVENOX) injection  40 mg Subcutaneous Q24H   hydrOXYzine  25 mg Oral TID   iron polysaccharides  150 mg Oral Daily   levothyroxine  75 mcg Oral Q0600   loratadine  10 mg Oral Daily   mirtazapine  15 mg Oral QHS   multivitamin with minerals  1 tablet Oral Q lunch   OLANZapine  10 mg Oral QHS   OLANZapine  5 mg Oral BID   pantoprazole  40 mg Oral Daily   predniSONE  40 mg Oral Q breakfast   sucralfate  1 g Oral TID WC & HS   tamsulosin  0.4 mg Oral QHS   Continuous Infusions:  sodium chloride     cefTRIAXone (ROCEPHIN)  IV Stopped (05/26/22 1350)     LOS: 3 days    Time spent: 25 mins     Charise Killian, MD [facility] Pager [phone]  If 7PM-7AM, please contact night-coverage [email or web address] 05/27/2022, 7:58 AM

## 2022-05-28 DIAGNOSIS — U071 COVID-19: Secondary | ICD-10-CM | POA: Diagnosis not present

## 2022-05-28 DIAGNOSIS — N390 Urinary tract infection, site not specified: Secondary | ICD-10-CM | POA: Diagnosis not present

## 2022-05-28 DIAGNOSIS — G809 Cerebral palsy, unspecified: Secondary | ICD-10-CM | POA: Diagnosis not present

## 2022-05-28 LAB — BASIC METABOLIC PANEL
Anion gap: 7 (ref 5–15)
BUN: 26 mg/dL — ABNORMAL HIGH (ref 6–20)
CO2: 30 mmol/L (ref 22–32)
Calcium: 8.7 mg/dL — ABNORMAL LOW (ref 8.9–10.3)
Chloride: 105 mmol/L (ref 98–111)
Creatinine, Ser: 0.78 mg/dL (ref 0.44–1.00)
GFR, Estimated: >60 mL/min (ref >60)
Glucose, Bld: 89 mg/dL (ref 70–99)
Potassium: 3.5 mmol/L (ref 3.5–5.1)
Sodium: 142 mmol/L (ref 135–145)

## 2022-05-28 LAB — CBC
HCT: 38.3 % (ref 36.0–46.0)
Hemoglobin: 12.2 g/dL (ref 12.0–15.0)
MCH: 28.6 pg (ref 26.0–34.0)
MCHC: 31.9 g/dL (ref 30.0–36.0)
MCV: 89.9 fL (ref 80.0–100.0)
Platelets: 120 10*3/uL — ABNORMAL LOW (ref 150–400)
RBC: 4.26 MIL/uL (ref 3.87–5.11)
RDW: 13.8 % (ref 11.5–15.5)
WBC: 2.8 10*3/uL — ABNORMAL LOW (ref 4.0–10.5)
nRBC: 0 % (ref 0.0–0.2)

## 2022-05-28 MED ORDER — PREDNISONE 20 MG PO TABS: 40.0000 mg | ORAL_TABLET | Freq: Every day | ORAL | 0 refills | Status: DC

## 2022-05-28 MED ORDER — PREDNISONE 20 MG PO TABS: 40.0000 mg | ORAL_TABLET | Freq: Every day | ORAL | 0 refills | Status: AC

## 2022-05-28 MED ORDER — CIPROFLOXACIN HCL 500 MG PO TABS: 500.0000 mg | ORAL_TABLET | Freq: Two times a day (BID) | ORAL | 0 refills | Status: AC

## 2022-05-28 MED ORDER — CIPROFLOXACIN HCL 500 MG PO TABS: 500.0000 mg | ORAL_TABLET | Freq: Two times a day (BID) | ORAL | 0 refills | Status: DC

## 2022-05-28 NOTE — Discharge Summary (Signed)
Physician Discharge Summary  [name] ZOX:[id number] DOB: Jun 15, 1965 DOA: 05/24/2022  PCP: Lauro Regulus, MD  Admit date: 05/24/2022 Discharge date: 05/28/2022  Admitted From: Group home Disposition:  Group home   Recommendations for Outpatient Follow-up:  Follow up with PCP in 1-2 weeks   Home Health: no  Equipment/Devices:  Discharge Condition: stable  CODE STATUS: DNR  Diet recommendation: Dysphagia II diet   Brief/Interim Summary: HPI was taken from Dr. Clyde Lundborg: [name] is a 57 y.o. female with medical history significant of cerebral palsy, nonverbel, mental retardation, seizure, scoliosis, myopia, hypothyroidism, kidney stone, who presents with nausea vomiting.   Patient has hx of cerebral palsy and mental retardation, nonverbal at baseline, cannot provide any medical history, therefore, most of the history is obtained by discussing the case with ED physician, per EMS report, and with the nursing staff.  I also called patient's mother who provided limited history.   Per report, patient has dry cough, nausea and multiple episodes of nonbilious nonbloody vomiting for 2 days.  Patient has poor appetite, decreased oral intake in the past several days.  Patient is currently being treated for UTI with macrobid. They were unable to get a urine sample and so they treated her prophylactically.  No active respiratory distress, diarrhea noted.  Not sure if patient has symptoms of UTI.  Patient has agitation.  Per mother, patient would bite people.   Data reviewed independently and ED Course: pt was found to have positive COVID PCR, WBC 4.3, GFR> 60, platelet 105, temperature normal, blood pressure 141/88, heart rate 106, RR 20, oxygen saturation 91-99% on room air.  Chest x-ray showed patchy bilateral opacity, right side is worse than the left.  CT head is negative for acute intracranial abnormalities.  Patient is admitted to telemetry bed as inpatient.   CTA: 1. No evidence of  pulmonary embolism. 2. Moderate left pleural effusion with associated relaxation atelectasis.   CT ABDOMEN/PELVIS:  1. Subtle 2.8 x 2.5 cm hypoenhancing lesion along the medial inferior right hepatic lobe, not well seen on prior noncontrast-enhanced examination. Recommend further evaluation with nonemergent contrast-enhanced MRI. 2. Small volume right sided free fluid. 3. Marked interval decrease in size of the right kidney. 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 A few scattered additional right renal stones measuring up to 3 mm in the interpolar right kidney. 4. Large hiatal hernia. 5. Aortic Atherosclerosis (ICD10-I70.0). Coronary artery calcifications. Assessment for potential risk factor modification, dietary therapy or pharmacologic therapy may be warranted, if clinically indicated.    As per Dr. Mayford Knife 5/4-05/28/22: Pt was found to have COVID19 pneumonia on admission. Pt was treated w/ steroids, bronchodilators. Of note, pt was found to have UTI secondary to klebsiella. Pt was initially treated w/ IV rocephin and then abxs were changed to po cipro.   Discharge Diagnoses:  Principal Problem:   COVID-19 virus infection Active Problems:   UTI (urinary tract infection)   Nausea & vomiting   Seizure disorder (HCC)   Cerebral palsy (HCC)   Thrombocytopenia (HCC)   Hypothyroidism   Pleural effusion on left   Liver lesion  COVID-19 virus pneumonia: CXR showed patchy bilateral opacity, right side is worse than the left. CTA negative for PE. Continue on steroids x 5 days at d/c. Continue on bronchodilators. Procal <0.10. Continue w/ supportive care. Continue w/ airborne & contact precautions    UTI: UA is positive. Urine cx is growing klebsiella. D/c IV rocephin. Continue on po cipro x 6  days at d/c   Nausea & vomiting: etiology unclear. CT of abdomen/pelvis showed no bowel obstruction.  May be due to UTI and COVID infection. Zofran prn. Resolved    Seizure  disorder: continue on home dose of carbamazepine. Ativan prn. Seizure precautions    Cerebral palsy and mental retardation: continue on home dose of baclofen, hyroxyzine, olanzapine. Ativan prn    Thrombocytopenia: possibly secondary to COVID19 infection. Labile    Hypothyroidism: continue on levothyroxine    Pleural effusion on left: thoracentesis d/c. S/p IV lasix x 1. No longer requiring supplemental oxygen. Clinically much improved    Liver lesion: incidental findings by CT scan. F/u outpatient w/ PCP  Discharge Instructions  Discharge Instructions     Diet general   Complete by: As directed    Dysphagia II diet   Discharge instructions   Complete by: As directed    F/u w/ PCP in 1-2 weeks   Increase activity slowly   Complete by: As directed       Allergies as of 05/28/2022       Reactions   Amantadines Nausea And Vomiting        Medication List     STOP taking these medications    cholecalciferol 1000 units tablet Commonly known as: VITAMIN D   nitrofurantoin (macrocrystal-monohydrate) 100 MG capsule Commonly known as: MACROBID   Poly Fe Cmplx-FeHemPoly-FA-B12 22-6-1-0.025 MG Tabs   POLYSACCH FE CMP-FE HEME POLY PO   potassium chloride 10 MEQ tablet Commonly known as: KLOR-CON M       TAKE these medications    aspirin EC 325 MG tablet Take 325 mg by mouth daily.   azelastine 0.05 % ophthalmic solution Commonly known as: OPTIVAR Place 1 drop into both eyes 2 (two) times daily.   baclofen 20 MG tablet Commonly known as: LIORESAL Take 20 mg by mouth 3 (three) times daily.   carbamazepine 200 MG 12 hr tablet Commonly known as: TEGRETOL XR Take 200 mg by mouth 2 (two) times daily.   cetirizine 10 MG tablet Commonly known as: ZYRTEC Take 10 mg by mouth daily as needed for allergies.   ciprofloxacin 500 MG tablet Commonly known as: CIPRO Take 1 tablet (500 mg total) by mouth 2 (two) times daily for 6 days.   dicyclomine 10 MG  capsule Commonly known as: BENTYL Take 10 mg by mouth 4 (four) times daily -  before meals and at bedtime.   DSS 100 MG Caps Take 100 mg by mouth 2 (two) times daily.   eucerin cream Apply topically as needed for dry skin.   furosemide 20 MG tablet Commonly known as: LASIX Take 20 mg by mouth as needed.   hydrOXYzine 25 MG tablet Commonly known as: ATARAX Take 25 mg by mouth 3 (three) times daily.   iron polysaccharides 150 MG capsule Commonly known as: NIFEREX Take 1 capsule by mouth daily.   levothyroxine 75 MCG tablet Commonly known as: SYNTHROID Take 75 mcg by mouth daily before breakfast.   LORazepam 1 MG tablet Commonly known as: ATIVAN Take 1 mg by mouth as needed.   metoCLOPramide 5 MG tablet Commonly known as: REGLAN Take 5 mg by mouth 4 (four) times daily as needed for nausea or vomiting.   mirtazapine 15 MG tablet Commonly known as: REMERON Take 15 mg by mouth at bedtime.   multivitamin capsule Take 1 capsule by mouth daily.   OLANZapine 5 MG tablet Commonly known as: ZYPREXA Take 5-10 mg by mouth 3 (  three) times daily.   omeprazole 20 MG capsule Commonly known as: PRILOSEC Take 20 mg by mouth 2 (two) times daily.   predniSONE 20 MG tablet Commonly known as: DELTASONE Take 2 tablets (40 mg total) by mouth daily with breakfast for 5 days.   sucralfate 1 GM/10ML suspension Commonly known as: CARAFATE Take 1 g by mouth 4 (four) times daily -  with meals and at bedtime.   Sulfacetamide Sodium (Acne) 10 % Lotn Apply topically 2 (two) times daily.   tamsulosin 0.4 MG Caps capsule Commonly known as: FLOMAX Take 0.4 mg by mouth at bedtime.   UNABLE TO FIND Med Name: CBD Oil 500mg /81mL.  1 dropper under tongue BID.        Allergies  Allergen Reactions   Amantadines Nausea And Vomiting    Consultations:    Procedures/Studies: CT Angio Chest PE W and/or Wo Contrast  Result Date: 05/24/2022 CLINICAL DATA:  Urinary tract infection and  COVID positive with hypoxia, abdominal pain, and vomiting EXAM: CT ANGIOGRAPHY CHEST CT ABDOMEN AND PELVIS WITH CONTRAST TECHNIQUE: Multidetector CT imaging of the chest was performed using the standard protocol during bolus administration of intravenous contrast. Multiplanar CT image reconstructions and MIPs were obtained to evaluate the vascular anatomy. Multidetector CT imaging of the abdomen and pelvis was performed using the standard protocol during bolus administration of intravenous contrast. RADIATION DOSE REDUCTION: This exam was performed according to the departmental dose-optimization program which includes automated exposure control, adjustment of the mA and/or kV according to patient size and/or use of iterative reconstruction technique. CONTRAST:  75mL OMNIPAQUE IOHEXOL 350 MG/ML SOLN COMPARISON:  CT abdomen and pelvis dated 03/04/2021 FINDINGS: CTA CHEST FINDINGS Cardiovascular: The study is high quality for the evaluation of pulmonary embolism. There are no filling defects in the central, lobar, segmental or subsegmental pulmonary artery branches to suggest acute pulmonary embolism. Great vessels are normal in course and caliber. Normal heart size. No significant pericardial fluid/thickening. Coronary artery calcifications. Mediastinum/Nodes: Imaged thyroid gland without nodules meeting criteria for imaging follow-up by size. Large hiatal hernia. No pathologically enlarged axillary, supraclavicular, mediastinal, or hilar lymph nodes. Lungs/Pleura: The central airways are patent. Mild diffuse bronchial wall thickening. Left lower lobe relaxation atelectasis. No pneumothorax. Moderate left pleural effusion. Musculoskeletal: No acute or abnormal lytic or blastic osseous lesions. Review of the MIP images confirms the above findings. CT ABDOMEN and PELVIS FINDINGS Hepatobiliary: Subtle 2.8 x 2.5 cm hypoenhancing lesion along the medial inferior right hepatic lobe (4:30) not well seen on prior  noncontrast-enhanced examination. 1.3 x 1.1 cm arterially enhancing focus within peripheral segment 5 (4:19) possibly flash filling hemangioma or perfusional variation no intra or extrahepatic biliary ductal dilation. Normal gallbladder. Pancreas: No focal lesions or main ductal dilation. Spleen: Normal in size without focal abnormality. Adrenals/Urinary Tract: No adrenal nodules. Marked interval decrease in size of the right kidney. 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 A few scattered additional right renal stones measuring up to 3 mm in the interpolar right kidney. No suspicious renal mass or hydronephrosis. No focal bladder wall thickening. Stomach/Bowel: Normal appearance of the stomach. No evidence of bowel wall thickening, distention, or inflammatory changes. Appendix is not discretely seen. Vascular/Lymphatic: Aortic atherosclerosis. No enlarged abdominal or pelvic lymph nodes. Reproductive: No adnexal masses. Other: Small volume right sided free fluid. No free air or fluid collection. Musculoskeletal: No acute or abnormal lytic or blastic osseous lesions. Postsurgical changes from thoracolumbar spinal fusion. Unchanged fracture of the right fusion rod. IMPRESSION:  CTA CHEST: 1. No evidence of pulmonary embolism. 2. Moderate left pleural effusion with associated relaxation atelectasis. CT ABDOMEN/PELVIS: 1. Subtle 2.8 x 2.5 cm hypoenhancing lesion along the medial inferior right hepatic lobe, not well seen on prior noncontrast-enhanced examination. Recommend further evaluation with nonemergent contrast-enhanced MRI. 2. Small volume right sided free fluid. 3. Marked interval decrease in size of the right kidney. 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 A few scattered additional right renal stones measuring up to 3 mm in the interpolar right kidney. 4. Large hiatal hernia. 5. Aortic Atherosclerosis (ICD10-I70.0). Coronary artery calcifications. Assessment for potential risk factor  modification, dietary therapy or pharmacologic therapy may be warranted, if clinically indicated. Electronically Signed   By: Agustin Cree M.D.   On: 05/24/2022 15:53   CT ABDOMEN PELVIS W CONTRAST  Result Date: 05/24/2022 CLINICAL DATA:  Urinary tract infection and COVID positive with hypoxia, abdominal pain, and vomiting EXAM: CT ANGIOGRAPHY CHEST CT ABDOMEN AND PELVIS WITH CONTRAST TECHNIQUE: Multidetector CT imaging of the chest was performed using the standard protocol during bolus administration of intravenous contrast. Multiplanar CT image reconstructions and MIPs were obtained to evaluate the vascular anatomy. Multidetector CT imaging of the abdomen and pelvis was performed using the standard protocol during bolus administration of intravenous contrast. RADIATION DOSE REDUCTION: This exam was performed according to the departmental dose-optimization program which includes automated exposure control, adjustment of the mA and/or kV according to patient size and/or use of iterative reconstruction technique. CONTRAST:  75mL OMNIPAQUE IOHEXOL 350 MG/ML SOLN COMPARISON:  CT abdomen and pelvis dated 03/04/2021 FINDINGS: CTA CHEST FINDINGS Cardiovascular: The study is high quality for the evaluation of pulmonary embolism. There are no filling defects in the central, lobar, segmental or subsegmental pulmonary artery branches to suggest acute pulmonary embolism. Great vessels are normal in course and caliber. Normal heart size. No significant pericardial fluid/thickening. Coronary artery calcifications. Mediastinum/Nodes: Imaged thyroid gland without nodules meeting criteria for imaging follow-up by size. Large hiatal hernia. No pathologically enlarged axillary, supraclavicular, mediastinal, or hilar lymph nodes. Lungs/Pleura: The central airways are patent. Mild diffuse bronchial wall thickening. Left lower lobe relaxation atelectasis. No pneumothorax. Moderate left pleural effusion. Musculoskeletal: No acute or  abnormal lytic or blastic osseous lesions. Review of the MIP images confirms the above findings. CT ABDOMEN and PELVIS FINDINGS Hepatobiliary: Subtle 2.8 x 2.5 cm hypoenhancing lesion along the medial inferior right hepatic lobe (4:30) not well seen on prior noncontrast-enhanced examination. 1.3 x 1.1 cm arterially enhancing focus within peripheral segment 5 (4:19) possibly flash filling hemangioma or perfusional variation no intra or extrahepatic biliary ductal dilation. Normal gallbladder. Pancreas: No focal lesions or main ductal dilation. Spleen: Normal in size without focal abnormality. Adrenals/Urinary Tract: No adrenal nodules. Marked interval decrease in size of the right kidney. 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 A few scattered additional right renal stones measuring up to 3 mm in the interpolar right kidney. No suspicious renal mass or hydronephrosis. No focal bladder wall thickening. Stomach/Bowel: Normal appearance of the stomach. No evidence of bowel wall thickening, distention, or inflammatory changes. Appendix is not discretely seen. Vascular/Lymphatic: Aortic atherosclerosis. No enlarged abdominal or pelvic lymph nodes. Reproductive: No adnexal masses. Other: Small volume right sided free fluid. No free air or fluid collection. Musculoskeletal: No acute or abnormal lytic or blastic osseous lesions. Postsurgical changes from thoracolumbar spinal fusion. Unchanged fracture of the right fusion rod. IMPRESSION: CTA CHEST: 1. No evidence of pulmonary embolism. 2. Moderate left pleural effusion with  associated relaxation atelectasis. CT ABDOMEN/PELVIS: 1. Subtle 2.8 x 2.5 cm hypoenhancing lesion along the medial inferior right hepatic lobe, not well seen on prior noncontrast-enhanced examination. Recommend further evaluation with nonemergent contrast-enhanced MRI. 2. Small volume right sided free fluid. 3. Marked interval decrease in size of the right kidney. Previously noted staghorn  calculus is no longer seen. A few scattered additional right renal stones measuring up to 3 mm in the interpolar right kidney. 4. Large hiatal hernia. 5. Aortic Atherosclerosis (ICD10-I70.0). Coronary artery calcifications. Assessment for potential risk factor modification, dietary therapy or pharmacologic therapy may be warranted, if clinically indicated. Electronically Signed   By: Agustin Cree M.D.   On: 05/24/2022 15:53   CT HEAD WO CONTRAST ( )  Result Date: 05/24/2022 CLINICAL DATA:  Headache, sudden, severe. EXAM: CT HEAD WITHOUT CONTRAST TECHNIQUE: Contiguous axial images were obtained from the base of the skull through the vertex without intravenous contrast. RADIATION DOSE REDUCTION: This exam was performed according to the departmental dose-optimization program which includes automated exposure control, adjustment of the mA and/or kV according to patient size and/or use of iterative reconstruction technique. COMPARISON:  None Available. FINDINGS: Brain: No acute intracranial hemorrhage. 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 Cortical gray-white differentiation is otherwise preserved. No evidence of acute hydrocephalus. No extra-axial collection. No mass effect or midline shift. Vascular: No hyperdense vessel or unexpected calcification. Skull: No calvarial fracture or suspicious bone lesion. Skull base is unremarkable. Sinuses/Orbits: Unremarkable. Other: None. IMPRESSION: 1. No acute intracranial abnormality. 2. 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 Electronically Signed   By: Orvan Falconer M.D.   On: 05/24/2022 15:37   DG Chest Portable 1 View  Result Date: 05/24/2022 CLINICAL DATA:  Shortness of breath EXAM: PORTABLE CHEST 1 VIEW COMPARISON:  CXR 04/01/18 FINDINGS: No pleural effusion. No pneumothorax. Assessment of the lung apices is limited due to head positioning. Within this limitation there are patchy bilateral  airspace opacities, right-greater-than-left. No radiographically apparent displaced rib fractures. Visualized upper abdomen is unremarkable. Thoracolumbar spinal fusion hardware in place. IMPRESSION: Patchy bilateral airspace opacities, right-greater-than-left, concerning for multifocal pneumonia. Electronically Signed   By: Lorenza Cambridge M.D.   On: 05/24/2022 14:24   (Echo, Carotid, EGD, Colonoscopy, ERCP)    Subjective: Pt is nonverbal at baseline    Discharge Exam: Vitals:   05/27/22 1951 05/28/22 0300  BP: 131/79 (!) 157/74  Pulse: 76 70  Resp: 18 18  Temp: 98.8 F (37.1 C) 98.2 F (36.8 C)  SpO2: 97% 98%   Vitals:   05/27/22 0500 05/27/22 0900 05/27/22 1951 05/28/22 0300  BP: 105/77 124/72 131/79 (!) 157/74  Pulse: 77 63 76 70  Resp:  16 18 18   Temp: 97.8 F (36.6 C) 98.6 F (37 C) 98.8 F (37.1 C) 98.2 F (36.8 C)  TempSrc: Axillary Rectal    SpO2: 96% 97% 97% 98%  Weight:      Height:        General: Pt is alert, awake, not in acute distress Cardiovascular: S1/S2 +, no rubs, no gallops Respiratory: decreased breath sounds b/l otherwise clear  Abdominal: Soft, NT, ND, bowel sounds + Extremities: no edema, no cyanosis    The results of significant diagnostics from this hospitalization (including imaging, microbiology, ancillary and laboratory) are listed below for reference.     Microbiology: Recent Results (from the past 240 hour(s))  SARS Coronavirus 2 by RT PCR (hospital order, performed in [facility] hospital lab) *cepheid single result test* Urine, Clean Catch  Status: Abnormal   Collection Time: 05/24/22 12:51 PM   Specimen: Urine, Clean Catch; Nasal Swab  Result Value Ref Range Status   SARS Coronavirus 2 by RT PCR POSITIVE (A) NEGATIVE Final    Comment: (NOTE) SARS-CoV-2 target nucleic acids are DETECTED  SARS-CoV-2 RNA is generally detectable in upper respiratory specimens  during the acute phase of infection.  Positive results are indicative   of the presence of the identified virus, but do not rule out bacterial infection or co-infection with other pathogens not detected by the test.  Clinical correlation with patient history and  other diagnostic information is necessary to determine patient infection status.  The expected result is negative.  Fact Sheet for Patients:   RoadLapTop.co.za   Fact Sheet for Healthcare Providers:   http://kim-miller.com/    This test is not yet approved or cleared by the [place] FDA and  has been authorized for detection and/or diagnosis of SARS-CoV-2 by FDA under an Emergency Use Authorization (EUA).  This EUA will remain in effect (meaning this test can be used) for the duration of  the COVID-19 declaration under Section 564(b)(1)  of the Act, 21 U.S.C. section 360-bbb-3(b)(1), unless the authorization is terminated or revoked sooner.   Performed at [facility], [address] [place], [place] [address]   Urine Culture     Status: Abnormal   Collection Time: 05/24/22 12:51 PM   Specimen: Urine, Clean Catch  Result Value Ref Range Status   Specimen Description   Final    URINE, CLEAN CATCH Performed at [facility], [address] [place], [place] [address]    Special Requests   Final    NONE Performed at [facility], [address] [place], [place] [address]    Culture 60,000 COLONIES/mL KLEBSIELLA PNEUMONIAE (A)  Final   Report Status 05/26/2022 FINAL  Final   Organism ID, Bacteria KLEBSIELLA PNEUMONIAE (A)  Final      Susceptibility   Klebsiella pneumoniae - MIC*    AMPICILLIN >=32 RESISTANT Resistant     CEFAZOLIN <=4 SENSITIVE Sensitive     CEFEPIME <=0.12 SENSITIVE Sensitive     CEFTRIAXONE <=0.25 SENSITIVE Sensitive     CIPROFLOXACIN <=0.25 SENSITIVE Sensitive     GENTAMICIN <=1 SENSITIVE Sensitive     IMIPENEM <=0.25 SENSITIVE Sensitive     NITROFURANTOIN 128 RESISTANT Resistant      TRIMETH/SULFA <=20 SENSITIVE Sensitive     AMPICILLIN/SULBACTAM 4 SENSITIVE Sensitive     PIP/TAZO <=4 SENSITIVE Sensitive     * 60,000 COLONIES/mL KLEBSIELLA PNEUMONIAE  Blood culture (routine x 2)     Status: None (Preliminary result)   Collection Time: 05/24/22  6:20 PM   Specimen: BLOOD LEFT HAND  Result Value Ref Range Status   Specimen Description BLOOD LEFT HAND  Final   Special Requests   Final    BOTTLES DRAWN AEROBIC ONLY Blood Culture adequate volume   Culture   Final    NO GROWTH 3 DAYS Performed at [facility], [address] [place], [place] [address]    Report Status PENDING  Incomplete  Blood culture (routine x 2)     Status: None (Preliminary result)   Collection Time: 05/25/22 12:15 AM   Specimen: BLOOD  Result Value Ref Range Status   Specimen Description BLOOD BLOOD RIGHT ARM  Final   Special Requests   Final    BOTTLES DRAWN AEROBIC AND ANAEROBIC Blood Culture adequate volume   Culture  Final    NO GROWTH 2 DAYS Performed at [facility], [address] [place], [place] [address]    Report Status PENDING  Incomplete     Labs: BNP (last 3 results) No results for input(s): "BNP" in the last 8760 hours. Basic Metabolic Panel: Recent Labs  Lab 05/24/22 1251 05/25/22 0446 05/26/22 0853 05/27/22 0725  NA 145 138 140 141  K 4.2 4.4 3.9 3.9  CL 108 104 100 106  CO2 28 25 30 23   GLUCOSE 108* 119* 88 79  BUN 23* 17 24* 30*  CREATININE 0.71 0.68 0.80 0.78  CALCIUM 8.8* 8.9 8.8* 8.4*   Liver Function Tests: Recent Labs  Lab 05/24/22 1251  AST 40  ALT 38  ALKPHOS 212*  BILITOT 0.6  PROT 7.2  ALBUMIN 3.5   Recent Labs  Lab 05/24/22 1251  LIPASE 27   No results for input(s): "AMMONIA" in the last 168 hours. CBC: Recent Labs  Lab 05/24/22 1251 05/25/22 0446 05/26/22 0853 05/27/22 0725 05/28/22 0722  WBC 4.3 3.3* 3.9* 3.7* 2.8*  HGB 13.1 11.6* 13.6 12.5 12.2  HCT 42.7 36.7 41.8 40.4 38.3  MCV 93.8 92.0 88.9 92.4  89.9  PLT 108* 104* 113* 114* 120*   Cardiac Enzymes: No results for input(s): "CKTOTAL", "CKMB", "CKMBINDEX", "TROPONINI" in the last 168 hours. BNP: Invalid input(s): "POCBNP" CBG: No results for input(s): "GLUCAP" in the last 168 hours. D-Dimer No results for input(s): "DDIMER" in the last 72 hours. Hgb A1c No results for input(s): "HGBA1C" in the last 72 hours. Lipid Profile No results for input(s): "CHOL", "HDL", "LDLCALC", "TRIG", "CHOLHDL", "LDLDIRECT" in the last 72 hours. Thyroid function studies No results for input(s): "TSH", "T4TOTAL", "T3FREE", "THYROIDAB" in the last 72 hours.  Invalid input(s): "FREET3" Anemia work up No results for input(s): "VITAMINB12", "FOLATE", "FERRITIN", "TIBC", "IRON", "RETICCTPCT" in the last 72 hours. Urinalysis    Component Value Date/Time   COLORURINE AMBER (A) 05/24/2022 1251   APPEARANCEUR HAZY (A) 05/24/2022 1251   LABSPEC 1.024 05/24/2022 1251   PHURINE 5.0 05/24/2022 1251   GLUCOSEU NEGATIVE 05/24/2022 1251   HGBUR SMALL (A) 05/24/2022 1251   BILIRUBINUR NEGATIVE 05/24/2022 1251   KETONESUR NEGATIVE 05/24/2022 1251   PROTEINUR 30 (A) 05/24/2022 1251   NITRITE POSITIVE (A) 05/24/2022 1251   LEUKOCYTESUR TRACE (A) 05/24/2022 1251   Sepsis Labs Recent Labs  Lab 05/25/22 0446 05/26/22 0853 05/27/22 0725 05/28/22 0722  WBC 3.3* 3.9* 3.7* 2.8*   Microbiology Recent Results (from the past 240 hour(s))  SARS Coronavirus 2 by RT PCR (hospital order, performed in [facility] hospital lab) *cepheid single result test* Urine, Clean Catch     Status: Abnormal   Collection Time: 05/24/22 12:51 PM   Specimen: Urine, Clean Catch; Nasal Swab  Result Value Ref Range Status   SARS Coronavirus 2 by RT PCR POSITIVE (A) NEGATIVE Final    Comment: (NOTE) SARS-CoV-2 target nucleic acids are DETECTED  SARS-CoV-2 RNA is generally detectable in upper respiratory specimens  during the acute phase of infection.  Positive results are  indicative  of the presence of the identified virus, but do not rule out bacterial infection or co-infection with other pathogens not detected by the test.  Clinical correlation with patient history and  other diagnostic information is necessary to determine patient infection status.  The expected result is negative.  Fact Sheet for Patients:   RoadLapTop.co.za   Fact Sheet for Healthcare Providers:   http://kim-miller.com/    This  test is not yet approved or cleared by the [place] and  has been authorized for detection and/or diagnosis of SARS-CoV-2 by FDA under an Emergency Use Authorization (EUA).  This EUA will remain in effect (meaning this test can be used) for the duration of  the COVID-19 declaration under Section 564(b)(1)  of the Act, 21 U.S.C. section 360-bbb-3(b)(1), unless the authorization is terminated or revoked sooner.   Performed at [facility], [address] [place], [place] [address]   Urine Culture     Status: Abnormal   Collection Time: 05/24/22 12:51 PM   Specimen: Urine, Clean Catch  Result Value Ref Range Status   Specimen Description   Final    URINE, CLEAN CATCH Performed at [facility], [address], [place], [place] [address]    Special Requests   Final    NONE Performed at [facility], [address] [place], [place] [address]    Culture 60,000 COLONIES/mL KLEBSIELLA PNEUMONIAE (A)  Final   Report Status 05/26/2022 FINAL  Final   Organism ID, Bacteria KLEBSIELLA PNEUMONIAE (A)  Final      Susceptibility   Klebsiella pneumoniae - MIC*    AMPICILLIN >=32 RESISTANT Resistant     CEFAZOLIN <=4 SENSITIVE Sensitive     CEFEPIME <=0.12 SENSITIVE Sensitive     CEFTRIAXONE <=0.25 SENSITIVE Sensitive     CIPROFLOXACIN <=0.25 SENSITIVE Sensitive     GENTAMICIN <=1 SENSITIVE Sensitive     IMIPENEM <=0.25 SENSITIVE Sensitive     NITROFURANTOIN 128 RESISTANT  Resistant     TRIMETH/SULFA <=20 SENSITIVE Sensitive     AMPICILLIN/SULBACTAM 4 SENSITIVE Sensitive     PIP/TAZO <=4 SENSITIVE Sensitive     * 60,000 COLONIES/mL KLEBSIELLA PNEUMONIAE  Blood culture (routine x 2)     Status: None (Preliminary result)   Collection Time: 05/24/22  6:20 PM   Specimen: BLOOD LEFT HAND  Result Value Ref Range Status   Specimen Description BLOOD LEFT HAND  Final   Special Requests   Final    BOTTLES DRAWN AEROBIC ONLY Blood Culture adequate volume   Culture   Final    NO GROWTH 3 DAYS Performed at [facility], [address] [place], [place] [address]    Report Status PENDING  Incomplete  Blood culture (routine x 2)     Status: None (Preliminary result)   Collection Time: 05/25/22 12:15 AM   Specimen: BLOOD  Result Value Ref Range Status   Specimen Description BLOOD BLOOD RIGHT ARM  Final   Special Requests   Final    BOTTLES DRAWN AEROBIC AND ANAEROBIC Blood Culture adequate volume   Culture   Final    NO GROWTH 2 DAYS Performed at [facility], [address] [place], [place] [address]    Report Status PENDING  Incomplete     Time coordinating discharge: Over 30 minutes  SIGNED:   Charise Killian, MD  [facility] 05/28/2022, 7:58 AM Pager   If 7PM-7AM, please contact night-coverage [email or web address]

## 2022-05-28 NOTE — TOC Transition Note (Signed)
Transition of Care [facility]) - CM/SW Discharge Note   Patient Details  Name: [name] MRN: [id number] Date of Birth: [birth year]  Transition of Care [facility] CM/SW Contact:  [name], RN Phone Number: 05/28/2022, 10:12 AM   Clinical Narrative:      Patient will DC to: [facility] Anticipated DC date: 05/28/22  Family notified: Mother [name] Transport by: Group home  Per MD patient ready for DC to . RN, , patient's family, and facility notified of DC. Discharge Summary sent to facility. RN given number for report to RN Ulyess Blossom. DC packet on chart. TOC signing off.  Bevelyn Ngo [facility] [phone]        Patient Goals and CMS Choice      Discharge Placement                         Discharge Plan and Services Additional resources added to the After Visit Summary for                                       Social Determinants of Health (SDOH) Interventions SDOH Screenings   Tobacco Use: Low Risk  (05/24/2022)     Readmission Risk Interventions     No data to display

## 2022-05-28 NOTE — Plan of Care (Signed)

## 2022-05-28 NOTE — NC FL2 (Signed)
[name] MEDICAID FL2 LEVEL OF CARE FORM     IDENTIFICATION  Patient Name: [name] Birthdate: 1966-01-02 Sex: female Admission Date (Current Location): 05/24/2022  [facility] and [place] Number:  Chiropodist and Address:         Provider Number: [id number]  Attending Physician Name and Address:  Charise Killian, MD  Relative Name and Phone Number:       Current Level of Care: Hospital Recommended Level of Care: [facility], Other (Comment) (group home) Prior Approval Number:    Date Approved/Denied:   PASRR Number:    Discharge Plan: Other (Comment) (group home)    Current Diagnoses: Patient Active Problem List   Diagnosis Date Noted   COVID-19 virus infection 05/24/2022   Cerebral palsy (HCC) 05/24/2022   Seizure disorder (HCC) 05/24/2022   UTI (urinary tract infection) 05/24/2022   Thrombocytopenia (HCC) 05/24/2022   Nausea & vomiting 05/24/2022   Hypothyroidism 05/24/2022   Pleural effusion on left 05/24/2022   Liver lesion 05/24/2022   Mental retardation 02/21/2015   Seizure disorder, primary (HCC) 02/21/2015   Menopause 02/21/2015   Abnormal liver function tests 02/21/2015    Orientation RESPIRATION BLADDER Height & Weight      (nonverbal)  Normal Incontinent Weight: 48.3 kg Height:  4\' 10"  (147.3 cm)  BEHAVIORAL SYMPTOMS/MOOD NEUROLOGICAL BOWEL NUTRITION STATUS      Incontinent Diet (dys 2)  AMBULATORY STATUS COMMUNICATION OF NEEDS Skin   Total Care Non-Verbally Bruising                       Personal Care Assistance Level of Assistance              Functional Limitations Info             SPECIAL CARE FACTORS FREQUENCY                       Contractures Contractures Info: Present    Additional Factors Info  Code Status, Allergies, Isolation Precautions Code Status Info: DNR Allergies Info: amantadines     Isolation Precautions Info: contact and airborne     Medication List       STOP  taking these medications     cholecalciferol 1000 units tablet Commonly known as: VITAMIN D    nitrofurantoin (macrocrystal-monohydrate) 100 MG capsule Commonly known as: MACROBID    Poly Fe Cmplx-FeHemPoly-FA-B12 22-6-1-0.025 MG Tabs    POLYSACCH FE CMP-FE HEME POLY PO    potassium chloride 10 MEQ tablet Commonly known as: KLOR-CON M           TAKE these medications     aspirin EC 325 MG tablet Take 325 mg by mouth daily.    azelastine 0.05 % ophthalmic solution Commonly known as: OPTIVAR Place 1 drop into both eyes 2 (two) times daily.    baclofen 20 MG tablet Commonly known as: LIORESAL Take 20 mg by mouth 3 (three) times daily.    carbamazepine 200 MG 12 hr tablet Commonly known as: TEGRETOL XR Take 200 mg by mouth 2 (two) times daily.    cetirizine 10 MG tablet Commonly known as: ZYRTEC Take 10 mg by mouth daily as needed for allergies.    ciprofloxacin 500 MG tablet Commonly known as: CIPRO Take 1 tablet (500 mg total) by mouth 2 (two) times daily for 6 days.    dicyclomine 10 MG capsule Commonly known as: BENTYL Take 10 mg by mouth  4 (four) times daily -  before meals and at bedtime.    DSS 100 MG Caps Take 100 mg by mouth 2 (two) times daily.    eucerin cream Apply topically as needed for dry skin.    furosemide 20 MG tablet Commonly known as: LASIX Take 20 mg by mouth as needed.    hydrOXYzine 25 MG tablet Commonly known as: ATARAX Take 25 mg by mouth 3 (three) times daily.    iron polysaccharides 150 MG capsule Commonly known as: NIFEREX Take 1 capsule by mouth daily.    levothyroxine 75 MCG tablet Commonly known as: SYNTHROID Take 75 mcg by mouth daily before breakfast.    LORazepam 1 MG tablet Commonly known as: ATIVAN Take 1 mg by mouth as needed.    metoCLOPramide 5 MG tablet Commonly known as: REGLAN Take 5 mg by mouth 4 (four) times daily as needed for nausea or vomiting.    mirtazapine 15 MG tablet Commonly known as:  REMERON Take 15 mg by mouth at bedtime.    multivitamin capsule Take 1 capsule by mouth daily.    OLANZapine 5 MG tablet Commonly known as: ZYPREXA Take 5-10 mg by mouth 3 (three) times daily.    omeprazole 20 MG capsule Commonly known as: PRILOSEC Take 20 mg by mouth 2 (two) times daily.    predniSONE 20 MG tablet Commonly known as: DELTASONE Take 2 tablets (40 mg total) by mouth daily with breakfast for 5 days.    sucralfate 1 GM/10ML suspension Commonly known as: CARAFATE Take 1 g by mouth 4 (four) times daily -  with meals and at bedtime.    Sulfacetamide Sodium (Acne) 10 % Lotn Apply topically 2 (two) times daily.    tamsulosin 0.4 MG Caps capsule Commonly known as: FLOMAX Take 0.4 mg by mouth at bedtime.    UNABLE TO FIND Med Name: CBD Oil 500mg /73mL.  1 dropper under tongue BID.      Relevant Imaging Results:  Relevant Lab Results:   Additional Information    [name], RN

## 2022-05-29 LAB — CULTURE, BLOOD (ROUTINE X 2): Special Requests: ADEQUATE

## 2022-08-18 ENCOUNTER — Inpatient Hospital Stay
Admit: 2022-08-18 | Discharge: 2022-08-18 | Disposition: A | Discharge status: Home / Self Care | Payer: Medicare Other | Attending: Family Medicine | Admitting: Family Medicine

## 2022-08-18 ENCOUNTER — Other Ambulatory Visit: Payer: Self-pay

## 2022-08-18 ENCOUNTER — Emergency Department: Payer: Medicare Other

## 2022-08-18 ENCOUNTER — Inpatient Hospital Stay
Admission: EM | Admit: 2022-08-18 | Discharge: 2022-08-22 | DRG: 871 | Disposition: A | Discharge status: Skilled Nursing Facility | Payer: Medicare Other | Attending: Internal Medicine | Admitting: Internal Medicine

## 2022-08-18 DIAGNOSIS — M419 Scoliosis, unspecified: Secondary | ICD-10-CM | POA: Diagnosis present

## 2022-08-18 DIAGNOSIS — Z87442 Personal history of urinary calculi: Secondary | ICD-10-CM | POA: Diagnosis not present

## 2022-08-18 DIAGNOSIS — Z79890 Hormone replacement therapy (postmenopausal): Secondary | ICD-10-CM | POA: Diagnosis not present

## 2022-08-18 DIAGNOSIS — G40909 Epilepsy, unspecified, not intractable, without status epilepticus: Secondary | ICD-10-CM | POA: Diagnosis present

## 2022-08-18 DIAGNOSIS — N390 Urinary tract infection, site not specified: Secondary | ICD-10-CM

## 2022-08-18 DIAGNOSIS — Z1152 Encounter for screening for COVID-19: Secondary | ICD-10-CM

## 2022-08-18 DIAGNOSIS — B961 Klebsiella pneumoniae [K. pneumoniae] as the cause of diseases classified elsewhere: Secondary | ICD-10-CM | POA: Diagnosis present

## 2022-08-18 DIAGNOSIS — E8721 Acute metabolic acidosis: Secondary | ICD-10-CM | POA: Diagnosis present

## 2022-08-18 DIAGNOSIS — Z7982 Long term (current) use of aspirin: Secondary | ICD-10-CM | POA: Diagnosis not present

## 2022-08-18 DIAGNOSIS — Z96659 Presence of unspecified artificial knee joint: Secondary | ICD-10-CM | POA: Diagnosis present

## 2022-08-18 DIAGNOSIS — F72 Severe intellectual disabilities: Secondary | ICD-10-CM | POA: Diagnosis present

## 2022-08-18 DIAGNOSIS — Z888 Allergy status to other drugs, medicaments and biological substances status: Secondary | ICD-10-CM

## 2022-08-18 DIAGNOSIS — R6520 Severe sepsis without septic shock: Secondary | ICD-10-CM | POA: Diagnosis present

## 2022-08-18 DIAGNOSIS — G809 Cerebral palsy, unspecified: Secondary | ICD-10-CM

## 2022-08-18 DIAGNOSIS — Z1611 Resistance to penicillins: Secondary | ICD-10-CM | POA: Diagnosis present

## 2022-08-18 DIAGNOSIS — Z66 Do not resuscitate: Secondary | ICD-10-CM | POA: Diagnosis present

## 2022-08-18 DIAGNOSIS — A4150 Gram-negative sepsis, unspecified: Secondary | ICD-10-CM | POA: Diagnosis present

## 2022-08-18 DIAGNOSIS — Z96649 Presence of unspecified artificial hip joint: Secondary | ICD-10-CM | POA: Diagnosis present

## 2022-08-18 DIAGNOSIS — J9601 Acute respiratory failure with hypoxia: Secondary | ICD-10-CM | POA: Diagnosis present

## 2022-08-18 DIAGNOSIS — F419 Anxiety disorder, unspecified: Secondary | ICD-10-CM | POA: Diagnosis present

## 2022-08-18 DIAGNOSIS — E039 Hypothyroidism, unspecified: Secondary | ICD-10-CM

## 2022-08-18 DIAGNOSIS — Z79899 Other long term (current) drug therapy: Secondary | ICD-10-CM

## 2022-08-18 DIAGNOSIS — I5031 Acute diastolic (congestive) heart failure: Secondary | ICD-10-CM | POA: Diagnosis not present

## 2022-08-18 DIAGNOSIS — Z87440 Personal history of urinary (tract) infections: Secondary | ICD-10-CM | POA: Diagnosis not present

## 2022-08-18 DIAGNOSIS — B9689 Other specified bacterial agents as the cause of diseases classified elsewhere: Secondary | ICD-10-CM | POA: Diagnosis not present

## 2022-08-18 DIAGNOSIS — G808 Other cerebral palsy: Secondary | ICD-10-CM | POA: Diagnosis not present

## 2022-08-18 LAB — CBC WITH DIFFERENTIAL/PLATELET
Abs Immature Granulocytes: 0.01 10*3/uL (ref 0.00–0.07)
Basophils Absolute: 0.1 10*3/uL (ref 0.0–0.1)
Basophils Relative: 1 %
Eosinophils Absolute: 0.4 10*3/uL (ref 0.0–0.5)
Eosinophils Relative: 6 %
HCT: 40.8 % (ref 36.0–46.0)
Hemoglobin: 13 g/dL (ref 12.0–15.0)
Immature Granulocytes: 0 %
Lymphocytes Relative: 23 %
Lymphs Abs: 1.5 10*3/uL (ref 0.7–4.0)
MCH: 29.3 pg (ref 26.0–34.0)
MCHC: 31.9 g/dL (ref 30.0–36.0)
MCV: 92.1 fL (ref 80.0–100.0)
Monocytes Absolute: 0.5 10*3/uL (ref 0.1–1.0)
Monocytes Relative: 8 %
Neutro Abs: 4.1 10*3/uL (ref 1.7–7.7)
Neutrophils Relative %: 62 %
Platelets: 157 10*3/uL (ref 150–400)
RBC: 4.43 MIL/uL (ref 3.87–5.11)
RDW: 14.8 % (ref 11.5–15.5)
WBC: 6.6 10*3/uL (ref 4.0–10.5)
nRBC: 0 % (ref 0.0–0.2)

## 2022-08-18 LAB — CBC
HCT: 38.4 % (ref 36.0–46.0)
Hemoglobin: 12 g/dL (ref 12.0–15.0)
MCH: 28.4 pg (ref 26.0–34.0)
MCHC: 31.3 g/dL (ref 30.0–36.0)
MCV: 90.8 fL (ref 80.0–100.0)
Platelets: 131 10*3/uL — ABNORMAL LOW (ref 150–400)
RBC: 4.23 MIL/uL (ref 3.87–5.11)
RDW: 14.7 % (ref 11.5–15.5)
WBC: 4.4 10*3/uL (ref 4.0–10.5)
nRBC: 0 % (ref 0.0–0.2)

## 2022-08-18 LAB — URINALYSIS, W/ REFLEX TO CULTURE (INFECTION SUSPECTED)
Bilirubin Urine: NEGATIVE
Glucose, UA: NEGATIVE mg/dL
Hgb urine dipstick: NEGATIVE
Ketones, ur: NEGATIVE mg/dL
Nitrite: NEGATIVE
Protein, ur: NEGATIVE mg/dL
Specific Gravity, Urine: 1.021 (ref 1.005–1.030)
pH: 6 (ref 5.0–8.0)

## 2022-08-18 LAB — BASIC METABOLIC PANEL
Anion gap: 5 (ref 5–15)
BUN: 23 mg/dL — ABNORMAL HIGH (ref 6–20)
CO2: 28 mmol/L (ref 22–32)
Calcium: 8.6 mg/dL — ABNORMAL LOW (ref 8.9–10.3)
Chloride: 108 mmol/L (ref 98–111)
Creatinine, Ser: 0.75 mg/dL (ref 0.44–1.00)
GFR, Estimated: >60 mL/min (ref >60)
Glucose, Bld: 102 mg/dL — ABNORMAL HIGH (ref 70–99)
Potassium: 4.3 mmol/L (ref 3.5–5.1)
Sodium: 141 mmol/L (ref 135–145)

## 2022-08-18 LAB — CULTURE, BLOOD (ROUTINE X 2)
Culture: NO GROWTH
Culture: NO GROWTH
Special Requests: ADEQUATE

## 2022-08-18 LAB — COMPREHENSIVE METABOLIC PANEL
ALT: 24 U/L (ref 0–44)
AST: 25 U/L (ref 15–41)
Albumin: 3.4 g/dL — ABNORMAL LOW (ref 3.5–5.0)
Alkaline Phosphatase: 251 U/L — ABNORMAL HIGH (ref 38–126)
Anion gap: 7 (ref 5–15)
BUN: 25 mg/dL — ABNORMAL HIGH (ref 6–20)
CO2: 27 mmol/L (ref 22–32)
Calcium: 8.8 mg/dL — ABNORMAL LOW (ref 8.9–10.3)
Chloride: 109 mmol/L (ref 98–111)
Creatinine, Ser: 0.84 mg/dL (ref 0.44–1.00)
GFR, Estimated: >60 mL/min (ref >60)
Glucose, Bld: 132 mg/dL — ABNORMAL HIGH (ref 70–99)
Potassium: 4.3 mmol/L (ref 3.5–5.1)
Sodium: 143 mmol/L (ref 135–145)
Total Bilirubin: 0.4 mg/dL (ref 0.3–1.2)
Total Protein: 7.2 g/dL (ref 6.5–8.1)

## 2022-08-18 LAB — SARS CORONAVIRUS 2 BY RT PCR: SARS Coronavirus 2 by RT PCR: NEGATIVE

## 2022-08-18 LAB — CORTISOL-AM, BLOOD: Cortisol - AM: 11.5 ug/dL (ref 6.7–22.6)

## 2022-08-18 LAB — LIPASE, BLOOD: Lipase: 42 U/L (ref 11–51)

## 2022-08-18 LAB — PROTIME-INR
INR: 1.1 (ref 0.8–1.2)
INR: 1.1 (ref 0.8–1.2)
Prothrombin Time: 14.6 seconds (ref 11.4–15.2)
Prothrombin Time: 14.8 seconds (ref 11.4–15.2)

## 2022-08-18 LAB — LACTIC ACID, PLASMA
Lactic Acid, Venous: 4.3 mmol/L (ref 0.5–1.9)
Lactic Acid, Venous: 1 mmol/L (ref 0.5–1.9)

## 2022-08-18 LAB — PROCALCITONIN
Procalcitonin: <0.1 ng/mL
Procalcitonin: <0.1 ng/mL

## 2022-08-18 LAB — BRAIN NATRIURETIC PEPTIDE: B Natriuretic Peptide: 52.7 pg/mL (ref 0.0–100.0)

## 2022-08-18 MED ORDER — ORAL CARE MOUTH RINSE: 15.0000 mL | OROMUCOSAL | Status: DC | PRN

## 2022-08-18 MED ORDER — METOCLOPRAMIDE HCL 10 MG PO TABS: 5.0000 mg | ORAL_TABLET | Freq: Four times a day (QID) | ORAL | Status: DC | PRN

## 2022-08-18 MED ORDER — POLYSACCHARIDE IRON COMPLEX 150 MG PO CAPS
150.0000 mg | ORAL_CAPSULE | Freq: Every day | ORAL | Status: DC
  Administered 2022-08-18 – 2022-08-22 (×5): 150 mg via ORAL
  Filled 2022-08-18 (×5): qty 1

## 2022-08-18 MED ORDER — LORAZEPAM 1 MG PO TABS
1.0000 mg | ORAL_TABLET | Freq: Four times a day (QID) | ORAL | Status: DC | PRN
  Administered 2022-08-22: 1 mg via ORAL
  Filled 2022-08-18: qty 1

## 2022-08-18 MED ORDER — SULFACETAMIDE SODIUM (ACNE) 10 % EX LOTN: TOPICAL_LOTION | Freq: Two times a day (BID) | CUTANEOUS | Status: DC

## 2022-08-18 MED ORDER — FUROSEMIDE 10 MG/ML IJ SOLN
40.0000 mg | Freq: Every day | INTRAMUSCULAR | Status: DC
  Administered 2022-08-18: 40 mg via INTRAVENOUS
  Filled 2022-08-18: qty 4

## 2022-08-18 MED ORDER — LORATADINE 10 MG PO TABS
10.0000 mg | ORAL_TABLET | Freq: Every day | ORAL | Status: DC
  Administered 2022-08-18 – 2022-08-22 (×5): 10 mg via ORAL
  Filled 2022-08-18 (×5): qty 1

## 2022-08-18 MED ORDER — SUCRALFATE 1 GM/10ML PO SUSP
1.0000 g | Freq: Three times a day (TID) | ORAL | Status: DC
  Administered 2022-08-18 – 2022-08-22 (×17): 1 g via ORAL
  Filled 2022-08-18 (×19): qty 10

## 2022-08-18 MED ORDER — CARBAMAZEPINE ER 200 MG PO TB12
200.0000 mg | ORAL_TABLET | Freq: Two times a day (BID) | ORAL | Status: DC
  Administered 2022-08-18 – 2022-08-22 (×9): 200 mg via ORAL
  Filled 2022-08-18 (×9): qty 1

## 2022-08-18 MED ORDER — MIRTAZAPINE 15 MG PO TABS
15.0000 mg | ORAL_TABLET | Freq: Every day | ORAL | Status: DC
  Administered 2022-08-18 – 2022-08-21 (×4): 15 mg via ORAL
  Filled 2022-08-18 (×4): qty 1

## 2022-08-18 MED ORDER — SODIUM CHLORIDE 0.9 % IV SOLN
1.0000 g | INTRAVENOUS | Status: AC
  Administered 2022-08-18: 1 g via INTRAVENOUS
  Filled 2022-08-18: qty 10

## 2022-08-18 MED ORDER — KETOTIFEN FUMARATE 0.035 % OP SOLN: 1.0000 [drp] | Freq: Two times a day (BID) | OPHTHALMIC | Status: DC | PRN

## 2022-08-18 MED ORDER — HYDROCOD POLI-CHLORPHE POLI ER 10-8 MG/5ML PO SUER
5.0000 mL | Freq: Two times a day (BID) | ORAL | Status: DC | PRN
  Filled 2022-08-18: qty 5

## 2022-08-18 MED ORDER — SODIUM CHLORIDE 0.9 % IV SOLN
1.0000 g | Freq: Once | INTRAVENOUS | Status: AC
  Administered 2022-08-18: 1 g via INTRAVENOUS
  Filled 2022-08-18: qty 10

## 2022-08-18 MED ORDER — DICYCLOMINE HCL 10 MG PO CAPS: 10.0000 mg | ORAL_CAPSULE | Freq: Four times a day (QID) | ORAL | Status: DC | PRN

## 2022-08-18 MED ORDER — OLANZAPINE 5 MG PO TABS
10.0000 mg | ORAL_TABLET | Freq: Two times a day (BID) | ORAL | Status: DC
  Administered 2022-08-18 – 2022-08-22 (×9): 10 mg via ORAL
  Filled 2022-08-18 (×9): qty 2

## 2022-08-18 MED ORDER — PANTOPRAZOLE SODIUM 40 MG PO TBEC
40.0000 mg | DELAYED_RELEASE_TABLET | Freq: Every day | ORAL | Status: DC
  Administered 2022-08-18 – 2022-08-22 (×5): 40 mg via ORAL
  Filled 2022-08-18 (×5): qty 1

## 2022-08-18 MED ORDER — SODIUM CHLORIDE 0.9 % IV SOLN: 1.0000 g | INTRAVENOUS | Status: DC

## 2022-08-18 MED ORDER — TAMSULOSIN HCL 0.4 MG PO CAPS
0.4000 mg | ORAL_CAPSULE | Freq: Every day | ORAL | Status: DC
  Administered 2022-08-18 – 2022-08-21 (×4): 0.4 mg via ORAL
  Filled 2022-08-18 (×4): qty 1

## 2022-08-18 MED ORDER — FUROSEMIDE 10 MG/ML IJ SOLN
20.0000 mg | Freq: Every day | INTRAMUSCULAR | Status: DC
  Administered 2022-08-19: 20 mg via INTRAVENOUS
  Filled 2022-08-18: qty 2

## 2022-08-18 MED ORDER — DOCUSATE SODIUM 100 MG PO CAPS
100.0000 mg | ORAL_CAPSULE | Freq: Two times a day (BID) | ORAL | Status: DC
  Administered 2022-08-18 – 2022-08-21 (×7): 100 mg via ORAL
  Filled 2022-08-18 (×9): qty 1

## 2022-08-18 MED ORDER — ENOXAPARIN SODIUM 40 MG/0.4ML IJ SOSY
40.0000 mg | PREFILLED_SYRINGE | INTRAMUSCULAR | Status: DC
  Administered 2022-08-18 – 2022-08-22 (×5): 40 mg via SUBCUTANEOUS
  Filled 2022-08-18 (×5): qty 0.4

## 2022-08-18 MED ORDER — IPRATROPIUM-ALBUTEROL 0.5-2.5 (3) MG/3ML IN SOLN: 3.0000 mL | Freq: Four times a day (QID) | RESPIRATORY_TRACT | Status: DC

## 2022-08-18 MED ORDER — SODIUM CHLORIDE 0.9 % IV SOLN: 1.0000 g | Freq: Once | INTRAVENOUS | Status: DC

## 2022-08-18 MED ORDER — SODIUM CHLORIDE 0.9 % IV SOLN: 2.0000 g | INTRAVENOUS | Status: DC

## 2022-08-18 MED ORDER — ASPIRIN 325 MG PO TBEC
325.0000 mg | DELAYED_RELEASE_TABLET | Freq: Every day | ORAL | Status: DC
  Administered 2022-08-18 – 2022-08-22 (×5): 325 mg via ORAL
  Filled 2022-08-18 (×5): qty 1

## 2022-08-18 MED ORDER — SODIUM CHLORIDE 0.9 % IV SOLN
2.0000 g | INTRAVENOUS | Status: DC
  Administered 2022-08-19 – 2022-08-20 (×2): 2 g via INTRAVENOUS
  Filled 2022-08-18 (×2): qty 20

## 2022-08-18 MED ORDER — HYDROXYZINE HCL 25 MG PO TABS
25.0000 mg | ORAL_TABLET | Freq: Three times a day (TID) | ORAL | Status: DC
  Administered 2022-08-18 – 2022-08-22 (×14): 25 mg via ORAL
  Filled 2022-08-18 (×14): qty 1

## 2022-08-18 MED ORDER — BACLOFEN 10 MG PO TABS
20.0000 mg | ORAL_TABLET | Freq: Three times a day (TID) | ORAL | Status: DC
  Administered 2022-08-18 – 2022-08-22 (×14): 20 mg via ORAL
  Filled 2022-08-18 (×14): qty 2

## 2022-08-18 MED ORDER — ORAL CARE MOUTH RINSE
15.0000 mL | OROMUCOSAL | Status: DC
  Administered 2022-08-18 – 2022-08-22 (×14): 15 mL via OROMUCOSAL

## 2022-08-18 MED ORDER — HALOPERIDOL LACTATE 5 MG/ML IJ SOLN
1.0000 mg | Freq: Four times a day (QID) | INTRAMUSCULAR | Status: DC | PRN
  Administered 2022-08-21 – 2022-08-22 (×2): 1 mg via INTRAMUSCULAR
  Filled 2022-08-18 (×2): qty 1

## 2022-08-18 MED ORDER — GUAIFENESIN ER 600 MG PO TB12
600.0000 mg | ORAL_TABLET | Freq: Two times a day (BID) | ORAL | Status: DC
  Administered 2022-08-18 – 2022-08-22 (×9): 600 mg via ORAL
  Filled 2022-08-18 (×9): qty 1

## 2022-08-18 MED ORDER — SODIUM CHLORIDE 0.9 % IV SOLN: INTRAVENOUS | Status: DC

## 2022-08-18 MED ORDER — ADULT MULTIVITAMIN W/MINERALS CH
1.0000 | ORAL_TABLET | Freq: Every day | ORAL | Status: DC
  Administered 2022-08-18 – 2022-08-22 (×5): 1 via ORAL
  Filled 2022-08-18 (×5): qty 1

## 2022-08-18 MED ORDER — IPRATROPIUM-ALBUTEROL 0.5-2.5 (3) MG/3ML IN SOLN: 3.0000 mL | RESPIRATORY_TRACT | Status: DC | PRN

## 2022-08-18 MED ORDER — LEVOTHYROXINE SODIUM 50 MCG PO TABS
75.0000 ug | ORAL_TABLET | Freq: Every day | ORAL | Status: DC
  Administered 2022-08-18 – 2022-08-22 (×5): 75 ug via ORAL
  Filled 2022-08-18 (×5): qty 2

## 2022-08-18 MED ORDER — SODIUM CHLORIDE 0.9 % IV SOLN: 500.0000 mg | Freq: Once | INTRAVENOUS | Status: DC

## 2022-08-18 NOTE — H&P (Addendum)
[place]   PATIENT NAME: [name]    MR#:  [id number]  DATE OF BIRTH:  02/02/1965  DATE OF ADMISSION:  08/18/2022  PRIMARY CARE PHYSICIAN: Lauro Regulus, MD   Patient is coming from: Home  REQUESTING/REFERRING PHYSICIAN: Loleta Rose, MD CHIEF COMPLAINT:   Chief Complaint  Patient presents with   Cough    HISTORY OF PRESENT ILLNESS:  [name] is a 57 y.o. female with medical history significant for mental retardation, seizure disorder, urolithiasis, and cerebral palsy, who presented to the emergency room with acute onset of cough with associated wheezing and no pulse extremity that was down to the low 80s especially with coughing.  In the ER she has been satting in the low 90s.  She has been slightly restless and combative in the ER.  No reported fever or chills.  No reported nausea or vomiting.  No other history could be obtained from the patient due to her advanced mental retardation.  ED Course: When she came to the ER, pulse oximetry was 90 to 91% on room air and it drops to 87 to 88% occasionally with cough, heart rate was 127 with respiratory rate of 21 and later 23 and BP 158/95 with otherwise normal vital signs.  Pulse oximetry was 98 to 100% on 2 L of O2 by nasal cannula.  Labs revealed a BUN of 25 with a glucose of 132 calcium 8.8 and alk phos 251 with albumin 3 4 and otherwise unremarkable CMP.  Lactic acid was 4.3 and later 1 and procalcitonin was less than 0.1.  CBC was within normal.  INR was 1.1 and PT 14.6.  BNP was 52.7.  UA was positive for UTI. EKG as reviewed by me : EKG showed sinus tachycardia with a rate of 134 with PVCs and aberrant conduction. Imaging: Portable chest x-ray showed mild cardiomegaly and mild pulm vascular congestion with large gastric hernia.  The patient was given a gram of IV Rocephin.  She will be admitted to a progressive unit bed for further evaluation and management. PAST MEDICAL HISTORY:   Past Medical  History:  Diagnosis Date   Acne    Cerebral palsy (HCC)    Kidney stones    Mental retardation    Myopia    Scoliosis    Seizure disorder (HCC)     PAST SURGICAL HISTORY:   Past Surgical History:  Procedure Laterality Date   PARTIAL HIP ARTHROPLASTY     REVISION TOTAL KNEE ARTHROPLASTY      SOCIAL HISTORY:   Social History   Tobacco Use   Smoking status: Never   Smokeless tobacco: Never  Substance Use Topics   Alcohol use: No    FAMILY HISTORY:   Family History  Problem Relation Age of Onset   Breast cancer Neg Hx    Ovarian cancer Neg Hx    Colon cancer Neg Hx     DRUG ALLERGIES:   Allergies  Allergen Reactions   Amantadines Nausea And Vomiting    REVIEW OF SYSTEMS:   ROS As per history of present illness otherwise no further history could be obtained due to the patient MR..  MEDICATIONS AT HOME:   Prior to Admission medications   Medication Sig Start Date End Date Taking? Authorizing Provider  aspirin 325 MG EC tablet Take 325 mg by mouth daily.    [provider]  azelastine (OPTIVAR) 0.05 % ophthalmic solution Place 1 drop into both eyes 2 (two)  times daily.    [provider]  baclofen (LIORESAL) 20 MG tablet Take 20 mg by mouth 3 (three) times daily.    [provider]  carbamazepine (TEGRETOL XR) 200 MG 12 hr tablet Take 200 mg by mouth 2 (two) times daily.    [provider]  cetirizine (ZYRTEC) 10 MG tablet Take 10 mg by mouth daily as needed for allergies.    [provider]  dicyclomine (BENTYL) 10 MG capsule Take 10 mg by mouth 4 (four) times daily -  before meals and at bedtime.    [provider]  Docusate Sodium (DSS) 100 MG CAPS Take 100 mg by mouth 2 (two) times daily. 05/14/21   [provider]  furosemide (LASIX) 20 MG tablet Take 20 mg by mouth as needed.     [provider]  hydrOXYzine (ATARAX/VISTARIL) 25 MG tablet Take 25 mg by mouth 3 (three) times daily.  09/06/19   [provider]  iron polysaccharides (NIFEREX) 150 MG capsule Take 1 capsule by mouth daily. 09/01/19   [provider]  levothyroxine (SYNTHROID) 75 MCG tablet Take 75 mcg by mouth daily before breakfast.    [provider]  LORazepam (ATIVAN) 1 MG tablet Take 1 mg by mouth as needed.     [provider]  metoCLOPramide (REGLAN) 5 MG tablet Take 5 mg by mouth 4 (four) times daily as needed for nausea or vomiting.    [provider]  mirtazapine (REMERON) 15 MG tablet Take 15 mg by mouth at bedtime.    [provider]  Multiple Vitamin (MULTIVITAMIN) capsule Take 1 capsule by mouth daily.    [provider]  OLANZapine (ZYPREXA) 5 MG tablet Take 5-10 mg by mouth 3 (three) times daily.    [provider]  omeprazole (PRILOSEC) 20 MG capsule Take 20 mg by mouth 2 (two) times daily.     [provider]  Skin Protectants, Misc. (EUCERIN) cream Apply topically as needed for dry skin.    [provider]  sucralfate (CARAFATE) 1 GM/10ML suspension Take 1 g by mouth 4 (four) times daily -  with meals and at bedtime.    [provider]  Sulfacetamide Sodium, Acne, 10 % LOTN Apply topically 2 (two) times daily.     [provider]  tamsulosin (FLOMAX) 0.4 MG CAPS capsule Take 0.4 mg by mouth at bedtime.    [provider]  UNABLE TO FIND Med Name: CBD Oil 500mg /2mL.  1 dropper under tongue BID.    [provider]      VITAL SIGNS:  Blood pressure (!) 155/93, pulse 90, temperature 98.5 F (36.9 C), temperature source Rectal, resp. rate (!) 23, SpO2 100%.  PHYSICAL EXAMINATION:  Physical Exam  GENERAL:  57 y.o.-year-old Caucasian female patient lying in the bed with no acute distress at rest.  EYES: Pupils equal, round, reactive to light and accommodation. No scleral icterus. Extraocular muscles intact.  HEENT: Head atraumatic, normocephalic. Oropharynx and  nasopharynx clear.  NECK:  Supple, no jugular venous distention. No thyroid enlargement, no tenderness.  LUNGS: Poor respiratory effort with occasional wheezing and mild rhonchi without crackles  No use of accessory muscles of respiration.  CARDIOVASCULAR: Regular rate and rhythm, S1, S2 normal. No murmurs, rubs, or gallops.  ABDOMEN: Soft, nondistended, nontender. Bowel sounds present. No organomegaly or mass.  EXTREMITIES: No pedal edema, cyanosis, or clubbing.  NEUROLOGIC: The patient has CP.  Exam is grossly nonfocal.  She is  not cooperative with exam.  PSYCHIATRIC: The patient is alert and with good eye contact.  She is mildly restless and can be combative with attempts at intervention. SKIN: No obvious rash, lesion, or ulcer.   LABORATORY PANEL:   CBC Recent Labs  Lab 08/18/22 0135  WBC 6.6  HGB 13.0  HCT 40.8  PLT 157   ------------------------------------------------------------------------------------------------------------------  Chemistries  Recent Labs  Lab 08/18/22 0135  NA 143  K 4.3  CL 109  CO2 27  GLUCOSE 132*  BUN 25*  CREATININE 0.84  CALCIUM 8.8*  AST 25  ALT 24  ALKPHOS 251*  BILITOT 0.4   ------------------------------------------------------------------------------------------------------------------  Cardiac Enzymes No results for input(s): "TROPONINI" in the last 168 hours. ------------------------------------------------------------------------------------------------------------------  RADIOLOGY:  DG Chest Port 1 View  Result Date: 08/18/2022 CLINICAL DATA:  Shortness of breath and cough. EXAM: PORTABLE CHEST 1 VIEW COMPARISON:  Twenty-third 2024 FINDINGS: The cardiac silhouette is mildly enlarged and unchanged in size. Mild prominence of the perihilar pulmonary vasculature is seen. Low lung volumes are noted. There is no evidence of focal consolidation, pleural effusion or pneumothorax. A large hiatal hernia is suspected. Stable  postoperative changes are seen throughout the thoracic spine. IMPRESSION: 1. Mild cardiomegaly and mild pulmonary vascular congestion. 2. Large gastric hernia. Electronically Signed   By: Aram Candela M.D.   On: 08/18/2022 01:55      IMPRESSION AND PLAN:  Assessment and Plan: * Sepsis due to gram-negative UTI (HCC) - This is manifested by tachycardia and tachypnea. - She could be meeting severe sepsis criteria given hypoxemia elevated lactic acid of 4.3 which has rapidly resolved though within couple of hours.  I doubt severe sepsis though given procalcitonin of less than 0.1. - She will be admitted to a progressive unit bed. - We will continue antibiotic therapy for now with IV Rocephin. - Will be cautious with hydration especially given her pulmonary vascular congestion.  Acute respiratory failure with hypoxia (HCC) - This seems to be more obvious with cough. - Given her chest x-ray findings we will place her on diuresis with IV Lasix. - We will obtain 2D echo for further assessment for the possibility of new knee developing acute CHF. - We will hold off for now on cardiology consult pending echo results. - She will also be placed on DuoNebs as acute bronchitis with bronchospasm could be in the differential diagnosis.  This should be covered with IV Rocephin.  Seizure disorder (HCC) - We will can continue her Tegretol XR.  Cerebral palsy (HCC) - This associated with mental retardation and behavioral changes and anxiety. - We will continue Zyprexa and hydroxyzine as well as Ativan.  Hypothyroidism - We will continue Synthroid.   DVT prophylaxis: Lovenox.  Advanced Care Planning:  Code Status: DNR and DNI. Family Communication:  The plan of care was discussed in details with the patient (and family). I answered all questions. The patient agreed to proceed with the above mentioned plan. Further management will depend upon hospital course. Disposition Plan: Back to previous home  environment Consults called: none.  All the records are reviewed and case discussed with ED provider.  Status is: Inpatient  At the time of the admission, it appears that the appropriate admission status for this patient is inpatient.  This is judged to be reasonable and necessary in order to provide the required intensity of service to ensure the patient's safety given the presenting symptoms, physical exam findings and initial radiographic and laboratory data in the context  of comorbid conditions.  The patient requires inpatient status due to high intensity of service, high risk of further deterioration and high frequency of surveillance required.  I certify that at the time of admission, it is my clinical judgment that the patient will require inpatient hospital care extending more than 2 midnights.                            Dispo: The patient is from: Home              Anticipated d/c is to: Home              Patient currently is not medically stable to d/c.              Difficult to place patient: No  Hannah Beat M.D on 08/18/2022 at 4:51 AM  [facility]   From 7 PM-7 AM, contact night-coverage [email or web address]  CC: Primary care physician; Lauro Regulus, MD

## 2022-08-18 NOTE — Progress Notes (Signed)
Courtesy note- no billing  Patient is seen and examined today morning. She is admitted for sepsis possible UTI. She is poor historian unable to provide details. Echo tech at bedside discussed with me regarding difficulty to obtain echo on her due to her mental status. She will be continued on sepsis protocol, IV hydration, IV antibiotics, low dose lasix. She is DNR. Monitor vitals closely. Further management as per clinical course.

## 2022-08-18 NOTE — Assessment & Plan Note (Addendum)
-   This seems to be more obvious with cough. - Given her chest x-ray findings we will place her on diuresis with IV Lasix. - We will obtain 2D echo for further assessment for the possibility of new knee developing acute CHF. - We will hold off for now on cardiology consult pending echo results. - She will also be placed on DuoNebs as acute bronchitis with bronchospasm could be in the differential diagnosis.  This should be covered with IV Rocephin.

## 2022-08-18 NOTE — ED Provider Notes (Signed)
[facility] Provider Note    Event Date/Time   First MD Initiated Contact with Patient 08/18/22 [phone]     (approximate)   History   Cough  Level 5 caveat:  history/ROS limited by acute/critical illness  HPI [name] is a 57 y.o. female with severe chronic MR and multiple chronic medical issues.  She is brought by EMS from her group home for evaluation of wheezing and low oxygen level at home.  They report that she has been wheezing and coughing and that her oxygen level has been in the low 80s.  The patient reportedly is at her baseline mental status which is combative with any attempt at intervention.  She was biting and scratching at the paramedics when they tried to put her on oxygen and establish an IV.  The group home confirmed that this is her normal behavior.  However the paramedics also confirmed that her oxygen level would drop when she would have coughing episodes and they saw her oxygenation down in the 80s.      Physical Exam   Triage Vital Signs: ED Triage Vitals  Encounter Vitals Group     BP 08/18/22 0143 (!) 158/95     Systolic BP Percentile --      Diastolic BP Percentile --      Pulse Rate 08/18/22 0115 (!) 127     Resp 08/18/22 0115 (!) 21     Temp 08/18/22 0143 98.5 F (36.9 C)     Temp Source 08/18/22 0143 Rectal     SpO2 08/18/22 0115 91 %     Weight 08/18/22 0550 51.2 kg (112 lb 14 oz)     Height --      Head Circumference --      Peak Flow --      Pain Score --      Pain Loc --      Pain Education --      Exclude from Growth Chart --     Most recent vital signs: Vitals:   08/18/22 0330 08/18/22 0457  BP: (!) 155/93 (!) 151/87  Pulse: 90 76  Resp: (!) 23 18  Temp:    SpO2: 100% 93%    General: Awake, alert, obviously with extensive chronic issues but only in distress when agitated by external stimuli. CV:  Good peripheral perfusion.  Tachycardia, normal heart sounds.  Regular rhythm. Resp:  Patient has  some wheezing and coarse breath sounds but difficult to appreciate due to her combativeness upon auscultation.  Some mild tachypnea. Abd:  No distention.  Difficult to appreciate the exam as the patient guards with any attempts to perform the examination.   ED Results / Procedures / Treatments   Labs (all labs ordered are listed, but only abnormal results are displayed) Labs Reviewed  LACTIC ACID, PLASMA - Abnormal; Notable for the following components:      Result Value   Lactic Acid, Venous 4.3 (*)    All other components within normal limits  COMPREHENSIVE METABOLIC PANEL - Abnormal; Notable for the following components:   Glucose, Bld 132 (*)    BUN 25 (*)    Calcium 8.8 (*)    Albumin 3.4 (*)    Alkaline Phosphatase 251 (*)    All other components within normal limits  URINALYSIS, W/ REFLEX TO CULTURE (INFECTION SUSPECTED) - Abnormal; Notable for the following components:   Color, Urine YELLOW (*)    APPearance HAZY (*)    Leukocytes,Ua  SMALL (*)    Bacteria, UA MANY (*)    All other components within normal limits  BASIC METABOLIC PANEL - Abnormal; Notable for the following components:   Glucose, Bld 102 (*)    BUN 23 (*)    Calcium 8.6 (*)    All other components within normal limits  CBC - Abnormal; Notable for the following components:   Platelets 131 (*)    All other components within normal limits  CULTURE, BLOOD (ROUTINE X 2)  CULTURE, BLOOD (ROUTINE X 2)  SARS CORONAVIRUS 2 BY RT PCR  URINE CULTURE  LACTIC ACID, PLASMA  CBC WITH DIFFERENTIAL/PLATELET  PROTIME-INR  BRAIN NATRIURETIC PEPTIDE  LIPASE, BLOOD  PROCALCITONIN  PROTIME-INR  CORTISOL-AM, BLOOD  PROCALCITONIN     EKG  ED ECG REPORT I, Loleta Rose, the attending physician, personally viewed and interpreted this ECG.  Date: 08/18/2022 EKG Time: 1:20 AM Rate: 134 Rhythm: Sinus tachycardia QRS Axis: normal Intervals: normal ST/T Wave abnormalities: Non-specific ST segment / T-wave changes,  but no clear evidence of acute ischemia. Narrative Interpretation: no definitive evidence of acute ischemia; does not meet STEMI criteria.    RADIOLOGY I viewed and interpreted the patient's chest x-ray which is suggestive of pulmonary vascular congestion.  Radiology report confirms.   PROCEDURES:  Critical Care performed: Yes, see critical care procedure note(s)  .1-3 Lead EKG Interpretation  Performed by: Loleta Rose, MD Authorized by: Loleta Rose, MD     Interpretation: abnormal     ECG rate:  120   ECG rate assessment: tachycardic     Rhythm: sinus tachycardia     Ectopy: none   .Critical Care  Performed by: Loleta Rose, MD Authorized by: Loleta Rose, MD   Critical care provider statement:    Critical care time (minutes):  30   Critical care time was exclusive of:  Separately billable procedures and treating other patients   Critical care was necessary to treat or prevent imminent or life-threatening deterioration of the following conditions:  Respiratory failure   Critical care was time spent personally by me on the following activities:  Development of treatment plan with patient or surrogate, evaluation of patient's response to treatment, examination of patient, obtaining history from patient or surrogate, ordering and performing treatments and interventions, ordering and review of laboratory studies, ordering and review of radiographic studies, pulse oximetry, re-evaluation of patient's condition and review of old charts     IMPRESSION / MDM / ASSESSMENT AND PLAN / ED COURSE  I reviewed the triage vital signs and the nursing notes.                              Differential diagnosis includes, but is not limited to, sepsis, heart failure, previously unidentified injury.  Patient's presentation is most consistent with acute presentation with potential threat to life or bodily function.  Labs/studies ordered: BMP, pro time-INR, procalcitonin, lactic acid,  COVID PCR, blood cultures x 2, urine culture, urinalysis, lipase, CMP, CBC with differential, chest x-ray, EKG  Interventions/Medications given:  Medications  aspirin EC tablet 325 mg (has no administration in time range)  hydrOXYzine (ATARAX) tablet 25 mg (has no administration in time range)  LORazepam (ATIVAN) tablet 1 mg (has no administration in time range)  mirtazapine (REMERON) tablet 15 mg (has no administration in time range)  OLANZapine (ZYPREXA) tablet 10 mg (has no administration in time range)  levothyroxine (SYNTHROID) tablet 75 mcg (75 mcg Oral  Given 08/18/22 0629)  dicyclomine (BENTYL) capsule 10 mg (has no administration in time range)  docusate sodium (COLACE) capsule 100 mg (has no administration in time range)  metoCLOPramide (REGLAN) tablet 5 mg (has no administration in time range)  pantoprazole (PROTONIX) EC tablet 40 mg (has no administration in time range)  sucralfate (CARAFATE) 1 GM/10ML suspension 1 g (has no administration in time range)  tamsulosin (FLOMAX) capsule 0.4 mg (has no administration in time range)  iron polysaccharides (NIFEREX) capsule 150 mg (has no administration in time range)  baclofen (LIORESAL) tablet 20 mg (has no administration in time range)  carbamazepine (TEGRETOL XR) 12 hr tablet 200 mg (has no administration in time range)  loratadine (CLARITIN) tablet 10 mg (has no administration in time range)  multivitamin with minerals tablet 1 tablet (has no administration in time range)  Sulfacetamide Sodium (Acne) 10 % LOTN (has no administration in time range)  ketotifen (ZADITOR) 0.035 % ophthalmic solution 1 drop (has no administration in time range)  enoxaparin (LOVENOX) injection 40 mg (has no administration in time range)  furosemide (LASIX) injection 40 mg (has no administration in time range)  0.9 %  sodium chloride infusion ( Intravenous Infusion Verify 08/18/22 0700)  cefTRIAXone (ROCEPHIN) 2 g in sodium chloride 0.9 % 100 mL IVPB (has no  administration in time range)  haloperidol lactate (HALDOL) injection 1 mg (has no administration in time range)  ipratropium-albuterol (DUONEB) 0.5-2.5 (3) MG/3ML nebulizer solution 3 mL (has no administration in time range)  guaiFENesin (MUCINEX) 12 hr tablet 600 mg (has no administration in time range)  chlorpheniramine-HYDROcodone (TUSSIONEX) 10-8 MG/5ML suspension 5 mL (has no administration in time range)  cefTRIAXone (ROCEPHIN) 1 g in sodium chloride 0.9 % 100 mL IVPB (0 g Intravenous Stopped 08/18/22 0512)  cefTRIAXone (ROCEPHIN) 1 g in sodium chloride 0.9 % 100 mL IVPB (0 g Intravenous Stopped 08/18/22 0658)    (Note:  hospital course my include additional interventions and/or labs/studies not listed above.)   I am initiating a "possible sepsis" workup on this patient.  Differential is broad and she cannot provide any history.  She is hypoxemic particular when she has a coughing episode and has some coarse breath sounds, unclear if this is chronic.  Chest x-ray is suggestive of pulmonary vascular congestion rather than pneumonia and her initial evaluation is reassuring with no fever.  Her heart rate normalized to below 90 without any intervention when she was no longer being agitated or stimulated our staff.  I will hold off on empiric antibiotics until additional evaluation  The patient is on the cardiac monitor to evaluate for evidence of arrhythmia and/or significant heart rate changes.   Clinical Course as of 08/18/22 0734  Wynelle Link Aug 18, 2022  9629 Lactic Acid, Venous(!!): 4.3 The patient is noted to have a lactate>4. With the current information available to me, I don't think the patient is in septic shock. The lactate>4, is related to respiratory distress/ respiratory failure  [CF]  [phone] I believe that the patient's elevated lactic acid is due to acute respiratory failure with hypoxemia in the setting of pulmonary vascular congestion.  I see no evidence that the patient has an  existing diagnosis of congestive heart failure or his had an echocardiogram in the past.  Given her high risk of possible community-acquired pneumonia, despite an overall reassuring infectious workup including no leukocytosis and no fever, I will consider treating empirically with ceftriaxone and azithromycin as listed above.  However, I think this is  primarily an issue of volume and I will give her a small dose of furosemide to begin diuresis.  I am consulting the hospitalist for admission and will explain the situation and we will decide together whether to treat with antibiotics [CF]  0352 Urinalysis, w/ Reflex to Culture (Infection Suspected) -Urine, Clean Catch(!) Urinalysis consistent with UTI given many bacteria and small leukocytes.  Culture pending, but it was not in and out catheterization and this is likely true positive.  Treating with ceftriaxone 1 g IV. [CF]  0405 Consulted with Dr. Arville Care with the hospitalist service who agrees with the plan and will admit.  He also agrees with the plan for empiric antibiotics for the urinary tract infection but not for sepsis given that the patient does not meet sepsis criteria.  Her initial heart rate of greater than 100 was likely due to her agitation and has normalized substantially after arriving in the emergency department. [CF]  0734 Procalcitonin: <0.10 [CF]    Clinical Course User Index [CF] Loleta Rose, MD     FINAL CLINICAL IMPRESSION(S) / ED DIAGNOSES   Final diagnoses:  Acute respiratory failure with hypoxemia (HCC)     Rx / DC Orders   ED Discharge Orders     None        Note:  This document was prepared using Dragon voice recognition software and may include unintentional dictation errors.   Loleta Rose, MD 08/18/22 [phone]

## 2022-08-18 NOTE — Assessment & Plan Note (Signed)
-   We will can continue her Tegretol XR.

## 2022-08-18 NOTE — Assessment & Plan Note (Signed)
-   This is manifested by tachycardia and tachypnea. - She could be meeting severe sepsis criteria given hypoxemia elevated lactic acid of 4.3 which has rapidly resolved though within couple of hours.  I doubt severe sepsis though given procalcitonin of less than 0.1. - She will be admitted to a progressive unit bed. - We will continue antibiotic therapy for now with IV Rocephin. - Will be cautious with hydration especially given her pulmonary vascular congestion.

## 2022-08-18 NOTE — Assessment & Plan Note (Addendum)
-   This associated with mental retardation and behavioral changes and anxiety. - We will continue Zyprexa and hydroxyzine as well as Ativan.

## 2022-08-18 NOTE — Assessment & Plan Note (Signed)
-   We will continue Synthroid. 

## 2022-08-18 NOTE — Plan of Care (Signed)
  Problem: Respiratory: Goal: Ability to maintain adequate ventilation will improve Outcome: Progressing   Problem: Clinical Measurements: Goal: Will remain free from infection Outcome: Progressing Goal: Diagnostic test results will improve Outcome: Progressing Goal: Respiratory complications will improve Outcome: Progressing   Problem: Education: Goal: Knowledge of General Education information will improve Description: Including pain rating scale, medication(s)/side effects and non-pharmacologic comfort measures Outcome: Not Met (add Reason) Note: Nonverbal

## 2022-08-18 NOTE — Progress Notes (Signed)
Unable to perform Echo. Patient was combative and trying to bite sonographer.   Dondra Prader RVT RCS

## 2022-08-18 NOTE — ED Triage Notes (Signed)
Pt BIB EMS from a facility.  Pt is non-verbal and based on EMS' report, staff stated that the pt was SOB and coughing. Pt does not appear that way, she is fighting, biting and is at baseline, according to EMS.

## 2022-08-19 ENCOUNTER — Inpatient Hospital Stay: Admit: 2022-08-19 | Payer: Medicare Other

## 2022-08-19 DIAGNOSIS — N390 Urinary tract infection, site not specified: Secondary | ICD-10-CM | POA: Diagnosis not present

## 2022-08-19 DIAGNOSIS — G808 Other cerebral palsy: Secondary | ICD-10-CM | POA: Diagnosis not present

## 2022-08-19 DIAGNOSIS — I5031 Acute diastolic (congestive) heart failure: Secondary | ICD-10-CM

## 2022-08-19 DIAGNOSIS — A4150 Gram-negative sepsis, unspecified: Secondary | ICD-10-CM | POA: Diagnosis not present

## 2022-08-19 DIAGNOSIS — J9601 Acute respiratory failure with hypoxia: Secondary | ICD-10-CM | POA: Diagnosis not present

## 2022-08-19 DIAGNOSIS — B9689 Other specified bacterial agents as the cause of diseases classified elsewhere: Secondary | ICD-10-CM

## 2022-08-19 NOTE — Progress Notes (Signed)
*  PRELIMINARY RESULTS* Echocardiogram 2D Echocardiogram has been performed.  Cristela Blue 08/19/2022, 9:22 AM

## 2022-08-19 NOTE — Progress Notes (Signed)
Chap rounding on the unit visited Pt at bedside. Pt is asleep for now. No needs accessed.   08/19/22 1300  Spiritual Encounters  Type of Visit Initial  Care provided to: Pt not available  Referral source Chaplain assessment  Reason for visit Routine spiritual support  OnCall Visit No

## 2022-08-19 NOTE — Plan of Care (Signed)
  Problem: Clinical Measurements: Goal: Diagnostic test results will improve Outcome: Progressing Goal: Signs and symptoms of infection will decrease Outcome: Progressing   Problem: Respiratory: Goal: Ability to maintain adequate ventilation will improve Outcome: Progressing   

## 2022-08-19 NOTE — Progress Notes (Signed)
Progress Note   Patient: [name] ZDG:[id number] DOB: [birth year] DOA: 08/18/2022     1 DOS: the patient was seen and examined on 08/19/2022   Brief hospital course: [name] is a 57 y.o. female with medical history significant for mental retardation, seizure disorder, urolithiasis, and cerebral palsy, who presented to the emergency room with acute onset of cough with associated wheezing and no pulse extremity that was down to the low 80s especially with coughing.  In the ER she has been satting in the low 90s.  She has been slightly restless and combative in the ER.  No reported fever or chills.  No reported nausea or vomiting.  No other history could be obtained from the patient due to her advanced mental retardation.  Patient is admitted to the hospitalist service for further management evaluation of sepsis of urologic origin.  Patient is started on Rocephin therapy.  Patient's urine cultures grew Klebsiella.  Assessment and Plan: * Sepsis due to gram-negative UTI (HCC) Temperature -borderline low.  Tachypnea persists. Urine cultures came back positive for Klebsiella. Prior recurrent UTI noted. Need urology eval as outpatient. Continue IV Rocephin therapy. Follow culture sensitivities. Encourage oral diet, supplements.  Acute respiratory failure with hypoxia (HCC) In the setting of sepsis. Caution with IV fluids, watch for fluid overload. She is currently on room air. Echocardiogram is unremarkable. Hold off diuretic therapy at this time. Continue antitussives, bronchodilator therapy.  Seizure disorder (HCC) Severe Mental retardation Cerebral palsy (HCC) Continue Zyprexa and hydroxyzine as well as Ativan. Continue Tegretol therapy.  Hypothyroidism Continue Home dose Synthroid.  Continue supportive care. DVT prophylaxis-Lovenox CODE STATUS DNR.      Subjective: Patient is seen and examined today morning.  She is a poor historian given her mental evaluation,  occasional grunts, unable to obtain history. Has bleeding while cleaning after bowel movement. Temp borderline low, started warming blanket.  Physical Exam: Vitals:   08/19/22 0424 08/19/22 0729 08/19/22 1000 08/19/22 1301  BP:  (!) 160/85  119/75  Pulse:  66  83  Resp:  (!) 22 18   Temp:  (!) 96.6 F (35.9 C) 97.9 F (36.6 C)   TempSrc:   Oral   SpO2:  95%  93%  Weight: 47.3 kg      General - Middle aged general Caucasian female, no apparent distress HEENT - PERRLA, EOMI, atraumatic head, non tender sinuses. Lung - Clear, diffuse rales Heart - S1, S2 heard, no murmurs, rubs, no pedal edema Neuro - Alert, does not follow commands, unable to do full neuroexam Skin - Warm and dry.  Significant contractures of extremities Data Reviewed:     Latest Ref Rng & Units 08/18/2022    5:12 AM 08/18/2022    1:35 AM 05/28/2022    7:22 AM  CBC  WBC 4.0 - 10.5 K/uL 4.4  6.6  2.8   Hemoglobin 12.0 - 15.0 g/dL 59.5  63.8  75.6   Hematocrit 36.0 - 46.0 % 38.4  40.8  38.3   Platelets 150 - 400 K/uL 131  157  120        Latest Ref Rng & Units 08/18/2022    5:12 AM 08/18/2022    1:35 AM 05/28/2022    7:22 AM  BMP  Glucose 70 - 99 mg/dL 433  295  89   BUN 6 - 20 mg/dL 23  25  26    Creatinine 0.44 - 1.00 mg/dL 1.88  4.16  6.06   Sodium 135 -  145 mmol/L 141  143  142   Potassium 3.5 - 5.1 mmol/L 4.3  4.3  3.5   Chloride 98 - 111 mmol/L 108  109  105   CO2 22 - 32 mmol/L 28  27  30    Calcium 8.9 - 10.3 mg/dL 8.6  8.8  8.7     Family Communication: Mother notified over phone regarding her current care plan. She asked about recurrent UTI, advised urology eval outpatient.  Disposition: Status is: Inpatient Remains inpatient appropriate because: IV antibiotics,  Planned Discharge Destination: Skilled nursing facility    Time spent: 45 minutes  Author: Marcelino Duster, MD 08/19/2022 3:42 PM  For on call review [email or web address].

## 2022-08-19 NOTE — Progress Notes (Signed)
*  PRELIMINARY RESULTS* Echocardiogram 2D Echocardiogram has been performed.  [name] 08/19/2022, 8:38 AM

## 2022-08-19 NOTE — TOC Initial Note (Signed)
Transition of Care [facility]) - Initial/Assessment Note    Patient Details  Name: [name] MRN: [id number] Date of Birth: [birth year]  [facility] CM/SW Contact:    Truddie Hidden, RN Phone Number: 08/19/2022, 10:25 AM  Clinical Narrative:                 Patient admits from [facility].  TOC assessing for ongoing needs and discharge planning.        Patient Goals and CMS Choice            Expected Discharge Plan and Services                                              Prior Living Arrangements/Services                       Activities of Daily Living      Permission Sought/Granted                  Emotional Assessment              Admission diagnosis:  Sepsis due to gram-negative UTI (HCC) [A41.50, N39.0] Patient Active Problem List   Diagnosis Date Noted   Sepsis due to gram-negative UTI (HCC) 08/18/2022   Acute respiratory failure with hypoxia (HCC) 08/18/2022   COVID-19 virus infection 05/24/2022   Cerebral palsy (HCC) 05/24/2022   Seizure disorder (HCC) 05/24/2022   UTI (urinary tract infection) 05/24/2022   Thrombocytopenia (HCC) 05/24/2022   Nausea & vomiting 05/24/2022   Hypothyroidism 05/24/2022   Pleural effusion on left 05/24/2022   Liver lesion 05/24/2022   Mental retardation 02/21/2015   Seizure disorder, primary (HCC) 02/21/2015   Menopause 02/21/2015   Abnormal liver function tests 02/21/2015   PCP:  Lauro Regulus, MD Pharmacy:   [facility] - [place], [place] - [address] [place] [address] Phone: [phone] Fax: [phone]     Social Determinants of Health (SDOH) Social History: SDOH Screenings   Food Insecurity: Patient Unable To Answer (08/06/2022)   Received from [facility]  Transportation Needs: Patient Unable To Answer (08/06/2022)   Received from [facility]  Utilities: Patient Unable To Answer  (08/06/2022)   Received from [facility]  Financial Resource Strain: Patient Unable To Answer (08/06/2022)   Received from [facility]  Tobacco Use: Low Risk  (08/18/2022)   SDOH Interventions:     Readmission Risk Interventions     No data to display

## 2022-08-20 DIAGNOSIS — A4150 Gram-negative sepsis, unspecified: Secondary | ICD-10-CM | POA: Diagnosis not present

## 2022-08-20 DIAGNOSIS — N390 Urinary tract infection, site not specified: Secondary | ICD-10-CM | POA: Diagnosis not present

## 2022-08-20 DIAGNOSIS — J9601 Acute respiratory failure with hypoxia: Secondary | ICD-10-CM | POA: Diagnosis not present

## 2022-08-20 DIAGNOSIS — G808 Other cerebral palsy: Secondary | ICD-10-CM | POA: Diagnosis not present

## 2022-08-20 LAB — GLUCOSE, CAPILLARY
Glucose-Capillary: 88 mg/dL (ref 70–99)
Glucose-Capillary: 80 mg/dL (ref 70–99)
Glucose-Capillary: 104 mg/dL — ABNORMAL HIGH (ref 70–99)

## 2022-08-20 MED ORDER — CEFAZOLIN SODIUM-DEXTROSE 1-4 GM/50ML-% IV SOLN
1.0000 g | Freq: Three times a day (TID) | INTRAVENOUS | Status: DC
  Administered 2022-08-21 – 2022-08-22 (×4): 1 g via INTRAVENOUS
  Filled 2022-08-20 (×5): qty 50

## 2022-08-20 NOTE — Progress Notes (Signed)
  Progress Note   Date: 08/19/2022  Patient Name: [name]        MRN#: [id number]  Review the patient's clinical findings supports the diagnosis of:   Acute lactic Acidosis present on admission

## 2022-08-20 NOTE — Plan of Care (Signed)
  Problem: Clinical Measurements: Goal: Diagnostic test results will improve Outcome: Progressing Goal: Signs and symptoms of infection will decrease Outcome: Progressing   Problem: Respiratory: Goal: Ability to maintain adequate ventilation will improve Outcome: Progressing   

## 2022-08-20 NOTE — Progress Notes (Signed)
Progress Note   Patient: [name] [id number] DOB: 25-Mar-1965 DOA: 08/18/2022     2 DOS: the patient was seen and examined on 08/20/2022   Brief hospital course: [name] is a 57 y.o. female with medical history significant for mental retardation, seizure disorder, urolithiasis, and cerebral palsy, who presented to the emergency room with acute onset of cough with associated wheezing and no pulse extremity that was down to the low 80s especially with coughing.  In the ER she has been satting in the low 90s.  She has been slightly restless and combative in the ER.  No reported fever or chills.  No reported nausea or vomiting.  No other history could be obtained from the patient due to her advanced mental retardation.  Patient is admitted to the hospitalist service for further management evaluation of sepsis of urologic origin.  Patient is started on Rocephin therapy.  Patient's urine cultures grew Klebsiella.  Assessment and Plan: * Sepsis due to gram-negative UTI (HCC) Temperature -borderline low.  Tachypnea persists. Lactic acid improved. Urine cultures came back positive for Klebsiella. IV Rocephin therapy changes to Ancef to finish 5 day course. Encourage oral diet, supplements.  Acute respiratory failure with hypoxia (HCC) In the setting of sepsis. Caution with IV fluids, watch for fluid overload. She is currently on room air. Echocardiogram is unremarkable. Hold off diuretic therapy at this time. Continue antitussives, bronchodilator therapy.  Seizure disorder (HCC) Severe Mental retardation Cerebral palsy (HCC) Continue Zyprexa and hydroxyzine as well as Ativan. Continue Tegretol therapy.  Hypothyroidism Continue Home dose Synthroid.  Continue supportive care. DVT prophylaxis-Lovenox CODE STATUS DNR.      Subjective: Patient is seen and examined today morning.  She is a poor historian given her mental evaluation, unable to provide history.  No overnight  issues.  Physical Exam: Vitals:   08/19/22 2339 08/20/22 0404 08/20/22 0820 08/20/22 1150  BP: 125/75 117/60 (!) 141/77 (!) 140/90  Pulse: 88 65 71 83  Resp: 20 18 16 16   Temp: 98.7 F (37.1 C) 97.7 F (36.5 C) 97.8 F (36.6 C) 97.9 F (36.6 C)  TempSrc:      SpO2: 92% 95%    Weight:  51.9 kg     General - Middle aged general Caucasian female, no apparent distress HEENT - PERRLA, EOMI, atraumatic head, non tender sinuses. Lung - Clear, diffuse rales Heart - S1, S2 heard, no murmurs, rubs, no pedal edema Neuro - Alert, does not follow commands, grunts, unable to do full neuro exam Skin - Warm and dry.  Significant contractures of extremities Data Reviewed:     Latest Ref Rng & Units 08/18/2022    5:12 AM 08/18/2022    1:35 AM 05/28/2022    7:22 AM  CBC  WBC 4.0 - 10.5 K/uL 4.4  6.6  2.8   Hemoglobin 12.0 - 15.0 g/dL 27.2  53.6  64.4   Hematocrit 36.0 - 46.0 % 38.4  40.8  38.3   Platelets 150 - 400 K/uL 131  157  120        Latest Ref Rng & Units 08/18/2022    5:12 AM 08/18/2022    1:35 AM 05/28/2022    7:22 AM  BMP  Glucose 70 - 99 mg/dL 034  742  89   BUN 6 - 20 mg/dL 23  25  26    Creatinine 0.44 - 1.00 mg/dL 5.95  6.38  7.56   Sodium 135 - 145 mmol/L 141  143  142   Potassium 3.5 - 5.1 mmol/L 4.3  4.3  3.5   Chloride 98 - 111 mmol/L 108  109  105   CO2 22 - 32 mmol/L 28  27  30    Calcium 8.9 - 10.3 mg/dL 8.6  8.8  8.7     Family Communication: Mother notified over phone yesterday regarding her current care plan. She asked about recurrent UTI, advised urology eval outpatient.  Disposition: Status is: Inpatient Remains inpatient appropriate because: IV antibiotic therapy.  Planned Discharge Destination: Skilled nursing facility    Time spent: 43 minutes  Author: Marcelino Duster, MD 08/20/2022 2:49 PM  For on call review [email or web address].

## 2022-08-21 DIAGNOSIS — N390 Urinary tract infection, site not specified: Secondary | ICD-10-CM | POA: Diagnosis not present

## 2022-08-21 DIAGNOSIS — J9601 Acute respiratory failure with hypoxia: Secondary | ICD-10-CM | POA: Diagnosis not present

## 2022-08-21 DIAGNOSIS — A4150 Gram-negative sepsis, unspecified: Secondary | ICD-10-CM | POA: Diagnosis not present

## 2022-08-21 DIAGNOSIS — G808 Other cerebral palsy: Secondary | ICD-10-CM | POA: Diagnosis not present

## 2022-08-21 NOTE — Care Management Important Message (Signed)
Important Message  Patient Details  Name: [name] MRN: [id number] Date of Birth: 1965/05/13   Medicare Important Message Given:  N/A - LOS <3 / Initial given by admissions     [name] A Iveth Heidemann 08/21/2022, 8:50 AM

## 2022-08-21 NOTE — Plan of Care (Signed)
  Problem: Fluid Volume: Goal: Hemodynamic stability will improve Outcome: Progressing   Problem: Clinical Measurements: Goal: Diagnostic test results will improve Outcome: Progressing Goal: Signs and symptoms of infection will decrease Outcome: Progressing   Problem: Respiratory: Goal: Ability to maintain adequate ventilation will improve Outcome: Progressing   Problem: Clinical Measurements: Goal: Ability to maintain clinical measurements within normal limits will improve Outcome: Progressing Goal: Will remain free from infection Outcome: Progressing Goal: Diagnostic test results will improve Outcome: Progressing Goal: Respiratory complications will improve Outcome: Progressing Goal: Cardiovascular complication will be avoided Outcome: Progressing   Problem: Activity: Goal: Risk for activity intolerance will decrease Outcome: Progressing   Problem: Nutrition: Goal: Adequate nutrition will be maintained Outcome: Progressing   Problem: Coping: Goal: Level of anxiety will decrease Outcome: Progressing   Problem: Elimination: Goal: Will not experience complications related to bowel motility Outcome: Progressing Goal: Will not experience complications related to urinary retention Outcome: Progressing   Problem: Pain Managment: Goal: General experience of comfort will improve Outcome: Progressing   Problem: Safety: Goal: Ability to remain free from injury will improve Outcome: Progressing   Problem: Skin Integrity: Goal: Risk for impaired skin integrity will decrease Outcome: Progressing   Problem: Education: Goal: Knowledge of General Education information will improve Description: Including pain rating scale, medication(s)/side effects and non-pharmacologic comfort measures Outcome: Not Progressing Note: Patient has cognitive disabilities. Will continue to educate as needed.   Problem: Health Behavior/Discharge Planning: Goal: Ability to manage  health-related needs will improve Outcome: Not Progressing Note: Patient has cognitive disabilities. Will continue to educate as needed.

## 2022-08-21 NOTE — Progress Notes (Signed)
Progress Note   Patient: [name] HQI:[id number] DOB: September 10, 1965 DOA: 08/18/2022     3 DOS: the patient was seen and examined on 08/21/2022   Brief hospital course: [name] is a 57 y.o. female with medical history significant for mental retardation, seizure disorder, urolithiasis, and cerebral palsy, who presented to the emergency room with acute onset of cough with associated wheezing and no pulse extremity that was down to the low 80s especially with coughing.  In the ER she has been satting in the low 90s.  She has been slightly restless and combative in the ER.  No reported fever or chills.  No reported nausea or vomiting.  No other history could be obtained from the patient due to her advanced mental retardation.  Patient is admitted to the hospitalist service for further management evaluation of sepsis of urologic origin.  Patient is started on Rocephin therapy.  Patient's urine cultures grew Klebsiella.  Assessment and Plan: * Sepsis due to gram-negative UTI (HCC) Klebsiella UTI Temperature -borderline low.  Tachypnea persists. Lactic acid improved. IV Rocephin therapy changes to Ancef to finish 5 day course. Feed with assistance. Encourage oral diet, supplements.  Acute respiratory failure with hypoxia (HCC) In the setting of sepsis. Caution with IV fluids, watch for fluid overload. She is currently on room air. Echocardiogram is unremarkable. Hold off diuretic therapy at this time. Continue antitussives, bronchodilator therapy.  Seizure disorder (HCC) Severe Mental retardation Cerebral palsy (HCC) Continue Zyprexa and hydroxyzine as well as Ativan. Continue Tegretol therapy.  Hypothyroidism Continue Home dose Synthroid.  Continue supportive care. DVT prophylaxis-Lovenox CODE STATUS DNR.      Subjective: Patient is seen and examined today morning.  She opens eyes spontaneously, RN at bedside providing morning meds. No overnight issues. Able to tolerate  dysphagia diet 1.  Physical Exam: Vitals:   08/21/22 0305 08/21/22 0405 08/21/22 0822 08/21/22 1204  BP: (!) 155/83  (!) 171/90 (!) 157/80  Pulse: 77  70 76  Resp: 18  16 16   Temp: (!) 97.5 F (36.4 C)  97.8 F (36.6 C) 97.8 F (36.6 C)  TempSrc: Oral     SpO2: 97%     Weight:  50.6 kg     General - Middle aged general Caucasian female, no apparent distress HEENT - PERRLA, EOMI, atraumatic head, non tender sinuses. Lung - Clear, diffuse rales Heart - S1, S2 heard, no murmurs, rubs, no pedal edema Neuro - Alert, does not follow commands, grunts, unable to do full neuro exam Skin - Warm and dry.  Significant contractures of extremities Data Reviewed:     Latest Ref Rng & Units 08/21/2022    5:05 AM 08/18/2022    5:12 AM 08/18/2022    1:35 AM  CBC  WBC 4.0 - 10.5 K/uL 3.3  4.4  6.6   Hemoglobin 12.0 - 15.0 g/dL 13.2  44.0  10.2   Hematocrit 36.0 - 46.0 % 34.7  38.4  40.8   Platelets 150 - 400 K/uL 103  131  157        Latest Ref Rng & Units 08/21/2022    5:05 AM 08/18/2022    5:12 AM 08/18/2022    1:35 AM  BMP  Glucose 70 - 99 mg/dL 98  725  366   BUN 6 - 20 mg/dL 17  23  25    Creatinine 0.44 - 1.00 mg/dL 4.40  3.47  4.25   Sodium 135 - 145 mmol/L 139  141  143  Potassium 3.5 - 5.1 mmol/L 4.3  4.3  4.3   Chloride 98 - 111 mmol/L 107  108  109   CO2 22 - 32 mmol/L 27  28  27    Calcium 8.9 - 10.3 mg/dL 8.6  8.6  8.8     Family Communication: no family at bedside. Mother will be updated regarding dc plan for tomorrow.  Disposition: Status is: Inpatient Remains inpatient appropriate because: finish 5 days of IV antibiotic therapy.  Planned Discharge Destination: Skilled nursing facility    Time spent: 44 minutes  Author: Marcelino Duster, MD 08/21/2022 3:37 PM  For on call review [email or web address].

## 2022-08-21 NOTE — Progress Notes (Addendum)
Chap visited Pray this afternoon. Pt is asleep at this time.    08/21/22 1400  Spiritual Encounters  Type of Visit Initial  Care provided to: Pt not available  Referral source Chaplain assessment  Reason for visit Routine spiritual support  OnCall Visit Yes

## 2022-08-22 DIAGNOSIS — A4150 Gram-negative sepsis, unspecified: Secondary | ICD-10-CM | POA: Diagnosis not present

## 2022-08-22 DIAGNOSIS — G809 Cerebral palsy, unspecified: Secondary | ICD-10-CM | POA: Diagnosis not present

## 2022-08-22 DIAGNOSIS — J9601 Acute respiratory failure with hypoxia: Secondary | ICD-10-CM | POA: Diagnosis not present

## 2022-08-22 DIAGNOSIS — G40909 Epilepsy, unspecified, not intractable, without status epilepticus: Secondary | ICD-10-CM | POA: Diagnosis not present

## 2022-08-22 MED ORDER — ORAL CARE MOUTH RINSE: 15.0000 mL | OROMUCOSAL | Status: DC

## 2022-08-22 MED ORDER — ORAL CARE MOUTH RINSE: 15.0000 mL | OROMUCOSAL | Status: DC | PRN

## 2022-08-22 NOTE — Discharge Summary (Addendum)
Physician Discharge Summary   Patient: [name] MRN: [id number] DOB: February 16, 1965  Admit date:     08/18/2022  Discharge date: 08/22/22  Discharge Physician: Marcelino Duster   PCP: Lauro Regulus, MD   Recommendations at discharge:    PCP follow up in 1 week.  Discharge Diagnoses: Principal Problem:   Sepsis due to gram-negative UTI [facility]) Active Problems:   Acute respiratory failure with hypoxia (HCC)   Seizure disorder (HCC)   Cerebral palsy (HCC)   Hypothyroidism  Resolved Problems:   * No resolved hospital problems. *  Hospital Course: [name] is a 57 y.o. female with medical history significant for mental retardation, seizure disorder, urolithiasis, and cerebral palsy, who presented to the emergency room with acute onset of cough with associated wheezing and no pulse extremity that was down to the low 80s especially with coughing.  In the ER she has been satting in the low 90s.  She has been slightly restless and combative in the ER.  No reported fever or chills.  No reported nausea or vomiting.  No other history could be obtained from the patient due to her advanced mental retardation.  Patient is admitted to the hospitalist service for further management evaluation of sepsis of urologic origin.  Patient is started on Rocephin therapy.  Patient's urine cultures grew Klebsiella.  Blood cultures no growth. Patient received 5 days of antibiotic therapy.  Patient remained afebrile, hemodynamically stable to be discharged back to the facility.  She will need to follow-up with PCP upon discharge as instructed.   Assessment and Plan: * Sepsis due to gram-negative UTI (HCC) Klebsiella UTI Sensitivities reviewed. Finished 5 days of Rocephin therapy.   Acute respiratory failure with hypoxia (HCC) Patient did receive 2 low-dose Lasix for pulmonary edema. Echocardiogram within normal limits. Continue antitussives, albuterol as needed   Seizure disorder  (HCC) Severe Mental retardation Cerebral palsy (HCC) Continue Zyprexa and hydroxyzine as well as Ativan. Continue Tegretol therapy.   Hypothyroidism Continue Home dose Synthroid.  Continue nursing supportive care. CODE STATUS DNR.      Consultants: none Procedures performed: none  Disposition: Skilled nursing facility Diet recommendation:  Discharge Diet Orders (From admission, onward)     Start     Ordered   08/22/22 0000  Diet general       Comments: Dysphagia 1 diet, thin liquids   08/22/22 1340           Dysphagia type 1 thin Liquid DISCHARGE MEDICATION: Allergies as of 08/22/2022       Reactions   Amantadines Nausea And Vomiting        Medication List     TAKE these medications    aspirin EC 325 MG tablet Take 325 mg by mouth daily.   azelastine 0.05 % ophthalmic solution Commonly known as: OPTIVAR Place 1 drop into both eyes 2 (two) times daily.   baclofen 20 MG tablet Commonly known as: LIORESAL Take 20 mg by mouth 3 (three) times daily.   carbamazepine 200 MG 12 hr tablet Commonly known as: TEGRETOL XR Take 200 mg by mouth 2 (two) times daily.   cetirizine 10 MG tablet Commonly known as: ZYRTEC Take 10 mg by mouth daily as needed for allergies.   dicyclomine 10 MG capsule Commonly known as: BENTYL Take 10 mg by mouth 4 (four) times daily -  before meals and at bedtime.   DSS 100 MG Caps Take 100 mg by mouth 2 (two) times daily.  eucerin cream Apply topically as needed for dry skin.   furosemide 20 MG tablet Commonly known as: LASIX Take 20 mg by mouth as needed.   hydrOXYzine 25 MG tablet Commonly known as: ATARAX Take 25 mg by mouth 3 (three) times daily.   iron polysaccharides 150 MG capsule Commonly known as: NIFEREX Take 1 capsule by mouth daily.   levothyroxine 75 MCG tablet Commonly known as: SYNTHROID Take 75 mcg by mouth daily before breakfast.   LORazepam 1 MG tablet Commonly known as: ATIVAN Take 1 mg by  mouth as needed.   metoCLOPramide 5 MG tablet Commonly known as: REGLAN Take 5 mg by mouth 4 (four) times daily as needed for nausea or vomiting.   mirtazapine 15 MG tablet Commonly known as: REMERON Take 15 mg by mouth at bedtime.   multivitamin capsule Take 1 capsule by mouth daily.   OLANZapine 5 MG tablet Commonly known as: ZYPREXA Take 5-10 mg by mouth 3 (three) times daily.   omeprazole 20 MG capsule Commonly known as: PRILOSEC Take 20 mg by mouth 2 (two) times daily.   sucralfate 1 GM/10ML suspension Commonly known as: CARAFATE Take 1 g by mouth 4 (four) times daily -  with meals and at bedtime.   Sulfacetamide Sodium (Acne) 10 % Lotn Apply topically 2 (two) times daily.   tamsulosin 0.4 MG Caps capsule Commonly known as: FLOMAX Take 0.4 mg by mouth at bedtime.   UNABLE TO FIND Med Name: CBD Oil 500mg /15mL.  1 dropper under tongue BID.        Discharge Exam: Filed Weights   08/19/22 0424 08/20/22 0404 08/21/22 0405  Weight: 47.3 kg 51.9 kg 50.6 kg   General - Middle aged general Caucasian female, no apparent distress HEENT - PERRLA, EOMI, atraumatic head, non tender sinuses. Lung - Clear, diffuse rales Heart - S1, S2 heard, no murmurs, rubs, no pedal edema Neuro - Alert, does not follow commands, grunts, unable to do full neuro exam Skin - Warm and dry.  contractures of extremities noted.  Condition at discharge: stable  The results of significant diagnostics from this hospitalization (including imaging, microbiology, ancillary and laboratory) are listed below for reference.   Imaging Studies: ECHOCARDIOGRAM COMPLETE  Result Date: 08/19/2022    ECHOCARDIOGRAM REPORT   Patient Name:   [name] Date of Exam: 08/19/2022 Medical Rec #:  [id number]        Height:       58.0 in Accession #:    [id number]       Weight:       104.3 lb Date of Birth:  1965-03-15        BSA:          1.381 m Patient Age:    56 years         BP:           123/87 mmHg  Patient Gender: F                HR:           66 bpm. Exam Location:  [facility] Procedure: 2D Echo, Cardiac Doppler and Color Doppler Indications:     CHF- acute diastolic I50.31  History:         Patient has no prior history of Echocardiogram examinations.                  Mental retardation.  Sonographer:     Cristela Blue Referring Phys:  [id number] HYQ  A MANSY Diagnosing Phys: Lorine Bears MD  Sonographer Comments: Technically challenging study due to limited acoustic windows and no subcostal window. IMPRESSIONS  1. Left ventricular ejection fraction, by estimation, is 55 to 60%. The left ventricle has normal function. The left ventricle has no regional wall motion abnormalities. Left ventricular diastolic parameters were normal.  2. Right ventricular systolic function was not well visualized. The right ventricular size is not well visualized. Tricuspid regurgitation signal is inadequate for assessing PA pressure.  3. The mitral valve is normal in structure. No evidence of mitral valve regurgitation. No evidence of mitral stenosis.  4. The aortic valve is normal in structure. Aortic valve regurgitation is not visualized. No aortic stenosis is present. FINDINGS  Left Ventricle: Left ventricular ejection fraction, by estimation, is 55 to 60%. The left ventricle has normal function. The left ventricle has no regional wall motion abnormalities. The left ventricular internal cavity size was normal in size. There is  no left ventricular hypertrophy. Left ventricular diastolic parameters were normal. Right Ventricle: The right ventricular size is not well visualized. Right vetricular wall thickness was not well visualized. Right ventricular systolic function was not well visualized. Tricuspid regurgitation signal is inadequate for assessing PA pressure. Left Atrium: Left atrial size was normal in size. Right Atrium: Right atrial size was normal in size. Pericardium: There is no evidence of pericardial effusion. Mitral Valve:  The mitral valve is normal in structure. No evidence of mitral valve regurgitation. No evidence of mitral valve stenosis. Tricuspid Valve: The tricuspid valve is not well visualized. Tricuspid valve regurgitation is not demonstrated. No evidence of tricuspid stenosis. Aortic Valve: The aortic valve is normal in structure. Aortic valve regurgitation is not visualized. No aortic stenosis is present. Aortic valve mean gradient measures 2.7 mmHg. Aortic valve peak gradient measures 5.1 mmHg. Aortic valve area, by VTI measures 2.18 cm. Pulmonic Valve: The pulmonic valve was not well visualized. Pulmonic valve regurgitation is not visualized. No evidence of pulmonic stenosis. Aorta: The aortic root is normal in size and structure. Venous: The inferior vena cava was not well visualized. IAS/Shunts: No atrial level shunt detected by color flow Doppler.  LEFT VENTRICLE PLAX 2D LVIDd:         3.60 cm   Diastology LVIDs:         2.60 cm   LV e' medial:    7.29 cm/s LV PW:         0.90 cm   LV E/e' medial:  10.6 LV IVS:        0.80 cm   LV e' lateral:   7.72 cm/s LVOT diam:     2.00 cm   LV E/e' lateral: 10.0 LV SV:         51 LV SV Index:   37 LVOT Area:     3.14 cm  RIGHT VENTRICLE RV Basal diam:  2.50 cm RV Mid diam:    2.10 cm LEFT ATRIUM             Index        RIGHT ATRIUM          Index LA diam:        2.50 cm 1.81 cm/m   RA Area:     9.02 cm LA Vol (A2C):   17.4 ml 12.60 ml/m  RA Volume:   16.90 ml 12.24 ml/m LA Vol (A4C):   35.6 ml 25.78 ml/m LA Biplane Vol: 26.8 ml 19.41 ml/m  AORTIC VALVE AV Area (  Vmax):    1.98 cm AV Area (Vmean):   1.85 cm AV Area (VTI):     2.18 cm AV Vmax:           113.00 cm/s AV Vmean:          73.433 cm/s AV VTI:            0.232 m AV Peak Grad:      5.1 mmHg AV Mean Grad:      2.7 mmHg LVOT Vmax:         71.10 cm/s LVOT Vmean:        43.200 cm/s LVOT VTI:          0.161 m LVOT/AV VTI ratio: 0.69 MITRAL VALVE MV Area (PHT): 4.12 cm    SHUNTS MV Decel Time: 184 msec    Systemic  VTI:  0.16 m MV E velocity: 77.00 cm/s  Systemic Diam: 2.00 cm MV A velocity: 87.00 cm/s MV E/A ratio:  0.89 Lorine Bears MD Electronically signed by Lorine Bears MD Signature Date/Time: 08/19/2022/11:24:20 AM    Final    DG Chest Port 1 View  Result Date: 08/18/2022 CLINICAL DATA:  Shortness of breath and cough. EXAM: PORTABLE CHEST 1 VIEW COMPARISON:  Twenty-third 2024 FINDINGS: The cardiac silhouette is mildly enlarged and unchanged in size. Mild prominence of the perihilar pulmonary vasculature is seen. Low lung volumes are noted. There is no evidence of focal consolidation, pleural effusion or pneumothorax. A large hiatal hernia is suspected. Stable postoperative changes are seen throughout the thoracic spine. IMPRESSION: 1. Mild cardiomegaly and mild pulmonary vascular congestion. 2. Large gastric hernia. Electronically Signed   By: Aram Candela M.D.   On: 08/18/2022 01:55    Microbiology: Results for orders placed or performed during the hospital encounter of 08/18/22  Blood Culture (routine x 2)     Status: None (Preliminary result)   Collection Time: 08/18/22  1:35 AM   Specimen: BLOOD  Result Value Ref Range Status   Specimen Description BLOOD  Final   Special Requests NONE  Final   Culture   Final    NO GROWTH 4 DAYS Performed at [facility], [address], [place], [place] [address]    Report Status PENDING  Incomplete  Blood Culture (routine x 2)     Status: None (Preliminary result)   Collection Time: 08/18/22  1:35 AM   Specimen: BLOOD LEFT HAND  Result Value Ref Range Status   Specimen Description BLOOD LEFT HAND  Final   Special Requests   Final    BOTTLES DRAWN AEROBIC AND ANAEROBIC Blood Culture adequate volume   Culture   Final    NO GROWTH 4 DAYS Performed at [facility], [address], [place], [place] [address]    Report Status PENDING  Incomplete  Urine Culture     Status: Abnormal   Collection Time: 08/18/22  1:35 AM    Specimen: Urine, Random  Result Value Ref Range Status   Specimen Description   Final    URINE, RANDOM Performed at [facility], [address], [place], [place] [address]    Special Requests   Final    NONE Reflexed from [id number] Performed at [facility], [address] [place], [place] [address]    Culture >=100,000 COLONIES/mL KLEBSIELLA PNEUMONIAE (A)  Final   Report Status 08/20/2022 FINAL  Final   Organism ID, Bacteria KLEBSIELLA PNEUMONIAE (A)  Final      Susceptibility  Klebsiella pneumoniae - MIC*    AMPICILLIN >=32 RESISTANT Resistant     CEFAZOLIN <=4 SENSITIVE Sensitive     CEFEPIME <=0.12 SENSITIVE Sensitive     CEFTRIAXONE <=0.25 SENSITIVE Sensitive     CIPROFLOXACIN <=0.25 SENSITIVE Sensitive     GENTAMICIN <=1 SENSITIVE Sensitive     IMIPENEM <=0.25 SENSITIVE Sensitive     NITROFURANTOIN 64 INTERMEDIATE Intermediate     TRIMETH/SULFA <=20 SENSITIVE Sensitive     AMPICILLIN/SULBACTAM 4 SENSITIVE Sensitive     PIP/TAZO <=4 SENSITIVE Sensitive     * >=100,000 COLONIES/mL KLEBSIELLA PNEUMONIAE  SARS Coronavirus 2 by RT PCR (hospital order, performed in [facility] hospital lab) *cepheid single result test* Anterior Nasal Swab     Status: None   Collection Time: 08/18/22  1:40 AM   Specimen: Anterior Nasal Swab  Result Value Ref Range Status   SARS Coronavirus 2 by RT PCR NEGATIVE NEGATIVE Final    Comment: (NOTE) SARS-CoV-2 target nucleic acids are NOT DETECTED.  The SARS-CoV-2 RNA is generally detectable in upper and lower respiratory specimens during the acute phase of infection. The lowest concentration of SARS-CoV-2 viral copies this assay can detect is 250 copies / mL. A negative result does not preclude SARS-CoV-2 infection and should not be used as the sole basis for treatment or other patient management decisions.  A negative result may occur with improper specimen collection / handling, submission of specimen other than  nasopharyngeal swab, presence of viral mutation(s) within the areas targeted by this assay, and inadequate number of viral copies (<250 copies / mL). A negative result must be combined with clinical observations, patient history, and epidemiological information.  Fact Sheet for Patients:   RoadLapTop.co.za  Fact Sheet for Healthcare Providers: http://kim-miller.com/  This test is not yet approved or  cleared by the [place] FDA and has been authorized for detection and/or diagnosis of SARS-CoV-2 by FDA under an Emergency Use Authorization (EUA).  This EUA will remain in effect (meaning this test can be used) for the duration of the COVID-19 declaration under Section 564(b)(1) of the Act, 21 U.S.C. section 360bbb-3(b)(1), unless the authorization is terminated or revoked sooner.  Performed at [facility], [address] [place], [place] [address]     Labs: CBC: Recent Labs  Lab 08/18/22 0135 08/18/22 0512 08/21/22 0505  WBC 6.6 4.4 3.3*  NEUTROABS 4.1  --   --   HGB 13.0 12.0 11.4*  HCT 40.8 38.4 34.7*  MCV 92.1 90.8 88.3  PLT 157 131* 103*   Basic Metabolic Panel: Recent Labs  Lab 08/18/22 0135 08/18/22 0512 08/21/22 0505  NA 143 141 139  K 4.3 4.3 4.3  CL 109 108 107  CO2 27 28 27   GLUCOSE 132* 102* 98  BUN 25* 23* 17  CREATININE 0.84 0.75 0.75  CALCIUM 8.8* 8.6* 8.6*   Liver Function Tests: Recent Labs  Lab 08/18/22 0135  AST 25  ALT 24  ALKPHOS 251*  BILITOT 0.4  PROT 7.2  ALBUMIN 3.4*   CBG: Recent Labs  Lab 08/20/22 0819 08/20/22 1151 08/20/22 1743  GLUCAP 88 80 104*    Discharge time spent: greater than 30 minutes.  Signed: Marcelino Duster, MD [facility] 08/22/2022

## 2022-08-22 NOTE — Progress Notes (Signed)
Chap visited Pt at bed side and found Pt awake. Pt made sigh with her hands. Mirna Mires brought a calming presence.  08/22/22 1100  Spiritual Encounters  Type of Visit Follow up  Care provided to: Patient  Referral source Chaplain assessment  Reason for visit Routine spiritual support  OnCall Visit No  Interventions  Spiritual Care Interventions Made Compassionate presence  Intervention Outcomes  Outcomes Reduced isolation;Connection to spiritual care  Spiritual Care Plan  Spiritual Care Issues Still Outstanding Lunette Stands will continue to follow

## 2022-08-22 NOTE — Progress Notes (Signed)
Patient restless throughout night. PRN given little effectiveness. Patient pulled out PIV and removed gown, covers and pillows multiple times throughout night. Patient resting in bed. Bed in lowest position. Call light within reach.

## 2022-08-22 NOTE — Plan of Care (Signed)

## 2022-08-22 NOTE — TOC Transition Note (Signed)
Transition of Care [facility]) - CM/SW Discharge Note   Patient Details  Name: [name] MRN: [id number] Date of Birth: [birth year]  Transition [facility] Care [facility]) CM/SW Contact:  Truddie Hidden, RN Phone Number: 08/22/2022, 4:15 PM   Clinical Narrative:    1:30pm Spoke with Ulyess Blossom, RSLS RN Coordinator. Lupita Leash approved patient admission for today. Lupita Leash is requesting discharge summary and FL2 be faxed to [phone].Per Lupita Leash the facility will transport the patient.   3:45pm FL2 and discharge summary faxed as requested.  4:00pm RNCM Spoke with patient's mother to advised herof discharge. She denied any questions.   TOC signing off.          Patient Goals and CMS Choice      Discharge Placement                         Discharge Plan and Services Additional resources added to the After Visit Summary for                                       Social Determinants of Health (SDOH) Interventions SDOH Screenings   Food Insecurity: Patient Unable To Answer (08/06/2022)   Received from [facility]  Transportation Needs: Patient Unable To Answer (08/06/2022)   Received from [facility]  Utilities: Patient Unable To Answer (08/06/2022)   Received from [facility]  Financial Resource Strain: Patient Unable To Answer (08/06/2022)   Received from [facility]  Tobacco Use: Low Risk  (08/18/2022)     Readmission Risk Interventions     No data to display

## 2022-08-22 NOTE — NC FL2 (Signed)
[place] MEDICAID FL2 LEVEL OF CARE FORM     IDENTIFICATION  Patient Name: [name] Birthdate: [birth year] Sex: female Admission Date (Current Location): 08/18/2022  [place] and [place] Number:  Chiropodist and Address:  [facility], [address], [place], [place] [address]      Provider Number: [id number]  Attending Physician Name and Address:  Marcelino Duster, MD  Relative Name and Phone Number:  Vella Raring (Mother)  [phone] [facility] Phone)    Current Level of Care: Hospital Recommended Level of Care: Other (Comment) (Group Home) Prior Approval Number:    Date Approved/Denied:   PASRR Number:    Discharge Plan: Other (Comment) (Group Home)    Current Diagnoses: Patient Active Problem List   Diagnosis Date Noted   Sepsis due to gram-negative UTI (HCC) 08/18/2022   Acute respiratory failure with hypoxia (HCC) 08/18/2022   COVID-19 virus infection 05/24/2022   Cerebral palsy (HCC) 05/24/2022   Seizure disorder (HCC) 05/24/2022   UTI (urinary tract infection) 05/24/2022   Thrombocytopenia (HCC) 05/24/2022   Nausea & vomiting 05/24/2022   Hypothyroidism 05/24/2022   Pleural effusion on left 05/24/2022   Liver lesion 05/24/2022   Mental retardation 02/21/2015   Seizure disorder, primary (HCC) 02/21/2015   Menopause 02/21/2015   Abnormal liver function tests 02/21/2015    Orientation RESPIRATION BLADDER Height & Weight      (Disoriented x4)  Normal External catheter, Incontinent Weight: 50.6 kg Height:     BEHAVIORAL SYMPTOMS/MOOD NEUROLOGICAL BOWEL NUTRITION STATUS    Convulsions/Seizures Incontinent Diet (DYS-1)  AMBULATORY STATUS COMMUNICATION OF NEEDS Skin   Total Care Non-Verbally Other (Comment) (Scratches to abd and legs)                       Personal Care Assistance Level of Assistance  Total care       Total Care Assistance: Maximum assistance   Functional Limitations Info              SPECIAL CARE FACTORS FREQUENCY                       Contractures Contractures Info: Present    Additional Factors Info  Code Status, Allergies Code Status Info: DNR Allergies Info: Amantidines (Amantidines)           Current Medications (08/22/2022):    TAKE these medications     aspirin EC 325 MG tablet Take 325 mg by mouth daily.    azelastine 0.05 % ophthalmic solution Commonly known as: OPTIVAR Place 1 drop into both eyes 2 (two) times daily.    baclofen 20 MG tablet Commonly known as: LIORESAL Take 20 mg by mouth 3 (three) times daily.    carbamazepine 200 MG 12 hr tablet Commonly known as: TEGRETOL XR Take 200 mg by mouth 2 (two) times daily.    cetirizine 10 MG tablet Commonly known as: ZYRTEC Take 10 mg by mouth daily as needed for allergies.    dicyclomine 10 MG capsule Commonly known as: BENTYL Take 10 mg by mouth 4 (four) times daily -  before meals and at bedtime.    DSS 100 MG Caps Take 100 mg by mouth 2 (two) times daily.    eucerin cream Apply topically as needed for dry skin.    furosemide 20 MG tablet Commonly known as: LASIX Take 20 mg by mouth as needed.    hydrOXYzine 25 MG tablet Commonly known as: ATARAX  Take 25 mg by mouth 3 (three) times daily.    iron polysaccharides 150 MG capsule Commonly known as: NIFEREX Take 1 capsule by mouth daily.    levothyroxine 75 MCG tablet Commonly known as: SYNTHROID Take 75 mcg by mouth daily before breakfast.    LORazepam 1 MG tablet Commonly known as: ATIVAN Take 1 mg by mouth as needed.    metoCLOPramide 5 MG tablet Commonly known as: REGLAN Take 5 mg by mouth 4 (four) times daily as needed for nausea or vomiting.    mirtazapine 15 MG tablet Commonly known as: REMERON Take 15 mg by mouth at bedtime.    multivitamin capsule Take 1 capsule by mouth daily.    OLANZapine 5 MG tablet Commonly known as: ZYPREXA Take 5-10 mg by mouth 3 (three) times daily.    omeprazole  20 MG capsule Commonly known as: PRILOSEC Take 20 mg by mouth 2 (two) times daily.    sucralfate 1 GM/10ML suspension Commonly known as: CARAFATE Take 1 g by mouth 4 (four) times daily -  with meals and at bedtime.    Sulfacetamide Sodium (Acne) 10 % Lotn Apply topically 2 (two) times daily.    tamsulosin 0.4 MG Caps capsule Commonly known as: FLOMAX Take 0.4 mg by mouth at bedtime.    UNABLE TO FIND Med Name: CBD Oil 500mg /68mL.  1 dropper under tongue BID.    Discharge Medications: Please see discharge summary for a list of discharge medications.  Relevant Imaging Results:  Relevant Lab Results:   Additional Information SSN# [id number]  Truddie Hidden, RN

## 2022-08-22 NOTE — Plan of Care (Signed)

## 2022-08-22 NOTE — Care Management Important Message (Signed)
Important Message  Patient Details  Name: [name] MRN: [id number] Date of Birth: August 28, 1965   Medicare Important Message Given:  N/A - LOS <3 / Initial given by admissions     [name] 08/22/2022, 10:00 AM

## 2022-09-02 NOTE — Progress Notes (Signed)
  Progress Note   Date: 08/30/2022  Patient Name: [name]        MRN#: [id number]   Clarification of diagnosis:   Severe Sepsis (Sepsis with organ dysfunction)

## 2023-02-09 IMAGING — CT CT ABD-PELV W/O CM
2 of 4 series · 15 of 46 positions shown, 17 images · non-contrast
Comparison: None.

CLINICAL DATA: Nausea vomiting.



[Series 2: routine abd/(person_name) · axial · 0.71mm/px · z∈[-964,-594]mm · 12 of 88 slices shown, 14 images]
[im 7/88  soft-tissue]
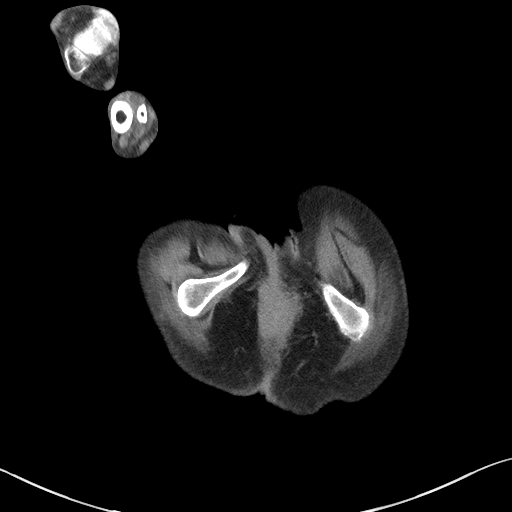
[im 7/88  bone]
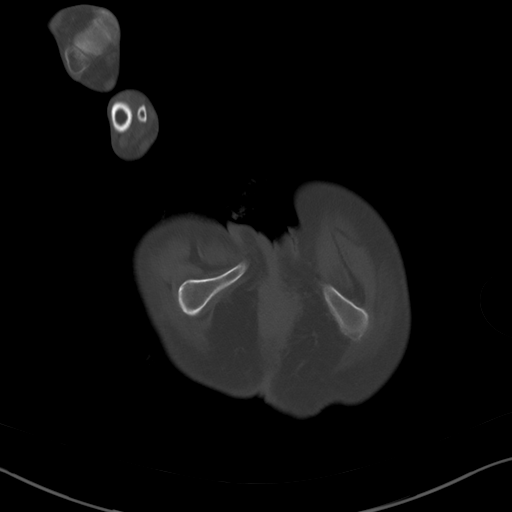
[im 14/88  soft-tissue]
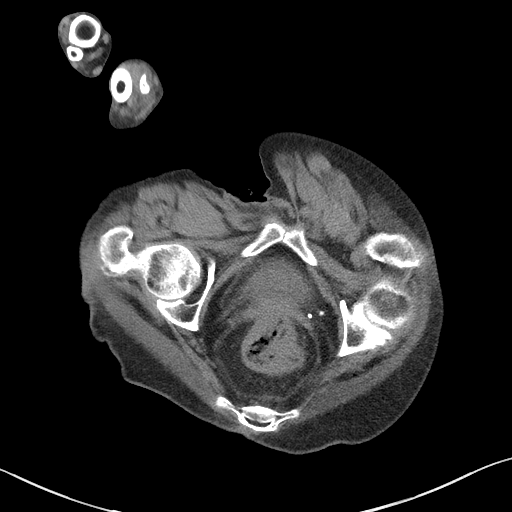
[im 21/88  soft-tissue]
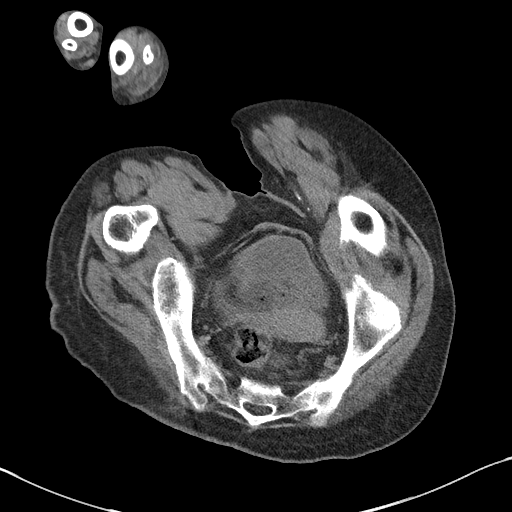
[im 27/88  soft-tissue]
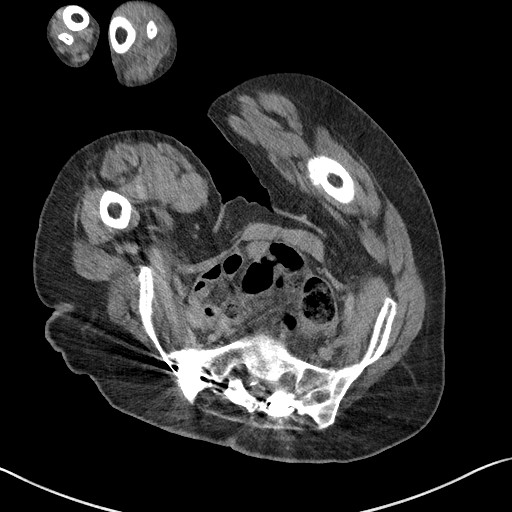
[im 34/88  soft-tissue]
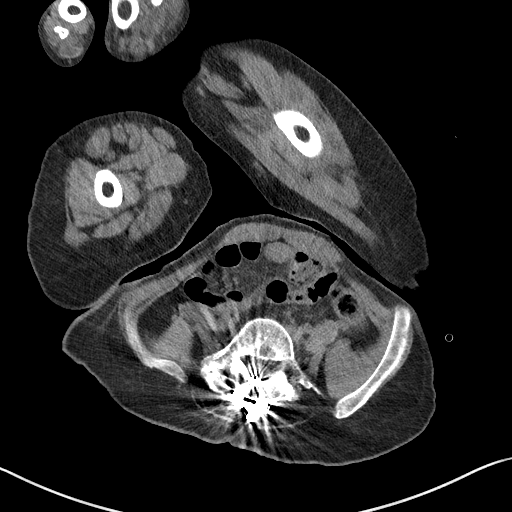
[im 41/88  soft-tissue]
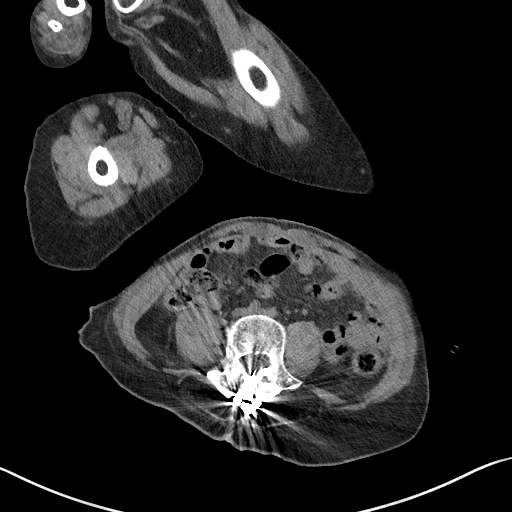
[im 47/88  soft-tissue]
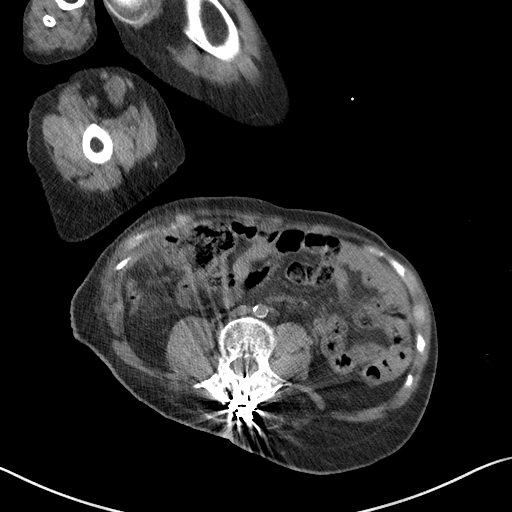
[im 54/88  soft-tissue]
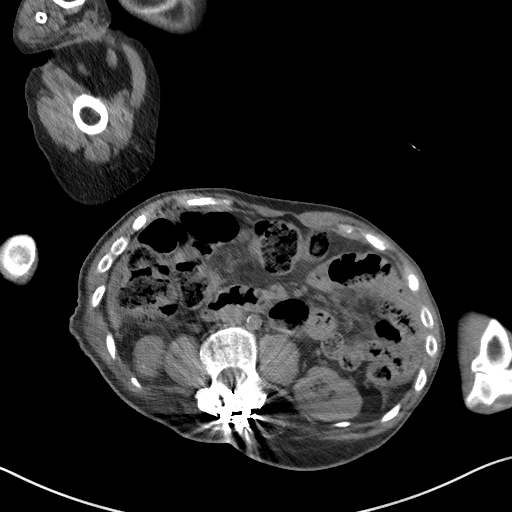
[im 61/88  soft-tissue]
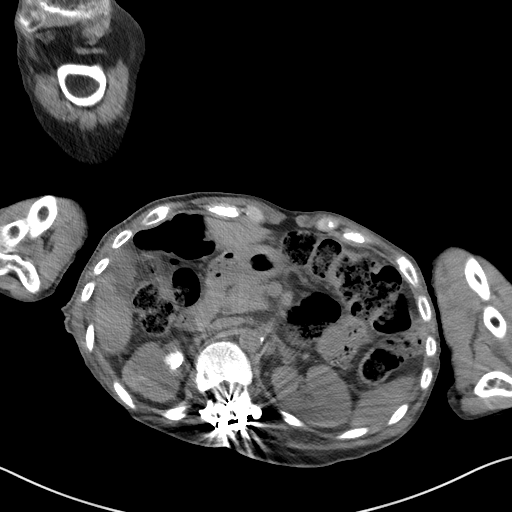
[im 61/88  bone]
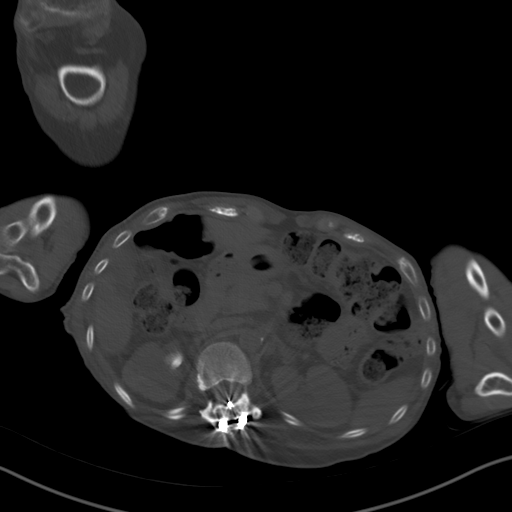
[im 67/88  soft-tissue]
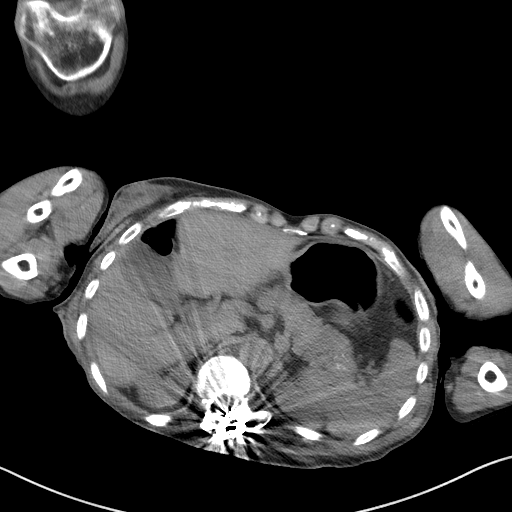
[im 74/88  soft-tissue]
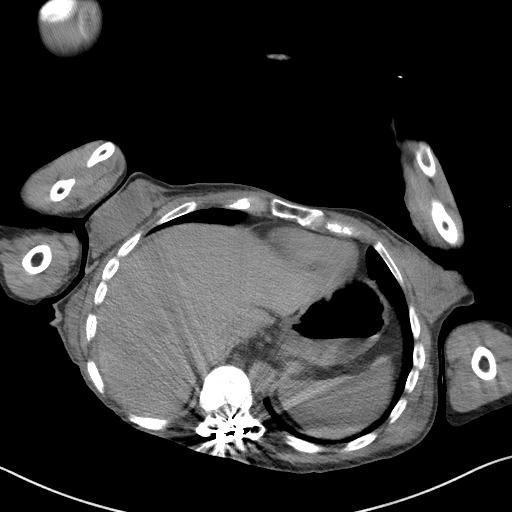
[im 81/88  soft-tissue]
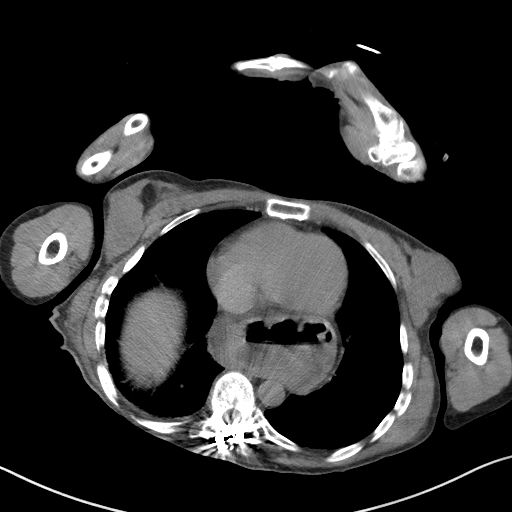

[Series 5: coronal (person_name) · coronal · 0.66mm/px · 3 of 72 slices shown]
[im 24/72  soft-tissue]
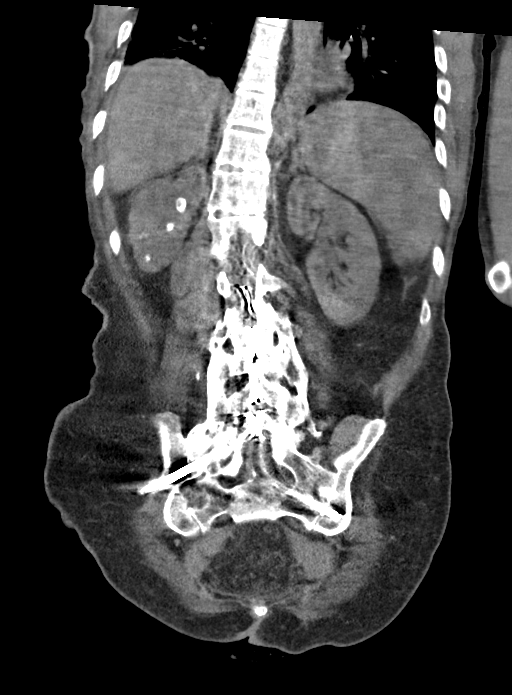
[im 32/72  soft-tissue]
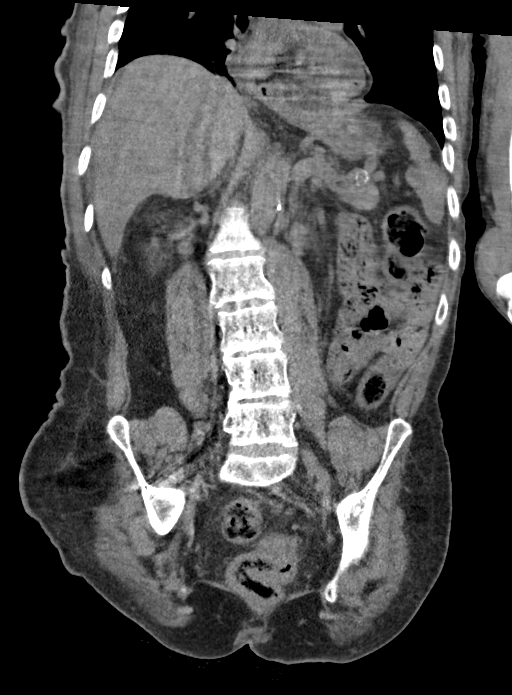
[im 40/72  soft-tissue]
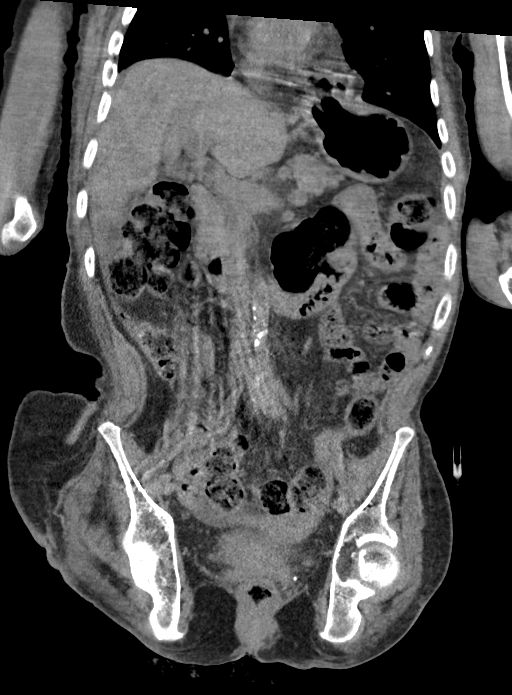

[15 of 46 positions shown; findings below may reference images not displayed]

FINDINGS: Lower chest: Large hiatal hernia.

Hepatobiliary: Limited due to noncontrast study and streak artifact
from spinal hardware. No focal abnormality in the liver on this
study without intravenous contrast. Gallbladder not well seen. No
intrahepatic or extrahepatic biliary dilation.

Pancreas: No focal mass lesion. No dilatation of the main duct. No
intraparenchymal cyst. No peripancreatic edema.

Spleen: No splenomegaly. No focal mass lesion.

Adrenals/Urinary Tract: No adrenal nodule or mass. Staghorn calculus
in the right renal pelvis measures on the order of 1.9 x 1.2 x
cm. Other scattered more peripheral stones are seen in the right
kidney. There is probably cortical thinning and right hydronephrosis
although this cannot be confidently a stab motion on today's
noncontrast study degraded by beam hardening artifact. No
hydronephrosis in the left kidney. No evidence for hydroureter.
Moderate bladder distension noted.

Stomach/Bowel: Large hiatal hernia. Duodenum is normally positioned
as is the ligament of Treitz. No small bowel dilatation. Neither the
terminal ileum nor the appendix are well demonstrated colon shows
diffuse moderate distension with moderate stool volume throughout.

Vascular/Lymphatic: There is mild atherosclerotic calcification of
the abdominal aorta without aneurysm. There is no gastrohepatic or
hepatoduodenal ligament lymphadenopathy. No retroperitoneal or
mesenteric lymphadenopathy. No pelvic sidewall lymphadenopathy.

Reproductive: The uterus is unremarkable.  There is no adnexal mass.

Other: No intraperitoneal free fluid.

Musculoskeletal: Extensive thoracolumbosacral fusion. No worrisome
lytic or sclerotic osseous abnormality.
IMPRESSION: 1. Limited exam due to noncontrast study and beam hardening artifact
from spinal hardware.
2. Staghorn calculus in the right renal pelvis with probable
cortical thinning and right hydronephrosis.
3. Large hiatal hernia.
4. Diffuse moderate distension of the colon with moderate stool
volume. Imaging features suggest clinical constipation.
5. Aortic Atherosclerosis ([id number]).

## 2023-02-25 ENCOUNTER — Encounter: Payer: Self-pay | Admitting: Obstetrics and Gynecology

## 2023-02-25 ENCOUNTER — Ambulatory Visit (INDEPENDENT_AMBULATORY_CARE_PROVIDER_SITE_OTHER): Payer: Medicare Other | Admitting: Obstetrics and Gynecology

## 2023-02-25 VITALS — Ht <= 58 in | Wt 105.0 lb

## 2023-02-25 DIAGNOSIS — Z01419 Encounter for gynecological examination (general) (routine) without abnormal findings: Secondary | ICD-10-CM

## 2023-02-25 DIAGNOSIS — Z1231 Encounter for screening mammogram for malignant neoplasm of breast: Secondary | ICD-10-CM

## 2023-02-25 NOTE — Progress Notes (Signed)
Patients presents for annual exam today with caregiver from group home. Caregiver states no bleeding or any additional concerns today. Due for pap smear and mammogram, she is unable to tolerate these tests, Annual labs completed by PCP.

## 2023-02-25 NOTE — Progress Notes (Signed)
 HPI:      [name] is a 58 y.o. G0P0000 who LMP was No LMP recorded. Patient is postmenopausal.  Subjective:   She presents today for her annual examination.  This mentally challenged postmenopausal woman is here for her 1 year follow-up.  Her caregiver is with her today and states that there is no increased vaginal discharge bleeding or other GYN issues.    Hx: The following portions of the patient's history were reviewed and updated as appropriate:             She  has a past medical history of Acne, Cerebral palsy (HCC), Kidney stones, Mental retardation, Myopia, Scoliosis, and Seizure disorder (HCC). She does not have any pertinent problems on file. She  has a past surgical history that includes Partial hip arthroplasty and Revision total knee arthroplasty. Her family history is not on file. She  reports that she has never smoked. She has never used smokeless tobacco. She reports that she does not drink alcohol and does not use drugs. She has a current medication list which includes the following prescription(s): aspirin  ec, azelastine, baclofen , carbamazepine , cetirizine, dicyclomine , dss, furosemide , hydroxyzine , iron  polysaccharides, levothyroxine , lorazepam , metoclopramide , mirtazapine , multivitamin, olanzapine , omeprazole, eucerin, sucralfate , sulfacetamide  sodium (acne), tamsulosin , and UNABLE TO FIND. She is allergic to amantadines.       Review of Systems:  Review of Systems  Constitutional: Denied constitutional symptoms, night sweats, recent illness, fatigue, fever, insomnia and weight loss.  Eyes: Denied eye symptoms, eye pain, photophobia, vision change and visual disturbance.  Ears/Nose/Throat/Neck: Denied ear, nose, throat or neck symptoms, hearing loss, nasal discharge, sinus congestion and sore throat.  Cardiovascular: Denied cardiovascular symptoms, arrhythmia, chest pain/pressure, edema, exercise intolerance, orthopnea and palpitations.  Respiratory: Denied  pulmonary symptoms, asthma, pleuritic pain, productive sputum, cough, dyspnea and wheezing.  Gastrointestinal: Denied, gastro-esophageal reflux, melena, nausea and vomiting.  Genitourinary: Denied genitourinary symptoms including symptomatic vaginal discharge, pelvic relaxation issues, and urinary complaints.  Musculoskeletal: Denied musculoskeletal symptoms, stiffness, swelling, muscle weakness and myalgia.  Dermatologic: Denied dermatology symptoms, rash and scar.  Neurologic: Denied neurology symptoms, dizziness, headache, neck pain and syncope.  Psychiatric: Denied psychiatric symptoms, anxiety and depression.  Endocrine: Denied endocrine symptoms including hot flashes and night sweats.   Meds:   Current Outpatient Medications on File Prior to Visit  Medication Sig Dispense Refill   aspirin  325 MG EC tablet Take 325 mg by mouth daily.     azelastine (OPTIVAR) 0.05 % ophthalmic solution Place 1 drop into both eyes 2 (two) times daily.     baclofen  (LIORESAL ) 20 MG tablet Take 20 mg by mouth 3 (three) times daily.     carbamazepine  (TEGRETOL  XR) 200 MG 12 hr tablet Take 200 mg by mouth 2 (two) times daily.     cetirizine (ZYRTEC) 10 MG tablet Take 10 mg by mouth daily as needed for allergies.     dicyclomine  (BENTYL ) 10 MG capsule Take 10 mg by mouth 4 (four) times daily -  before meals and at bedtime.     Docusate Sodium  (DSS) 100 MG CAPS Take 100 mg by mouth 2 (two) times daily.     furosemide  (LASIX ) 20 MG tablet Take 20 mg by mouth as needed.      hydrOXYzine  (ATARAX /VISTARIL ) 25 MG tablet Take 25 mg by mouth 3 (three) times daily.     iron  polysaccharides (NIFEREX) 150 MG capsule Take 1 capsule by mouth daily.     levothyroxine  (SYNTHROID ) 75 MCG tablet Take  75 mcg by mouth daily before breakfast.     LORazepam  (ATIVAN ) 1 MG tablet Take 1 mg by mouth as needed.      metoCLOPramide  (REGLAN ) 5 MG tablet Take 5 mg by mouth 4 (four) times daily as needed for nausea or vomiting.      mirtazapine  (REMERON ) 15 MG tablet Take 15 mg by mouth at bedtime.     Multiple Vitamin (MULTIVITAMIN) capsule Take 1 capsule by mouth daily.     OLANZapine  (ZYPREXA ) 5 MG tablet Take 5-10 mg by mouth 3 (three) times daily.     omeprazole (PRILOSEC) 20 MG capsule Take 20 mg by mouth 2 (two) times daily.      Skin Protectants, Misc. (EUCERIN) cream Apply topically as needed for dry skin.     sucralfate  (CARAFATE ) 1 GM/10ML suspension Take 1 g by mouth 4 (four) times daily -  with meals and at bedtime.     Sulfacetamide  Sodium, Acne, 10 % LOTN Apply topically 2 (two) times daily.      tamsulosin  (FLOMAX ) 0.4 MG CAPS capsule Take 0.4 mg by mouth at bedtime.     UNABLE TO FIND Med Name: CBD Oil 500mg /92mL.  1 dropper under tongue BID.     No current facility-administered medications on file prior to visit.     Objective:     Vitals:    Filed Weights   02/25/23 1320  Weight: 105 lb (47.6 kg)              Patient unable to comply with physical exam or Pap smear  Assessment:    G0P0000 Patient Active Problem List   Diagnosis Date Noted   Sepsis due to gram-negative UTI (HCC) 08/18/2022   Acute respiratory failure with hypoxia (HCC) 08/18/2022   COVID-19 virus infection 05/24/2022   Cerebral palsy (HCC) 05/24/2022   Seizure disorder (HCC) 05/24/2022   UTI (urinary tract infection) 05/24/2022   Thrombocytopenia (HCC) 05/24/2022   Nausea & vomiting 05/24/2022   Hypothyroidism 05/24/2022   Pleural effusion on left 05/24/2022   Liver lesion 05/24/2022   Intellectual disability 02/21/2015   Seizure disorder, primary (HCC) 02/21/2015   Menopause 02/21/2015   Abnormal liver function tests 02/21/2015     1. Well woman exam with routine gynecological exam     Mentally challenged   Plan:            1.  Basic Screening Recommendations The basic screening recommendations for asymptomatic women were discussed with the patient during her visit.  The age-appropriate recommendations  were discussed with her and the rational for the tests reviewed.  When I am informed by the patient that another primary care physician has previously obtained the age-appropriate tests and they are up-to-date, only outstanding tests are ordered and referrals given as necessary.  Abnormal results of tests will be discussed with her when all of her results are completed.  Routine preventative health maintenance measures emphasized: Exercise/Diet/Weight control, Tobacco Warnings, Alcohol/Substance use risks and Stress Management 2.  Follow-up if any GYN symptoms appear  Orders No orders of the defined types were placed in this encounter.   No orders of the defined types were placed in this encounter.         F/U  Return in about 1 year (around 02/25/2024) for Annual Physical.  Alm DOROTHA Sar, M.D. 02/25/2023 1:52 PM

## 2023-04-25 ENCOUNTER — Other Ambulatory Visit: Payer: Self-pay

## 2023-04-25 ENCOUNTER — Inpatient Hospital Stay
Admission: EM | Admit: 2023-04-25 | Discharge: 2023-04-27 | DRG: 871 | Disposition: A | Discharge status: Home / Self Care | Attending: Internal Medicine | Admitting: Internal Medicine

## 2023-04-25 ENCOUNTER — Emergency Department

## 2023-04-25 DIAGNOSIS — T17918A Gastric contents in respiratory tract, part unspecified causing other injury, initial encounter: Principal | ICD-10-CM

## 2023-04-25 DIAGNOSIS — F32A Depression, unspecified: Secondary | ICD-10-CM

## 2023-04-25 DIAGNOSIS — R0902 Hypoxemia: Secondary | ICD-10-CM

## 2023-04-25 DIAGNOSIS — J690 Pneumonitis due to inhalation of food and vomit: Secondary | ICD-10-CM

## 2023-04-25 DIAGNOSIS — E039 Hypothyroidism, unspecified: Secondary | ICD-10-CM | POA: Diagnosis present

## 2023-04-25 DIAGNOSIS — J9601 Acute respiratory failure with hypoxia: Secondary | ICD-10-CM | POA: Diagnosis present

## 2023-04-25 DIAGNOSIS — Z79890 Hormone replacement therapy (postmenopausal): Secondary | ICD-10-CM

## 2023-04-25 DIAGNOSIS — M419 Scoliosis, unspecified: Secondary | ICD-10-CM | POA: Diagnosis present

## 2023-04-25 DIAGNOSIS — W44F9XA Other object of natural or organic material, entering into or through a natural orifice, initial encounter: Secondary | ICD-10-CM | POA: Diagnosis present

## 2023-04-25 DIAGNOSIS — A4150 Gram-negative sepsis, unspecified: Secondary | ICD-10-CM | POA: Diagnosis not present

## 2023-04-25 DIAGNOSIS — Z888 Allergy status to other drugs, medicaments and biological substances status: Secondary | ICD-10-CM

## 2023-04-25 DIAGNOSIS — Z66 Do not resuscitate: Secondary | ICD-10-CM | POA: Diagnosis present

## 2023-04-25 DIAGNOSIS — Z79899 Other long term (current) drug therapy: Secondary | ICD-10-CM

## 2023-04-25 DIAGNOSIS — K219 Gastro-esophageal reflux disease without esophagitis: Secondary | ICD-10-CM | POA: Insufficient documentation

## 2023-04-25 DIAGNOSIS — F79 Unspecified intellectual disabilities: Secondary | ICD-10-CM | POA: Diagnosis present

## 2023-04-25 DIAGNOSIS — Z7982 Long term (current) use of aspirin: Secondary | ICD-10-CM

## 2023-04-25 DIAGNOSIS — N3000 Acute cystitis without hematuria: Secondary | ICD-10-CM

## 2023-04-25 DIAGNOSIS — Z87442 Personal history of urinary calculi: Secondary | ICD-10-CM

## 2023-04-25 DIAGNOSIS — Z1152 Encounter for screening for COVID-19: Secondary | ICD-10-CM

## 2023-04-25 DIAGNOSIS — G40909 Epilepsy, unspecified, not intractable, without status epilepticus: Secondary | ICD-10-CM | POA: Diagnosis present

## 2023-04-25 DIAGNOSIS — R451 Restlessness and agitation: Secondary | ICD-10-CM | POA: Diagnosis present

## 2023-04-25 DIAGNOSIS — G809 Cerebral palsy, unspecified: Secondary | ICD-10-CM | POA: Diagnosis present

## 2023-04-25 DIAGNOSIS — F419 Anxiety disorder, unspecified: Secondary | ICD-10-CM | POA: Diagnosis present

## 2023-04-25 LAB — URINALYSIS, W/ REFLEX TO CULTURE (INFECTION SUSPECTED)
Bilirubin Urine: NEGATIVE
Glucose, UA: NEGATIVE mg/dL
Hgb urine dipstick: NEGATIVE
Ketones, ur: NEGATIVE mg/dL
Nitrite: NEGATIVE
Protein, ur: NEGATIVE mg/dL
Specific Gravity, Urine: 1.025 (ref 1.005–1.030)
pH: 5 (ref 5.0–8.0)

## 2023-04-25 LAB — CBC WITH DIFFERENTIAL/PLATELET
Abs Immature Granulocytes: 0.03 10*3/uL (ref 0.00–0.07)
Basophils Absolute: 0.1 10*3/uL (ref 0.0–0.1)
Basophils Relative: 1 %
Eosinophils Absolute: 0.3 10*3/uL (ref 0.0–0.5)
Eosinophils Relative: 5 %
HCT: 40.2 % (ref 36.0–46.0)
Hemoglobin: 12.8 g/dL (ref 12.0–15.0)
Immature Granulocytes: 1 %
Lymphocytes Relative: 10 %
Lymphs Abs: 0.6 10*3/uL — ABNORMAL LOW (ref 0.7–4.0)
MCH: 29.8 pg (ref 26.0–34.0)
MCHC: 31.8 g/dL (ref 30.0–36.0)
MCV: 93.5 fL (ref 80.0–100.0)
Monocytes Absolute: 0.5 10*3/uL (ref 0.1–1.0)
Monocytes Relative: 7 %
Neutro Abs: 4.9 10*3/uL (ref 1.7–7.7)
Neutrophils Relative %: 76 %
Platelets: 175 10*3/uL (ref 150–400)
RBC: 4.3 MIL/uL (ref 3.87–5.11)
RDW: 14.3 % (ref 11.5–15.5)
WBC: 6.4 10*3/uL (ref 4.0–10.5)
nRBC: 0 % (ref 0.0–0.2)

## 2023-04-25 LAB — COMPREHENSIVE METABOLIC PANEL WITH GFR
ALT: 19 U/L (ref 0–44)
AST: 21 U/L (ref 15–41)
Albumin: 3.3 g/dL — ABNORMAL LOW (ref 3.5–5.0)
Alkaline Phosphatase: 192 U/L — ABNORMAL HIGH (ref 38–126)
Anion gap: 8 (ref 5–15)
BUN: 25 mg/dL — ABNORMAL HIGH (ref 6–20)
CO2: 29 mmol/L (ref 22–32)
Calcium: 8.9 mg/dL (ref 8.9–10.3)
Chloride: 105 mmol/L (ref 98–111)
Creatinine, Ser: 0.79 mg/dL (ref 0.44–1.00)
GFR, Estimated: >60 mL/min (ref >60)
Glucose, Bld: 117 mg/dL — ABNORMAL HIGH (ref 70–99)
Potassium: 3.8 mmol/L (ref 3.5–5.1)
Sodium: 142 mmol/L (ref 135–145)
Total Bilirubin: 0.5 mg/dL (ref 0.0–1.2)
Total Protein: 6.5 g/dL (ref 6.5–8.1)

## 2023-04-25 LAB — RESP PANEL BY RT-PCR (RSV, FLU A&B, COVID)  RVPGX2
Influenza A by PCR: NEGATIVE
Influenza B by PCR: NEGATIVE
Resp Syncytial Virus by PCR: NEGATIVE
SARS Coronavirus 2 by RT PCR: NEGATIVE

## 2023-04-25 LAB — LACTIC ACID, PLASMA: Lactic Acid, Venous: 1.9 mmol/L (ref 0.5–1.9)

## 2023-04-25 MED ORDER — OLANZAPINE 10 MG IM SOLR: 5.0000 mg | Freq: Once | INTRAMUSCULAR | Status: DC

## 2023-04-25 MED ORDER — MIDAZOLAM HCL 2 MG/2ML IJ SOLN
2.0000 mg | Freq: Once | INTRAMUSCULAR | Status: AC
  Administered 2023-04-25: 2 mg via INTRAMUSCULAR
  Filled 2023-04-25: qty 2

## 2023-04-25 MED ORDER — SODIUM CHLORIDE 0.9 % IV SOLN
500.0000 mg | Freq: Once | INTRAVENOUS | Status: AC
  Administered 2023-04-25: 500 mg via INTRAVENOUS
  Filled 2023-04-25: qty 5

## 2023-04-25 MED ORDER — SODIUM CHLORIDE 0.9 % IV SOLN
2.0000 g | Freq: Once | INTRAVENOUS | Status: AC
  Administered 2023-04-25: 2 g via INTRAVENOUS
  Filled 2023-04-25: qty 20

## 2023-04-25 MED ORDER — OLANZAPINE 10 MG IM SOLR
5.0000 mg | Freq: Once | INTRAMUSCULAR | Status: DC | PRN
Start: 2023-04-25 — End: 2023-04-27

## 2023-04-25 NOTE — ED Notes (Signed)
 Pt put on room air as pt does not normally wear 02 and was sating at 98% on 2L .

## 2023-04-25 NOTE — Progress Notes (Signed)
 CODE SEPSIS - PHARMACY COMMUNICATION  **Broad Spectrum Antibiotics should be administered within 1 hour of Sepsis diagnosis**  Time Code Sepsis Called/Page Received: 2124  Antibiotics Ordered: Azithromycin, ceftriaxone  Time of 1st antibiotic administration: 2207  Additional action taken by pharmacy: N/A  If necessary, Name of Provider/Nurse Contacted: N/A    Merryl Hacker ,PharmD Clinical Pharmacist  04/25/2023  9:24 PM

## 2023-04-25 NOTE — ED Notes (Signed)
 Pt's mom called and given an update. Mom is guardian and MPA. Mom would like to be called when all test results are in and plan to go home or be admitted are decided.

## 2023-04-25 NOTE — Progress Notes (Signed)
 Elink monitoring for the code sepsis protocol.

## 2023-04-25 NOTE — ED Triage Notes (Signed)
 Pt to ED via [facility] for aspirating on pt's own vomit. She is from [facility] in [place].   Pt vomited shortly after taking her evening meds and aspirated. PT is non-verbal and will scratch and bite.   EMS was able to give O2 with a blow by method and brought sats up into the 90s  EMS was not able to get a BP.  HR: 108 T: 99.2 axilary  EMS gave 4mg  zofran

## 2023-04-25 NOTE — ED Provider Notes (Signed)
 [facility] Provider Note    Event Date/Time   First MD Initiated Contact with Patient 04/25/23 2121     (approximate)   History   Aspiration   HPI  [name] is a 58 year old female with history of cerebral palsy, seizure disorder presenting to the emergency department with concerns for aspiration.  Shortly after receiving her medications today, patient had onset of vomiting followed by coughing.  With EMS, sats in the low 80s.  Patient was agitated limiting ability to obtain vitals, but they were able to apply blow-by oxygen with improvement in sats to the 90s.       Physical Exam   Triage Vital Signs: ED Triage Vitals  Encounter Vitals Group     BP 04/25/23 2116 (!) 151/113     Systolic BP Percentile --      Diastolic BP Percentile --      Pulse Rate 04/25/23 2116 (!) 140     Resp 04/25/23 2116 (!) 22     Temp --      Temp src --      SpO2 04/25/23 2122 92 %     Weight --      Height --      Head Circumference --      Peak Flow --      Pain Score --      Pain Loc --      Pain Education --      Exclude from Growth Chart --     Most recent vital signs: Vitals:   04/25/23 2300 04/26/23 0000  BP: 112/86 117/75  Pulse: 93 85  Resp: 18 13  SpO2: 96% 96%     General: Awake, agitated during evaluation CV:  Tachycardic with heart rates up to the 140s but improving when left alone  Resp:  Fair air movement, lung sounds somewhat coarse at bilateral bases, tachypneic Abd:  Nondistended. Neuro:  Contractions and deficits consistent with known history of cerebral palsy   ED Results / Procedures / Treatments   Labs (all labs ordered are listed, but only abnormal results are displayed) Labs Reviewed  COMPREHENSIVE METABOLIC PANEL WITH GFR - Abnormal; Notable for the following components:      Result Value   Glucose, Bld 117 (*)    BUN 25 (*)    Albumin 3.3 (*)    Alkaline Phosphatase 192 (*)    All other components within  normal limits  URINALYSIS, W/ REFLEX TO CULTURE (INFECTION SUSPECTED) - Abnormal; Notable for the following components:   Color, Urine AMBER (*)    APPearance CLOUDY (*)    Leukocytes,Ua SMALL (*)    Bacteria, UA MANY (*)    All other components within normal limits  CBC WITH DIFFERENTIAL/PLATELET - Abnormal; Notable for the following components:   Lymphs Abs 0.6 (*)    All other components within normal limits  RESP PANEL BY RT-PCR (RSV, FLU A&B, COVID)  [id number]  CULTURE, BLOOD (ROUTINE X 2)  CULTURE, BLOOD (ROUTINE X 2)  URINE CULTURE  LACTIC ACID, PLASMA  CBC WITH DIFFERENTIAL/PLATELET     EKG EKG independently reviewed interpreted by myself (ER attending) demonstrates:  EKG demonstrate sinus tachycardia rate of 111, PR 148, QRS 90, QTc 437, no acute ST changes  RADIOLOGY Imaging independently reviewed and interpreted by myself demonstrates:  CXR without focal consolidation  PROCEDURES:  Critical Care performed: Yes, see critical care procedure note(s)  CRITICAL CARE Performed by: Trinna Post  Total critical care time: 32 minutes  Critical care time was exclusive of separately billable procedures and treating other patients.  Critical care was necessary to treat or prevent imminent or life-threatening deterioration.  Critical care was time spent personally by me on the following activities: development of treatment plan with patient and/or surrogate as well as nursing, discussions with consultants, evaluation of patient's response to treatment, examination of patient, obtaining history from patient or surrogate, ordering and performing treatments and interventions, ordering and review of laboratory studies, ordering and review of radiographic studies, pulse oximetry and re-evaluation of patient's condition.   Procedures   MEDICATIONS ORDERED IN ED: Medications  OLANZapine (ZYPREXA) injection 5 mg (has no administration in time range)  cefTRIAXone (ROCEPHIN) 2 g in  sodium chloride 0.9 % 100 mL IVPB (0 g Intravenous Stopped 04/25/23 2217)  azithromycin (ZITHROMAX) 500 mg in sodium chloride 0.9 % 250 mL IVPB (0 mg Intravenous Stopped 04/25/23 2324)  midazolam (VERSED) injection 2 mg (2 mg Intramuscular Given 04/25/23 2127)     IMPRESSION / MDM / ASSESSMENT AND PLAN / ED COURSE  I reviewed the triage vital signs and the nursing notes.  Differential diagnosis includes, but is not limited to, aspiration pneumonia, pneumothorax, pneumonitis, UTI, anemia, electrolyte abnormality  Patient's presentation is most consistent with acute presentation with potential threat to life or bodily function.  58 year old female presenting after suspected aspiration event tachycardic, tachypneic, with hypoxia.  Patient agitated on presentation limiting evaluation and ability to provide care.  She was given 2 mg of IM Versed with improvement.  Labs overall reassuring with normal white blood cell count at 6.4.  Urine is concerning for infection with many bacteria and 21-50 white blood cells.  Normal lactate.  Viral swab negative.  X-Truxton Stupka reassuring, but clinical history remains concerning for aspiration.  Vital signs did improve here.  Patient was able to be weaned from 4 L to 2 L, but unfortunately, after multiple attempts trying to wean her to room air she had recurrent hypoxia requiring persistent oxygen.  In the setting of this, do think she is appropriate for admission.  Case discussed with hospitalist team.  They will evaluate for anticipated admission.    FINAL CLINICAL IMPRESSION(S) / ED DIAGNOSES   Final diagnoses:  Aspiration of gastric contents, initial encounter  Hypoxia  Acute cystitis without hematuria     Rx / DC Orders   ED Discharge Orders     None        Note:  This document was prepared using Dragon voice recognition software and may include unintentional dictation errors.   Trinna Post, MD 04/26/23 [phone]

## 2023-04-25 NOTE — ED Notes (Signed)
 Pt's [place] turned down to 2L per MD as pt's 02 saturations are good.

## 2023-04-25 NOTE — ED Notes (Signed)
 Pt could not tolerate room air. 02 dropped to 88. Pt placed back on 2L N and 02 came up to 96%.

## 2023-04-26 ENCOUNTER — Encounter: Payer: Self-pay | Admitting: Family Medicine

## 2023-04-26 ENCOUNTER — Other Ambulatory Visit: Payer: Self-pay

## 2023-04-26 DIAGNOSIS — E039 Hypothyroidism, unspecified: Secondary | ICD-10-CM

## 2023-04-26 DIAGNOSIS — R451 Restlessness and agitation: Secondary | ICD-10-CM | POA: Diagnosis present

## 2023-04-26 DIAGNOSIS — Z888 Allergy status to other drugs, medicaments and biological substances status: Secondary | ICD-10-CM | POA: Diagnosis not present

## 2023-04-26 DIAGNOSIS — Z79899 Other long term (current) drug therapy: Secondary | ICD-10-CM | POA: Diagnosis not present

## 2023-04-26 DIAGNOSIS — F32A Depression, unspecified: Secondary | ICD-10-CM | POA: Diagnosis present

## 2023-04-26 DIAGNOSIS — M419 Scoliosis, unspecified: Secondary | ICD-10-CM | POA: Diagnosis present

## 2023-04-26 DIAGNOSIS — K219 Gastro-esophageal reflux disease without esophagitis: Secondary | ICD-10-CM | POA: Diagnosis present

## 2023-04-26 DIAGNOSIS — W44F9XA Other object of natural or organic material, entering into or through a natural orifice, initial encounter: Secondary | ICD-10-CM | POA: Diagnosis present

## 2023-04-26 DIAGNOSIS — T17918A Gastric contents in respiratory tract, part unspecified causing other injury, initial encounter: Secondary | ICD-10-CM | POA: Diagnosis present

## 2023-04-26 DIAGNOSIS — Z515 Encounter for palliative care: Secondary | ICD-10-CM

## 2023-04-26 DIAGNOSIS — A4150 Gram-negative sepsis, unspecified: Secondary | ICD-10-CM

## 2023-04-26 DIAGNOSIS — Z1152 Encounter for screening for COVID-19: Secondary | ICD-10-CM | POA: Diagnosis not present

## 2023-04-26 DIAGNOSIS — F419 Anxiety disorder, unspecified: Secondary | ICD-10-CM | POA: Diagnosis present

## 2023-04-26 DIAGNOSIS — J690 Pneumonitis due to inhalation of food and vomit: Secondary | ICD-10-CM

## 2023-04-26 DIAGNOSIS — Z79890 Hormone replacement therapy (postmenopausal): Secondary | ICD-10-CM | POA: Diagnosis not present

## 2023-04-26 DIAGNOSIS — F79 Unspecified intellectual disabilities: Secondary | ICD-10-CM | POA: Diagnosis present

## 2023-04-26 DIAGNOSIS — G809 Cerebral palsy, unspecified: Secondary | ICD-10-CM | POA: Diagnosis present

## 2023-04-26 DIAGNOSIS — R0902 Hypoxemia: Secondary | ICD-10-CM

## 2023-04-26 DIAGNOSIS — N390 Urinary tract infection, site not specified: Secondary | ICD-10-CM | POA: Diagnosis not present

## 2023-04-26 DIAGNOSIS — Z66 Do not resuscitate: Secondary | ICD-10-CM | POA: Diagnosis present

## 2023-04-26 DIAGNOSIS — G40909 Epilepsy, unspecified, not intractable, without status epilepticus: Secondary | ICD-10-CM | POA: Diagnosis present

## 2023-04-26 DIAGNOSIS — J9601 Acute respiratory failure with hypoxia: Secondary | ICD-10-CM | POA: Diagnosis present

## 2023-04-26 DIAGNOSIS — N3000 Acute cystitis without hematuria: Secondary | ICD-10-CM | POA: Diagnosis present

## 2023-04-26 DIAGNOSIS — Z87442 Personal history of urinary calculi: Secondary | ICD-10-CM | POA: Diagnosis not present

## 2023-04-26 DIAGNOSIS — Z7982 Long term (current) use of aspirin: Secondary | ICD-10-CM | POA: Diagnosis not present

## 2023-04-26 LAB — PROTIME-INR
INR: 1.2 (ref 0.8–1.2)
Prothrombin Time: 15.3 s — ABNORMAL HIGH (ref 11.4–15.2)

## 2023-04-26 LAB — APTT: aPTT: 29 s (ref 24–36)

## 2023-04-26 MED ORDER — METHYLPREDNISOLONE SODIUM SUCC 40 MG IJ SOLR
40.0000 mg | Freq: Every day | INTRAMUSCULAR | Status: DC
  Administered 2023-04-26 – 2023-04-27 (×2): 40 mg via INTRAVENOUS
  Filled 2023-04-26 (×2): qty 1

## 2023-04-26 MED ORDER — SODIUM CHLORIDE 0.9 % IV SOLN
2.0000 g | INTRAVENOUS | Status: DC
  Administered 2023-04-26: 2 g via INTRAVENOUS
  Filled 2023-04-26 (×2): qty 20

## 2023-04-26 MED ORDER — ADULT MULTIVITAMIN W/MINERALS CH
1.0000 | ORAL_TABLET | Freq: Every day | ORAL | Status: DC
  Administered 2023-04-27: 1 via ORAL
  Filled 2023-04-26: qty 1

## 2023-04-26 MED ORDER — ONDANSETRON HCL 4 MG/2ML IJ SOLN: 4.0000 mg | Freq: Four times a day (QID) | INTRAMUSCULAR | Status: DC | PRN

## 2023-04-26 MED ORDER — MIRTAZAPINE 15 MG PO TABS
15.0000 mg | ORAL_TABLET | Freq: Every day | ORAL | Status: DC
  Administered 2023-04-26: 15 mg via ORAL
  Filled 2023-04-26: qty 1

## 2023-04-26 MED ORDER — ENOXAPARIN SODIUM 40 MG/0.4ML IJ SOSY
40.0000 mg | PREFILLED_SYRINGE | INTRAMUSCULAR | Status: DC
  Administered 2023-04-26 – 2023-04-27 (×2): 40 mg via SUBCUTANEOUS
  Filled 2023-04-26 (×2): qty 0.4

## 2023-04-26 MED ORDER — BACLOFEN 10 MG PO TABS
20.0000 mg | ORAL_TABLET | Freq: Three times a day (TID) | ORAL | Status: DC
  Administered 2023-04-26 – 2023-04-27 (×4): 20 mg via ORAL
  Filled 2023-04-26 (×4): qty 2

## 2023-04-26 MED ORDER — MAGNESIUM HYDROXIDE 400 MG/5ML PO SUSP: 30.0000 mL | Freq: Every day | ORAL | Status: DC | PRN

## 2023-04-26 MED ORDER — TRAZODONE HCL 50 MG PO TABS
25.0000 mg | ORAL_TABLET | Freq: Every evening | ORAL | Status: DC | PRN
  Administered 2023-04-26: 25 mg via ORAL
  Filled 2023-04-26: qty 1

## 2023-04-26 MED ORDER — SODIUM CHLORIDE 0.9 % IV SOLN: INTRAVENOUS | Status: DC

## 2023-04-26 MED ORDER — LEVOTHYROXINE SODIUM 50 MCG PO TABS
75.0000 ug | ORAL_TABLET | Freq: Every day | ORAL | Status: DC
  Administered 2023-04-26 – 2023-04-27 (×2): 75 ug via ORAL
  Filled 2023-04-26: qty 1
  Filled 2023-04-26: qty 2

## 2023-04-26 MED ORDER — HYDROXYZINE HCL 50 MG PO TABS
25.0000 mg | ORAL_TABLET | Freq: Three times a day (TID) | ORAL | Status: DC
  Administered 2023-04-26 – 2023-04-27 (×3): 25 mg via ORAL
  Filled 2023-04-26 (×5): qty 1

## 2023-04-26 MED ORDER — OLANZAPINE 10 MG PO TABS
10.0000 mg | ORAL_TABLET | Freq: Every day | ORAL | Status: DC
  Administered 2023-04-26: 10 mg via ORAL
  Filled 2023-04-26 (×2): qty 1

## 2023-04-26 MED ORDER — ACETAMINOPHEN 325 MG PO TABS: 650.0000 mg | ORAL_TABLET | Freq: Four times a day (QID) | ORAL | Status: DC | PRN

## 2023-04-26 MED ORDER — OLANZAPINE 5 MG PO TABS
5.0000 mg | ORAL_TABLET | ORAL | Status: DC
  Administered 2023-04-27 (×2): 5 mg via ORAL
  Filled 2023-04-26 (×2): qty 1

## 2023-04-26 MED ORDER — DICYCLOMINE HCL 10 MG PO CAPS
10.0000 mg | ORAL_CAPSULE | Freq: Three times a day (TID) | ORAL | Status: DC
  Administered 2023-04-26 – 2023-04-27 (×5): 10 mg via ORAL
  Filled 2023-04-26 (×5): qty 1

## 2023-04-26 MED ORDER — LORAZEPAM 1 MG PO TABS: 1.0000 mg | ORAL_TABLET | ORAL | Status: DC | PRN

## 2023-04-26 MED ORDER — LACTATED RINGERS IV SOLN: 150.0000 mL/h | INTRAVENOUS | Status: DC

## 2023-04-26 MED ORDER — SODIUM CHLORIDE 0.9 % IV SOLN
500.0000 mg | INTRAVENOUS | Status: DC
  Administered 2023-04-26: 500 mg via INTRAVENOUS
  Filled 2023-04-26: qty 5

## 2023-04-26 MED ORDER — TAMSULOSIN HCL 0.4 MG PO CAPS
0.4000 mg | ORAL_CAPSULE | Freq: Every day | ORAL | Status: DC
Start: 2023-04-26 — End: 2023-04-27
  Administered 2023-04-26: 0.4 mg via ORAL
  Filled 2023-04-26: qty 1

## 2023-04-26 MED ORDER — PANTOPRAZOLE SODIUM 40 MG PO TBEC
40.0000 mg | DELAYED_RELEASE_TABLET | Freq: Every day | ORAL | Status: DC
  Administered 2023-04-27: 40 mg via ORAL
  Filled 2023-04-26: qty 1

## 2023-04-26 MED ORDER — POLYSACCHARIDE IRON COMPLEX 150 MG PO CAPS
150.0000 mg | ORAL_CAPSULE | Freq: Every day | ORAL | Status: DC
  Administered 2023-04-27: 150 mg via ORAL
  Filled 2023-04-26: qty 1

## 2023-04-26 MED ORDER — DOCUSATE SODIUM 100 MG PO CAPS
100.0000 mg | ORAL_CAPSULE | Freq: Two times a day (BID) | ORAL | Status: DC
  Administered 2023-04-26 – 2023-04-27 (×3): 100 mg via ORAL
  Filled 2023-04-26 (×2): qty 1

## 2023-04-26 MED ORDER — OLANZAPINE 5 MG PO TABS: 5.0000 mg | ORAL_TABLET | Freq: Three times a day (TID) | ORAL | Status: DC

## 2023-04-26 MED ORDER — SUCRALFATE 1 GM/10ML PO SUSP
1.0000 g | Freq: Three times a day (TID) | ORAL | Status: DC
  Administered 2023-04-26 – 2023-04-27 (×5): 1 g via ORAL
  Filled 2023-04-26 (×6): qty 10

## 2023-04-26 MED ORDER — CARBAMAZEPINE ER 200 MG PO TB12
200.0000 mg | ORAL_TABLET | Freq: Two times a day (BID) | ORAL | Status: DC
Start: 2023-04-26 — End: 2023-04-27
  Administered 2023-04-26 – 2023-04-27 (×3): 200 mg via ORAL
  Filled 2023-04-26 (×5): qty 1

## 2023-04-26 MED ORDER — LORAZEPAM 2 MG/ML IJ SOLN
1.0000 mg | INTRAMUSCULAR | Status: DC | PRN
  Administered 2023-04-26: 1 mg via INTRAVENOUS
  Filled 2023-04-26: qty 1

## 2023-04-26 MED ORDER — FUROSEMIDE 20 MG PO TABS: 20.0000 mg | ORAL_TABLET | ORAL | Status: DC | PRN

## 2023-04-26 MED ORDER — LORATADINE 10 MG PO TABS
10.0000 mg | ORAL_TABLET | Freq: Every day | ORAL | Status: DC
  Administered 2023-04-27: 10 mg via ORAL
  Filled 2023-04-26: qty 1

## 2023-04-26 MED ORDER — ACETAMINOPHEN 650 MG RE SUPP: 650.0000 mg | Freq: Four times a day (QID) | RECTAL | Status: DC | PRN

## 2023-04-26 MED ORDER — SULFACETAMIDE SODIUM (ACNE) 10 % EX LOTN: TOPICAL_LOTION | Freq: Two times a day (BID) | CUTANEOUS | Status: DC

## 2023-04-26 MED ORDER — ONDANSETRON HCL 4 MG PO TABS: 4.0000 mg | ORAL_TABLET | Freq: Four times a day (QID) | ORAL | Status: DC | PRN

## 2023-04-26 MED ORDER — HYDROCERIN EX CREA: 1.0000 | TOPICAL_CREAM | CUTANEOUS | Status: DC | PRN

## 2023-04-26 MED ORDER — KETOTIFEN FUMARATE 0.035 % OP SOLN
1.0000 [drp] | Freq: Two times a day (BID) | OPHTHALMIC | Status: DC
  Administered 2023-04-26 – 2023-04-27 (×3): 1 [drp] via OPHTHALMIC
  Filled 2023-04-26 (×2): qty 5
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
## 2023-04-26 NOTE — Assessment & Plan Note (Addendum)
-   We will continue Ativan, and Zyprexa as well as Remeron.Marland Kitchen

## 2023-04-26 NOTE — ED Notes (Signed)
 RN came back to check on patient. Patient pulled out IV. Also pulled off soft mitts that had been applied last night by 2nd shift RN.

## 2023-04-26 NOTE — ED Notes (Signed)
 All due medications at 0200 not verified by pharmacy.

## 2023-04-26 NOTE — ED Notes (Signed)
 Per Sherryll Burger, MD to discontinue IV fluids.
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
## 2023-04-26 NOTE — ED Notes (Signed)
 Talked with one of the nurses from patients group home to provide update.

## 2023-04-26 NOTE — ED Notes (Signed)
 Messaged MD about patient's behaviors and RN attempting to give care to patient. RN attempted to change patient's brief.

## 2023-04-26 NOTE — ED Notes (Signed)
 Call On Call number for [facility] when pt is ready to go home or if any kind of assistance is needed or questions.   [phone]

## 2023-04-26 NOTE — ED Notes (Signed)
 Sherryll Burger, MD instructed no need to keep nasal canula on. Oxygen saturation maintaining at >92%.

## 2023-04-26 NOTE — TOC Progression Note (Signed)
 Transition of Care [facility]) - Progression Note    Patient Details  Name: [name] MRN: [id number] Date of Birth: 1965/10/08  Transition [facility] Care [facility]) CM/SW Contact  Elberta Fortis, RN Phone Number: 04/26/2023, 5:00 PM  Clinical Narrative:    Asked by MD to call her group home to see if she can return tomorrow. Called [facility] house) @ [phone] and spoke to Tia. Tia states patient may return tomorrow and she will let the house manager know.         Expected Discharge Plan and Services                                               Social Determinants of Health (SDOH) Interventions SDOH Screenings   Food Insecurity: Patient Unable To Answer (04/26/2023)  Housing: Patient Unable To Answer (04/26/2023)  Transportation Needs: Patient Unable To Answer (04/26/2023)  Utilities: Patient Unable To Answer (04/26/2023)  Financial Resource Strain: Patient Unable To Answer (08/06/2022)   Received from [facility]  Social Connections: Unknown (04/26/2023)  Tobacco Use: Low Risk  (04/26/2023)    Readmission Risk Interventions     No data to display

## 2023-04-26 NOTE — ED Notes (Signed)
 CCMD called for cardiac monitoring.

## 2023-04-26 NOTE — ED Notes (Signed)
 Patient pulled off oxygen via nasal canula. RN attempted to put it back on multiple attempts. Patient refused pushing RN's hands away.

## 2023-04-26 NOTE — ED Notes (Signed)
 This NT assisted nurse Kim with changing patient. Patient was wet. Patient has on a clean fitted sheet, a clean brief, a clean chux and a clean gown.

## 2023-04-26 NOTE — ED Notes (Addendum)
 Informed Sherryll Burger, MD of patient pulling off nasal canula this morning and refusing to keep it on, because patient has order for oxygen at 2L via [place]. Oxygen has maintained above 92% at room air.

## 2023-04-26 NOTE — ED Notes (Signed)
 Pt had a BM. Pt's brief was changed. Pt pulled up in the bed.

## 2023-04-26 NOTE — ED Notes (Signed)
 Patients oxygen saturation is 92% RA.

## 2023-04-26 NOTE — ED Notes (Signed)
 Mother of pt called and updated on plan of care.

## 2023-04-26 NOTE — Assessment & Plan Note (Signed)
-   Sepsis is manifested by initial tachycardia of 140 and tachypnea of 22. - The patient was admitted to a medical telemetry bed. - We will continue antibiotic therapy with IV Rocephin and Zithromax. - Will follow urine and blood cultures. - The patient will be hydrated with IV lactated ringer.

## 2023-04-26 NOTE — H&P (Addendum)
 [facility]   PATIENT NAME: [name]    MR#:  [id number]  DATE OF BIRTH:  [birth year]  DATE OF ADMISSION:  04/25/2023  PRIMARY CARE PHYSICIAN: Lauro Regulus, MD   Patient is coming from: Group Home  REQUESTING/REFERRING PHYSICIAN: Trinna Post, MD  CHIEF COMPLAINT:   Chief Complaint  Patient presents with   Aspiration    HISTORY OF PRESENT ILLNESS:  [name] is a 58 y.o. Caucasian female with medical history significant for cerebral palsy, urolithiasis, mental retardation, scoliosis and seizure disorder, who presented to the emergency room with acute onset of worsening dyspnea with suspected aspiration.  The patient vomited and had a choking episode group home after which oximetry dropped to the 80s.  She was agitated and was given IM Versed.  No reported diarrhea or melena or bright red bleeding per rectum.  No bilious vomitus or coffee-ground emesis.  No fever or chills.   ED Course: When the patient came to the ER, BP was 151/113 with heart rate 140 and respiratory to of 22.  Later on heart rate was 108 and respiratory rate 20.  Pulse oximetry was 92% on 4 L and later 94%.  Labs revealed a BUN of 25 with alk phos of 193 and albumin 3.3 with otherwise unremarkable CMP CBC with no normal.  INR is 1.2 with PT 15.3 and PTT 29.  UA was positive for UTI. EKG as reviewed by me : EKG showed sinus tachycardia with rate of 111 with slightly poor R wave progression. Imaging: Chest x-ray showed no acute cardiopulmonary disease.  The patient was given 2 g of IV Rocephin and 500 mg of IV Zithromax as well as 2 mg of IM Versed.  She will be admitted to a medical telemetry bed for further evaluation and management. PAST MEDICAL HISTORY:   Past Medical History:  Diagnosis Date   Acne    Cerebral palsy (HCC)    Kidney stones    Mental retardation    Myopia    Scoliosis    Seizure disorder (HCC)     PAST SURGICAL HISTORY:   Past Surgical History:  Procedure  Laterality Date   PARTIAL HIP ARTHROPLASTY     REVISION TOTAL KNEE ARTHROPLASTY      SOCIAL HISTORY:   Social History   Tobacco Use   Smoking status: Never   Smokeless tobacco: Never  Substance Use Topics   Alcohol use: No    FAMILY HISTORY:   Family History  Problem Relation Age of Onset   Breast cancer Neg Hx    Ovarian cancer Neg Hx    Colon cancer Neg Hx     DRUG ALLERGIES:   Allergies  Allergen Reactions   Amantadines Nausea And Vomiting    REVIEW OF SYSTEMS:   ROS As per history of present illness. All pertinent systems were reviewed above. Constitutional, HEENT, cardiovascular, respiratory, GI, GU, musculoskeletal, neuro, psychiatric, endocrine, integumentary and hematologic systems were reviewed and are otherwise negative/unremarkable except for positive findings mentioned above in the HPI.   MEDICATIONS AT HOME:   Prior to Admission medications   Medication Sig Start Date End Date Taking? Authorizing Provider  aspirin 325 MG EC tablet Take 325 mg by mouth daily.    [provider]  azelastine (OPTIVAR) 0.05 % ophthalmic solution Place 1 drop into both eyes 2 (two) times daily.    [provider]  baclofen (LIORESAL) 20 MG tablet Take 20 mg by mouth 3 (  three) times daily.    [provider]  carbamazepine (TEGRETOL XR) 200 MG 12 hr tablet Take 200 mg by mouth 2 (two) times daily.    [provider]  cetirizine (ZYRTEC) 10 MG tablet Take 10 mg by mouth daily as needed for allergies.    [provider]  dicyclomine (BENTYL) 10 MG capsule Take 10 mg by mouth 4 (four) times daily -  before meals and at bedtime.    [provider]  Docusate Sodium (DSS) 100 MG CAPS Take 100 mg by mouth 2 (two) times daily. 05/14/21   [provider]  furosemide (LASIX) 20 MG tablet Take 20 mg by mouth as needed.     [provider]  hydrOXYzine (ATARAX/VISTARIL) 25 MG tablet Take 25 mg by mouth 3 (three) times  daily. 09/06/19   [provider]  iron polysaccharides (NIFEREX) 150 MG capsule Take 1 capsule by mouth daily. 09/01/19   [provider]  levothyroxine (SYNTHROID) 75 MCG tablet Take 75 mcg by mouth daily before breakfast.    [provider]  LORazepam (ATIVAN) 1 MG tablet Take 1 mg by mouth as needed.     [provider]  metoCLOPramide (REGLAN) 5 MG tablet Take 5 mg by mouth 4 (four) times daily as needed for nausea or vomiting.    [provider]  mirtazapine (REMERON) 15 MG tablet Take 15 mg by mouth at bedtime.    [provider]  Multiple Vitamin (MULTIVITAMIN) capsule Take 1 capsule by mouth daily.    [provider]  OLANZapine (ZYPREXA) 5 MG tablet Take 5-10 mg by mouth 3 (three) times daily.    [provider]  omeprazole (PRILOSEC) 20 MG capsule Take 20 mg by mouth 2 (two) times daily.     [provider]  Skin Protectants, Misc. (EUCERIN) cream Apply topically as needed for dry skin.    [provider]  sucralfate (CARAFATE) 1 GM/10ML suspension Take 1 g by mouth 4 (four) times daily -  with meals and at bedtime.    [provider]  Sulfacetamide Sodium, Acne, 10 % LOTN Apply topically 2 (two) times daily.     [provider]  tamsulosin (FLOMAX) 0.4 MG CAPS capsule Take 0.4 mg by mouth at bedtime.    [provider]  UNABLE TO FIND Med Name: CBD Oil 500mg /41mL.  1 dropper under tongue BID.    [provider]      VITAL SIGNS:  Blood pressure 132/77, pulse 82, temperature 98.3 F (36.8 C), temperature source Axillary, resp. rate 16, weight 55.7 kg, SpO2 100%.  PHYSICAL EXAMINATION:  Physical Exam  GENERAL:  58 y.o.-year-old Caucasian female patient lying in the bed with no acute distress.  EYES: Pupils equal, round, reactive to light and accommodation. No scleral icterus. Extraocular muscles intact.  HEENT: Head atraumatic, normocephalic. Oropharynx and  nasopharynx clear.  NECK:  Supple, no jugular venous distention. No thyroid enlargement, no tenderness.  LUNGS: Mild diminished bibasal breath sounds with coarse breath sounds in both bases.  No use of accessory muscles of respiration.  CARDIOVASCULAR: Regular rate and rhythm, S1, S2 normal. No murmurs, rubs, or gallops.  ABDOMEN: Soft, nondistended, nontender. Bowel sounds present. No organomegaly or mass.  EXTREMITIES: No pedal edema, cyanosis, or clubbing.  NEUROLOGIC: Cranial nerves II through XII are intact. Muscle strength 5/5 in all extremities. Sensation intact. Gait not checked.  PSYCHIATRIC: The patient is alert and oriented x 3.  Normal affect and  good eye contact. SKIN: No obvious rash, lesion, or ulcer.   LABORATORY PANEL:   CBC Recent Labs  Lab 04/25/23 2137  WBC 6.4  HGB 12.8  HCT 40.2  PLT 175   ------------------------------------------------------------------------------------------------------------------  Chemistries  Recent Labs  Lab 04/25/23 2138  NA 142  K 3.8  CL 105  CO2 29  GLUCOSE 117*  BUN 25*  CREATININE 0.79  CALCIUM 8.9  AST 21  ALT 19  ALKPHOS 192*  BILITOT 0.5   ------------------------------------------------------------------------------------------------------------------  Cardiac Enzymes No results for input(s): "TROPONINI" in the last 168 hours. ------------------------------------------------------------------------------------------------------------------  RADIOLOGY:  DG Chest Port 1 View Result Date: 04/25/2023 CLINICAL DATA:  Sepsis, aspiration EXAM: PORTABLE CHEST 1 VIEW COMPARISON:  08/10/2022 FINDINGS: Single frontal view of the chest demonstrates a stable cardiac silhouette. Stable hiatal hernia. No acute airspace disease, effusion, or pneumothorax. Stable postsurgical changes from thoracolumbar fusion. IMPRESSION: 1. Stable chest, no acute process. Electronically Signed   By: Sharlet Salina M.D.   On: 04/25/2023 22:29       IMPRESSION AND PLAN:  Assessment and Plan: * Sepsis due to gram-negative UTI (HCC) - Sepsis is manifested by initial tachycardia of 140 and tachypnea of 22. - The patient was admitted to a medical telemetry bed. - We will continue antibiotic therapy with IV Rocephin and Zithromax. - Will follow urine and blood cultures. - The patient will be hydrated with IV lactated ringer.  Aspiration pneumonia (HCC) - This should be covered with IV Rocephin and Zithromax. - It is likely the culprit for her acute respiratory failure with hypoxia especially after vomiting. - Mucolytic therapy and bronchodilator therapy will be provided. - We will follow blood cultures as mentioned above.  Hypothyroidism - We will continue Synthroid.  Cerebral palsy (HCC) - Will continue baclofen.  GERD without esophagitis - We will continue Carafate and PPI therapy.  Anxiety and depression - We will continue Ativan, and Zyprexa as well as Remeron..       DVT prophylaxis: Lovenox.  Advanced Care Planning:  Code Status: The patient is DNR and DNI. Family Communication:  The plan of care was discussed in details with the patient (and family). I answered all questions. The patient agreed to proceed with the above mentioned plan. Further management will depend upon hospital course. Disposition Plan: Back to previous home environment Consults called: none.  All the records are reviewed and case discussed with ED provider.  Status is: Inpatient   At the time of the admission, it appears that the appropriate admission status for this patient is inpatient.  This is judged to be reasonable and necessary in order to provide the required intensity of service to ensure the patient's safety given the presenting symptoms, physical exam findings and initial radiographic and laboratory data in the context of comorbid conditions.  The patient requires inpatient status due to high intensity of service, high risk of  further deterioration and high frequency of surveillance required.  I certify that at the time of admission, it is my clinical judgment that the patient will require inpatient hospital care extending more than 2 midnights.                            Dispo: The patient is from: Group Home              Anticipated d/c is to: Group Home              Patient currently  is not medically stable to d/c.              Difficult to place patient: No  Hannah Beat M.D on 04/26/2023 at 6:09 AM  [facility]   From 7 PM-7 AM, contact night-coverage [email or web address]  CC: Primary care physician; Lauro Regulus, MD

## 2023-04-26 NOTE — Assessment & Plan Note (Signed)
-  We will continue Carafate and PPI therapy. ?

## 2023-04-26 NOTE — Assessment & Plan Note (Signed)
-   Will continue baclofen.

## 2023-04-26 NOTE — Assessment & Plan Note (Signed)
 -  We will continue Synthroid.

## 2023-04-26 NOTE — ED Provider Notes (Incomplete)
 [facility] Provider Note    Event Date/Time   First MD Initiated Contact with Patient 04/25/23 2121     (approximate)   History   Aspiration   HPI  [name] is a 58 year old female with history of cerebral palsy, seizure disorder presenting to the emergency department with concerns for aspiration.  Shortly after receiving her medications today, patient had onset of vomiting followed by coughing.  With EMS, sats in the low 80s.  Patient was agitated limiting ability to obtain vitals, but they were able to apply blow-by oxygen with improvement in sats to the 90s.       Physical Exam   Triage Vital Signs: ED Triage Vitals  Encounter Vitals Group     BP 04/25/23 2116 (!) 151/113     Systolic BP Percentile --      Diastolic BP Percentile --      Pulse Rate 04/25/23 2116 (!) 140     Resp 04/25/23 2116 (!) 22     Temp --      Temp src --      SpO2 04/25/23 2122 92 %     Weight --      Height --      Head Circumference --      Peak Flow --      Pain Score --      Pain Loc --      Pain Education --      Exclude from Growth Chart --     Most recent vital signs: Vitals:   04/25/23 2116 04/25/23 2122  BP: (!) 151/113   Pulse: (!) 140   Resp: (!) 22   SpO2:  92%     General: Awake, agitated during evaluation CV:  Tachycardic with heart rates up to the 140s but improving when left alone  Resp:  Fair air movement, lung sounds somewhat coarse at bilateral bases, tachypneic Abd:  Nondistended. Neuro:  Contractions and deficits consistent with known history of cerebral palsy   ED Results / Procedures / Treatments   Labs (all labs ordered are listed, but only abnormal results are displayed) Labs Reviewed  RESP PANEL BY RT-PCR (RSV, FLU A&B, COVID)  [id number]  CULTURE, BLOOD (ROUTINE X 2)  CULTURE, BLOOD (ROUTINE X 2)  LACTIC ACID, PLASMA  LACTIC ACID, PLASMA  COMPREHENSIVE METABOLIC PANEL WITH GFR  CBC WITH DIFFERENTIAL/PLATELET   URINALYSIS, W/ REFLEX TO CULTURE (INFECTION SUSPECTED)     EKG EKG independently reviewed interpreted by myself (ER attending) demonstrates:  EKG demonstrate sinus tachycardia rate of 111, PR 148, QRS 90, QTc 437, no acute ST changes  RADIOLOGY Imaging independently reviewed and interpreted by myself demonstrates:  CXR without focal consolidation  PROCEDURES:  Critical Care performed: Yes, see critical care procedure note(s)  CRITICAL CARE Performed by: Trinna Post   Total critical care time: 32 minutes  Critical care time was exclusive of separately billable procedures and treating other patients.  Critical care was necessary to treat or prevent imminent or life-threatening deterioration.  Critical care was time spent personally by me on the following activities: development of treatment plan with patient and/or surrogate as well as nursing, discussions with consultants, evaluation of patient's response to treatment, examination of patient, obtaining history from patient or surrogate, ordering and performing treatments and interventions, ordering and review of laboratory studies, ordering and review of radiographic studies, pulse oximetry and re-evaluation of patient's condition.   Procedures   MEDICATIONS ORDERED IN ED: Medications  cefTRIAXone (ROCEPHIN) 2 g in sodium chloride 0.9 % 100 mL IVPB (has no administration in time range)  azithromycin (ZITHROMAX) 500 mg in sodium chloride 0.9 % 250 mL IVPB (has no administration in time range)  midazolam (VERSED) injection 2 mg (has no administration in time range)     IMPRESSION / MDM / ASSESSMENT AND PLAN / ED COURSE  I reviewed the triage vital signs and the nursing notes.  Differential diagnosis includes, but is not limited to, aspiration pneumonia, pneumothorax, pneumonitis, UTI, anemia, electrolyte abnormality  Patient's presentation is most consistent with acute presentation with potential threat to life or bodily  function.  58 year old female presenting after suspected aspiration event tachycardic, tachypneic, with hypoxia.  Patient agitated on presentation limiting evaluation and ability to provide care.  She was given 2 mg of IM Versed with improvement.  Labs overall reassuring with normal white blood cell count at 6.4.  Urine is concerning for infection with many bacteria and 21-50 white blood cells.  Normal lactate.  Viral swab negative.  X-Ellesse Antenucci reassuring, but clinical history remains concerning for aspiration.  Vital signs did improve here.  Patient was able to be weaned from 4 L to 2 L, but unfortunately, after multiple attempts trying to wean her to room air she had recurrent hypoxia requiring persistent oxygen.  In the setting of this, do think she is appropriate for admission.  Case discussed with hospitalist team.  They will evaluate for anticipated admission.    FINAL CLINICAL IMPRESSION(S) / ED DIAGNOSES   Final diagnoses:  None     Rx / DC Orders   ED Discharge Orders     None        Note:  This document was prepared using Dragon voice recognition software and may include unintentional dictation errors.

## 2023-04-26 NOTE — Assessment & Plan Note (Signed)
-   This should be covered with IV Rocephin and Zithromax. - It is likely the culprit for her acute respiratory failure with hypoxia especially after vomiting. - Mucolytic therapy and bronchodilator therapy will be provided. - We will follow blood cultures as mentioned above.

## 2023-04-26 NOTE — ED Notes (Signed)
 RN was able to obtain axillary temperature. Patient was attempting to pull RN's hand away and grabbing at RN.

## 2023-04-26 NOTE — ED Notes (Addendum)
 Two RNs provided peri care and changed patients bedding and patient gown. Patient pulled up in bed and repositioned. Patient hit staff and attempted to bite. Patient had medium BM and medium void. Patient continues to pull off nasal canula patient O2: 93% RA. Sherryll Burger, MD made aware

## 2023-04-26 NOTE — Final Progress Note (Signed)
 Same day rounding progress note  Patient seen and examined while in the ED.  Waiting for the floor bed.  I agree with assessment and plan dictated by Dr. Mertie Moores.  Please see his dictated H&P for further details.  Sepsis present on admission likely due to gram-negative UTI Continue antibiotics and fluids.  Goals of care Will consult palliative care  Time spent 35 minutes

## 2023-04-26 NOTE — ED Notes (Addendum)
 Pt is on a puree diet at home.

## 2023-04-26 NOTE — ED Notes (Signed)
 RN in room to check on patient. Patient pulled off gown and monitoring leads. RN re-applied gown and monitoring leads.

## 2023-04-26 NOTE — ED Notes (Signed)
 RN was able to administer medications to patient. Patient pulled off 5 lead, BP cuff, hospital gown, and mitts. RN was able to re-apply mitts and re-apply BP cuff. Patient continued to hit RN when RN attempted to put back on 5-lead and hospital gown.

## 2023-04-27 DIAGNOSIS — E039 Hypothyroidism, unspecified: Secondary | ICD-10-CM | POA: Diagnosis not present

## 2023-04-27 DIAGNOSIS — G809 Cerebral palsy, unspecified: Secondary | ICD-10-CM | POA: Diagnosis not present

## 2023-04-27 DIAGNOSIS — J690 Pneumonitis due to inhalation of food and vomit: Secondary | ICD-10-CM | POA: Diagnosis not present

## 2023-04-27 DIAGNOSIS — A4150 Gram-negative sepsis, unspecified: Secondary | ICD-10-CM | POA: Diagnosis not present

## 2023-04-27 DIAGNOSIS — N390 Urinary tract infection, site not specified: Secondary | ICD-10-CM | POA: Diagnosis not present

## 2023-04-27 DIAGNOSIS — Z515 Encounter for palliative care: Secondary | ICD-10-CM | POA: Diagnosis not present

## 2023-04-27 DIAGNOSIS — T17918A Gastric contents in respiratory tract, part unspecified causing other injury, initial encounter: Secondary | ICD-10-CM | POA: Diagnosis not present

## 2023-04-27 MED ORDER — AMOXICILLIN-POT CLAVULANATE 500-125 MG PO TABS: 1.0000 | ORAL_TABLET | Freq: Two times a day (BID) | ORAL | 0 refills | Status: AC

## 2023-04-27 MED ORDER — SUCRALFATE 1 GM/10ML PO SUSP: 1.0000 g | Freq: Two times a day (BID) | ORAL | Status: AC

## 2023-04-27 MED ORDER — AZITHROMYCIN 500 MG PO TABS: 500.0000 mg | ORAL_TABLET | Freq: Every day | ORAL | Status: DC

## 2023-04-27 NOTE — Progress Notes (Signed)
                                                                                                                                                                                                           Daily Progress Note   Patient Name: [name]       Date: 04/27/2023 DOB: [birth year]  Age: 58 y.o. MRN#: [id number] Attending Physician: Delfino Lovett, MD Primary Care Physician: Lauro Regulus, MD Admit Date: 04/25/2023  Reason for Consultation/Follow-up: Establishing goals of care  HPI/Brief Hospital Review:  58 y.o. female  with past medical history of cerebral palsy, urolithiasis, scoliosis and seizure disorder admitted from [facility] on 04/25/2023 with acute onset of worsening dyspnea and acute hypoxia with concern for suspected aspiration.  Reportedly from facility staff Ms. Rousseau had a choking episode with associated hypoxia in the 80s which prompted transport to ED.   Chest x-ray revealed no acute cardiopulmonary disease.  Concern for acute UTI-empiric antibiotic therapy initiated has since been discontinued.  No further hypoxic events since admission.   Palliative medicine was consulted for assisting with goals of care conversations.  Subjective: Extensive chart review has been completed prior to meeting patient including labs, vital signs, imaging, progress notes, orders, and available advanced directive documents from current and previous encounters.    Visited with [name] at her bedside. She is more alert and interactive today but only able to communicate through grunting. No apparent signs of pain or discomfort.  Per chart, discharge planned for today back to group home. Signed DNR form placed on chart. Outpatient palliative care referral placed.  Care plan was discussed with primary team and nursing staff.   Thank you for allowing the Palliative Medicine Team to assist in the care of this patient.  Total time:  25 minutes  Time spent includes: Detailed  review of medical records (labs, imaging, vital signs), medically appropriate exam (mental status, respiratory, cardiac, skin), discussed with treatment team, counseling and educating patient, family and staff, documenting clinical information, medication management and coordination of care.  Leeanne Deed, DNP, AGNP-C Palliative Medicine   Please contact Palliative Medicine Team phone at [phone] for questions and concerns.

## 2023-04-27 NOTE — Progress Notes (Signed)
 Patient brought to the unit on stretcher by Nurse, transferred to bed and made comfortable. No obvious distress noted. Unable to orientated to unit due to developmental delay and in ability to communicate. Made comfortable in bed. Bed alarm on.

## 2023-04-27 NOTE — TOC Progression Note (Addendum)
 Transition of Care [facility]) - Progression Note    Patient Details  Name: [name] MRN: [id number] Date of Birth: [birth year]  Transition of Care [facility]) CM/SW Contact  Carolyn Quarry, RN Phone Number: 04/27/2023, 12:02 PM  Clinical Narrative:   04/27/23: RN CN contacted group home, [name] at [phone] and spoke to [name] who gave me the number for the RN [name] to contact regarding patient return at [phone], upon where RN CM left a confidential voice mail with call back number. Update provider. Patient has legal guardian, patient's mother, [name], at [phone]. Per  ED notes [name] wishes to be informed of all planning and discharge decisions and was informed on 04/26/23 per provider via Will contact when confirmed that group home can accept patient today and what transportation plan will be. Discharge Summary readied at 1041 am.   Note: Update: Order noted for ambulatory palliative to follow as outpatient The liaison picked up on this before TOC contacted them and confirmed in their progress note. [name] returned call and can accept patient today. DC Summary and FL2 faxed to [phone], along with SLP consult notes. Facility with arrange for transportation.   [name] (Mother) (Legal Guardian) [phone] (Home Phone)\    Carolyn Cirri MSN RN CM  RN Case Manager [place]  Transitions of Care Direct Dial: [phone] (Weekends Only) [facility] Main Office Phone: [phone] [facility] Fax: [phone] [email or web address]        Expected Discharge Plan and Services         Expected Discharge Date: 04/27/23                                     Social Determinants of Health (SDOH) Interventions SDOH Screenings   Food Insecurity: Patient Unable To Answer (04/26/2023)  Housing: Patient Unable To Answer (04/26/2023)  Transportation Needs: Patient Unable To Answer (04/26/2023)  Utilities: Patient Unable To Answer (04/26/2023)  Financial  Resource Strain: Patient Unable To Answer (08/06/2022)   Received from [facility]  Social Connections: Unknown (04/26/2023)  Tobacco Use: Low Risk  (04/26/2023)    Readmission Risk Interventions     No data to display

## 2023-04-27 NOTE — NC FL2 (Addendum)
 [place] MEDICAID FL2 LEVEL OF CARE FORM     IDENTIFICATION  Patient Name: [name] Birthdate: 05/04/1965 Sex: female Admission Date (Current Location): 04/25/2023  [facility] and [place] Number:  Chiropodist and Address:  [facility], [address] [place], [place] [address]      Provider Number: [id number]  Attending Physician Name and Address:  Delfino Lovett, MD  Relative Name and Phone Number:  Vella Raring (Mother)  [phone] [facility] Phone) LEGAL Guardian    Current Level of Care: Hospital Recommended Level of Care: [facility], [facility] Prior Approval Number:    Date Approved/Denied:   PASRR Number:    Discharge Plan: Home    Current Diagnoses: Patient Active Problem List   Diagnosis Date Noted   Aspiration pneumonia (HCC) 04/26/2023   Anxiety and depression 04/26/2023   GERD without esophagitis 04/26/2023   Aspiration of gastric contents 04/26/2023   Hypoxia 04/26/2023   Sepsis due to gram-negative UTI (HCC) 08/18/2022   Acute respiratory failure with hypoxia (HCC) 08/18/2022   COVID-19 virus infection 05/24/2022   Cerebral palsy (HCC) 05/24/2022   Seizure disorder (HCC) 05/24/2022   UTI (urinary tract infection) 05/24/2022   Thrombocytopenia (HCC) 05/24/2022   Nausea & vomiting 05/24/2022   Hypothyroidism 05/24/2022   Pleural effusion on left 05/24/2022   Liver lesion 05/24/2022   Intellectual disability 02/21/2015   Seizure disorder, primary (HCC) 02/21/2015   Menopause 02/21/2015   Abnormal liver function tests 02/21/2015    Orientation RESPIRATION BLADDER Height & Weight     Self  Normal (Thin Liquid: Within functional limits  Presentation: Spoon;Straw (pt required cues to drink from straw instead of biting it)  Oral care BID. See SLP consult notes.) Incontinent Weight: 55.7 kg Height:     BEHAVIORAL SYMPTOMS/MOOD NEUROLOGICAL BOWEL NUTRITION STATUS      Incontinent Diet (Thin Liquid: Within  functional limits Presentation: Spoon;Straw (pt required cues to drink from straw instead of biting it) Oral care BID. See SLP consult notes.)  AMBULATORY STATUS COMMUNICATION OF NEEDS Skin    (baseline prior to admission per provider)  (Complex cognitive issues.) Skin abrasions (RIGHT posterior hand)                       Personal Care Assistance Level of Assistance   (baseline prior to admission per provider)           Functional Limitations Info             SPECIAL CARE FACTORS FREQUENCY                       Contractures Contractures Info: Present (BIlateral LE)    Additional Factors Info  Code Status, Allergies Code Status Info: DNR Limited Allergies Info: Amantadines           Current Medications (04/27/2023):  This is the current hospital active medication list Current Facility-Administered Medications  Medication Dose Route Frequency Provider Last Rate Last Admin   acetaminophen (TYLENOL) tablet 650 mg  650 mg Oral Q6H PRN Mansy, Jan A, MD       Or   acetaminophen (TYLENOL) suppository 650 mg  650 mg Rectal Q6H PRN Mansy, Vernetta Honey, MD       azithromycin (ZITHROMAX) tablet 500 mg  500 mg Oral QHS Tressie Ellis, RPH       baclofen (LIORESAL) tablet 20 mg  20 mg Oral TID Mansy, Vernetta Honey, MD  20 mg at 04/27/23 1002   carbamazepine (TEGRETOL XR) 12 hr tablet 200 mg  200 mg Oral BID Mansy, Jan A, MD   200 mg at 04/27/23 1017   cefTRIAXone (ROCEPHIN) 2 g in sodium chloride 0.9 % 100 mL IVPB  2 g Intravenous Q24H Mansy, Jan A, MD 200 mL/hr at 04/26/23 2223 2 g at 04/26/23 2223   dicyclomine (BENTYL) capsule 10 mg  10 mg Oral TID AC & HS Mansy, Jan A, MD   10 mg at 04/27/23 1331   docusate sodium (COLACE) capsule 100 mg  100 mg Oral BID Mansy, Jan A, MD   100 mg at 04/27/23 1000   enoxaparin (LOVENOX) injection 40 mg  40 mg Subcutaneous Q24H Mansy, Jan A, MD   40 mg at 04/27/23 1003   furosemide (LASIX) tablet 20 mg  20 mg Oral PRN Mansy, Jan A, MD        hydrocerin (EUCERIN) cream 1 Application  1 Application Topical PRN Mansy, Jan A, MD       hydrOXYzine (ATARAX) tablet 25 mg  25 mg Oral TID Mansy, Jan A, MD   25 mg at 04/27/23 1000   iron polysaccharides (NIFEREX) capsule 150 mg  150 mg Oral Daily Mansy, Jan A, MD   150 mg at 04/27/23 1003   ketotifen (ZADITOR) 0.035 % ophthalmic solution 1 drop  1 drop Both Eyes BID Mansy, Jan A, MD   1 drop at 04/27/23 1017   levothyroxine (SYNTHROID) tablet 75 mcg  75 mcg Oral Q0600 Mansy, Jan A, MD   75 mcg at 04/27/23 0555   loratadine (CLARITIN) tablet 10 mg  10 mg Oral Daily Mansy, Jan A, MD   10 mg at 04/27/23 1002   LORazepam (ATIVAN) injection 1 mg  1 mg Intravenous Q4H PRN Delfino Lovett, MD   1 mg at 04/26/23 1324   magnesium hydroxide (MILK OF MAGNESIA) suspension 30 mL  30 mL Oral Daily PRN Mansy, Jan A, MD       methylPREDNISolone sodium succinate (SOLU-MEDROL) 40 mg/mL injection 40 mg  40 mg Intravenous Daily Sherryll Burger, Vipul, MD   40 mg at 04/27/23 1003   mirtazapine (REMERON) tablet 15 mg  15 mg Oral QHS Mansy, Jan A, MD   15 mg at 04/26/23 2116   multivitamin with minerals tablet 1 tablet  1 tablet Oral Daily Mansy, Jan A, MD   1 tablet at 04/27/23 1002   OLANZapine (ZYPREXA) injection 5 mg  5 mg Intramuscular Once PRN Trinna Post, MD       OLANZapine (ZYPREXA) tablet 5 mg  5 mg Oral 2 times per day Tressie Ellis, RPH   5 mg at 04/27/23 1003   And   OLANZapine (ZYPREXA) tablet 10 mg  10 mg Oral QHS Tressie Ellis, RPH   10 mg at 04/26/23 2223   ondansetron (ZOFRAN) tablet 4 mg  4 mg Oral Q6H PRN Mansy, Jan A, MD       Or   ondansetron [facility]) injection 4 mg  4 mg Intravenous Q6H PRN Mansy, Jan A, MD       pantoprazole (PROTONIX) EC tablet 40 mg  40 mg Oral Daily Mansy, Jan A, MD   40 mg at 04/27/23 1000   sucralfate (CARAFATE) 1 GM/10ML suspension 1 g  1 g Oral TID WC & HS Mansy, Jan A, MD   1 g at 04/27/23 1331   tamsulosin (FLOMAX) capsule 0.4 mg  0.4 mg Oral QHS Mansy,  Jan A, MD   0.4 mg at  04/26/23 2117   traZODone (DESYREL) tablet 25 mg  25 mg Oral QHS PRN Mansy, Jan A, MD   25 mg at 04/26/23 2116     Discharge Medications: Please see discharge summary for a list of discharge medications.  Relevant Imaging Results:  Relevant Lab Results:   Additional Information SSN# [id number]  Bing Quarry, RN

## 2023-04-27 NOTE — Plan of Care (Signed)
  Problem: Clinical Measurements: Goal: Diagnostic test results will improve Outcome: Progressing   Problem: Respiratory: Goal: Ability to maintain adequate ventilation will improve Outcome: Progressing   Problem: Activity: Goal: Risk for activity intolerance will decrease Outcome: Progressing   Problem: Nutrition: Goal: Adequate nutrition will be maintained Outcome: Progressing   Problem: Coping: Goal: Level of anxiety will decrease Outcome: Progressing   Problem: Pain Managment: Goal: General experience of comfort will improve and/or be controlled Outcome: Progressing   Problem: Safety: Goal: Ability to remain free from injury will improve Outcome: Progressing   Problem: Skin Integrity: Goal: Risk for impaired skin integrity will decrease Outcome: Progressing

## 2023-04-27 NOTE — Plan of Care (Signed)
  Problem: Fluid Volume: Goal: Hemodynamic stability will improve Outcome: Progressing   Problem: Respiratory: Goal: Ability to maintain adequate ventilation will improve Outcome: Progressing   

## 2023-04-27 NOTE — Evaluation (Signed)
 Clinical/Bedside Swallow Evaluation Patient Details  Name: [name] MRN: [id number] Date of Birth: [birth year]  Today's Date: 04/27/2023 Time: SLP Start Time (ACUTE ONLY): 1010 SLP Stop Time (ACUTE ONLY): 1018 SLP Time Calculation (min) (ACUTE ONLY): 8 min  Past Medical History:  Past Medical History:  Diagnosis Date   Acne    Cerebral palsy (HCC)    Kidney stones    Mental retardation    Myopia    Scoliosis    Seizure disorder (HCC)    Past Surgical History:  Past Surgical History:  Procedure Laterality Date   PARTIAL HIP ARTHROPLASTY     REVISION TOTAL KNEE ARTHROPLASTY     HPI:  Pt is a 58 year old female who presented to [facility] ED via [facility] for aspirating on pt's own vomit. She is from [employer] [employer] in [place]. Pt vomited shortly after taking her evening meds and aspirated on 04/25/2023. Pt is nonverbal d/t history of cerebral palsy, seizure disorder. DG Chest on 04/25/2023 revealed stable chest, no acute process.    Assessment / Plan / Recommendation  Clinical Impression  Pt presents with bilateral mittens in place, is awake/alert, appears oriented to herself only. She has history of cognitive impairments and was total care prior to admission. During this evaluation, she is able to focus attention on this writer and her nurse and while she is intermittently verbal, her speech was not intelligible and didn't appear communicative. At this time, skilled ST services are not indicated for cognitive communication therapy as pt is not able to participate effectively and suspect that pt is at or nearing baseline. SLP Visit Diagnosis: Cognitive communication deficit (R41.841)    Aspiration Risk  Mild aspiration risk    Diet Recommendation Dysphagia 1 (Puree);Thin liquid    Liquid Administration via: Cup;Straw Medication Administration: Crushed with puree Supervision: Full supervision/cueing for compensatory strategies;Staff to assist with self feeding Compensations:  Minimize environmental distractions;Slow rate;Small sips/bites Postural Changes: Seated upright at 90 degrees;Remain upright for at least 30 minutes after po intake    Other  Recommendations Oral Care Recommendations: Oral care BID    Recommendations for follow up therapy are one component of a multi-disciplinary discharge planning process, led by the attending physician.  Recommendations may be updated based on patient status, additional functional criteria and insurance authorization.  Follow up Recommendations No SLP follow up      Assistance Recommended at Discharge Frequent or constant Supervision/Assistance  Functional Status Assessment Patient has not had a recent decline in their functional status  Frequency and Duration   N/A         Prognosis   N/A     Swallow Study   General Date of Onset: 04/25/23 HPI: Pt is a 58 year old female who presented to [facility] ED via [facility] for aspirating on pt's own vomit. She is from [employer] in [place]. Pt vomited shortly after taking her evening meds and aspirated on 04/25/2023. Pt is nonverbal d/t history of cerebral palsy, seizure disorder. DG Chest on 04/25/2023 revealed stable chest, no acute process. Type of Study: Bedside Swallow Evaluation Previous Swallow Assessment: none in chart Diet Prior to this Study: Dysphagia 3 (mechanical soft);Thin liquids (Level 0) Temperature Spikes Noted: No Respiratory Status: Room air History of Recent Intubation: No Behavior/Cognition: Alert;Distractible;Doesn't follow directions Oral Cavity Assessment: Within Functional Limits Oral Care Completed by SLP: No Self-Feeding Abilities: Total assist Patient Positioning: Upright in bed Baseline Vocal Quality: Normal Volitional Cough: Cognitively unable to elicit  Volitional Swallow: Unable to elicit    Oral/Motor/Sensory Function Overall Oral Motor/Sensory Function:  (unable to complete formal assessment)   Ice Chips Ice chips: Within  functional limits Presentation: Spoon   Thin Liquid Thin Liquid: Within functional limits Presentation: Spoon;Straw (pt required cues to drink from straw instead of biting it)    Nectar Thick Nectar Thick Liquid: Not tested   Honey Thick Honey Thick Liquid: Not tested   Puree Puree: Within functional limits Presentation: Spoon Other Comments:  (pt with trace oral residue which suspect is baseline for pt)   Solid     Solid: Not tested Other Comments:  (d/t report in chart (04/26/2023) of puree diet at baseline)     [name] B. Dreama Saa, M.S., CCC-SLP, Tree surgeon Certified Brain Injury Specialist [facility]  [facility] Rehabilitation Services Office [phone] Ascom [phone] Fax [phone]

## 2023-04-27 NOTE — Progress Notes (Signed)
 [facility] Palliative Care Referral  Received new referral from [facility] for outpatient Palliative Care at discharge.  She said a referral had been put in, but this RN did not receive anything from TOC.  Patient resides at [facility].  Palliative referral sent to our referral intake department.  Norris Cross, RN Nurse Liaison [phone]

## 2023-04-27 NOTE — Evaluation (Signed)
 Clinical/Bedside Swallow Evaluation Patient Details  Name: [name] MRN: [id number] Date of Birth: [birth year]  Today's Date: 04/27/2023 Time: SLP Start Time (ACUTE ONLY): 1002 SLP Stop Time (ACUTE ONLY): 1010 SLP Time Calculation (min) (ACUTE ONLY): 8 min  Past Medical History:  Past Medical History:  Diagnosis Date   Acne    Cerebral palsy (HCC)    Kidney stones    Mental retardation    Myopia    Scoliosis    Seizure disorder (HCC)    Past Surgical History:  Past Surgical History:  Procedure Laterality Date   PARTIAL HIP ARTHROPLASTY     REVISION TOTAL KNEE ARTHROPLASTY     HPI:  Pt is a 58 year old female who presented to [facility] ED via [facility] for aspirating on pt's own vomit. She is from [employer] in [place]. Pt vomited shortly after taking her evening meds and aspirated on 04/25/2023. Pt is nonverbal d/t history of cerebral palsy, seizure disorder. DG Chest on 04/25/2023 revealed stable chest, no acute process.    Assessment / Plan / Recommendation  Clinical Impression  ST consulted with chart review revealing nursing note date 04/26/2023 informs "pt on puree diet at home."  Uncertain of any other swallowing deficits. Pt currently on dysphagia 3 diet with breakfast tray present but moved to the side.   Pt presents as awake, alert, able to display focused attention to this writer, her nurse (who was present) and liquid/po trials. Pt presents with adequate oropharyngeal abilities when consuming ice chips, thin liquids via spoon, cup and straw (though she benefits from verbal cues to not bite the straw) and puree. She does have trace oral residue which I suspect is baseline and didn't accumulate or pose an increase risk of aspiration. Pt's nurse also provided her medicines crushed in puree during this evaluation. She displayed functional ability when consuming as well. At this time, recommend dysphagia 1 diet with thin liquids via cup or straw, medicine crushed in  puree.   At this time, pt appears at baseline without need for further skilled ST services.     SLP Visit Diagnosis: Dysphagia, unspecified (R13.10)    Aspiration Risk  Mild aspiration risk    Diet Recommendation Dysphagia 1 (Puree);Thin liquid    Liquid Administration via: Cup;Straw Medication Administration: Crushed with puree Supervision: Full supervision/cueing for compensatory strategies;Staff to assist with self feeding Compensations: Minimize environmental distractions;Slow rate;Small sips/bites Postural Changes: Seated upright at 90 degrees;Remain upright for at least 30 minutes after po intake    Other  Recommendations Oral Care Recommendations: Oral care BID    Recommendations for follow up therapy are one component of a multi-disciplinary discharge planning process, led by the attending physician.  Recommendations may be updated based on patient status, additional functional criteria and insurance authorization.  Follow up Recommendations No SLP follow up      Assistance Recommended at Discharge  N/A  Functional Status Assessment Patient has not had a recent decline in their functional status  Frequency and Duration   N/A         Prognosis   N/A     Swallow Study   General Date of Onset: 04/25/23 HPI: Pt is a 58 year old female who presented to [facility] ED via [facility] for aspirating on pt's own vomit. She is from [employer] in [place]. Pt vomited shortly after taking her evening meds and aspirated on 04/25/2023. Pt is nonverbal d/t history of cerebral palsy, seizure disorder. DG  Chest on 04/25/2023 revealed stable chest, no acute process. Type of Study: Bedside Swallow Evaluation Previous Swallow Assessment: none in chart Diet Prior to this Study: Dysphagia 3 (mechanical soft);Thin liquids (Level 0) Temperature Spikes Noted: No Respiratory Status: Room air History of Recent Intubation: No Behavior/Cognition: Alert;Distractible;Doesn't follow  directions Oral Cavity Assessment: Within Functional Limits Oral Care Completed by SLP: No Self-Feeding Abilities: Total assist Patient Positioning: Upright in bed Baseline Vocal Quality: Normal Volitional Cough: Cognitively unable to elicit Volitional Swallow: Unable to elicit    Oral/Motor/Sensory Function Overall Oral Motor/Sensory Function:  (unable to complete formal assessment, suspect at baseline, no focal deficits observed)   Ice Chips Ice chips: Within functional limits Presentation: Spoon   Thin Liquid Thin Liquid: Within functional limits Presentation: Spoon;Straw (pt required cues to drink from straw instead of biting it)    Nectar Thick Nectar Thick Liquid: Not tested   Honey Thick Honey Thick Liquid: Not tested   Puree Puree: Within functional limits Presentation: Spoon Other Comments:  (pt with trace oral residue which suspect is baseline for pt)   Solid     Solid: Not tested Other Comments:  (d/t report in chart (04/26/2023) of puree diet at baseline)     Amayah Staheli B. Dreama Saa, M.S., CCC-SLP, Tree surgeon Certified Brain Injury Specialist [facility]  [facility] Rehabilitation Services Office [phone] Ascom [phone] Fax [phone]

## 2023-04-27 NOTE — Discharge Summary (Signed)
 Physician Discharge Summary   Patient: [name] MRN: [id number] DOB: 1965/10/26  Admit date:     04/25/2023  Discharge date: 04/27/23  Discharge Physician: Delfino Lovett   PCP: Lauro Regulus, MD   Recommendations at discharge:   Dysphagia 1 diet with thin liquids, medicine crushed in puree per ST recommendation Aspiration Precautions F/up with outpt providers as requested  Discharge Diagnoses: Principal Problem:   Sepsis due to gram-negative UTI [facility]) Active Problems:   Aspiration pneumonia (HCC)   Hypothyroidism   Cerebral palsy (HCC)   Anxiety and depression   GERD without esophagitis   Aspiration of gastric contents   Hypoxia  Hospital Course: Assessment and Plan: * Sepsis due to gram-negative UTI (HCC) &/PNEUMONIA - Sepsis is manifested by initial tachycardia of 140 and tachypnea of 22. - Rapid improvement with Abx. Sepsis treated  Aspiration pneumonia (HCC) - remains afebrile. Likely an aspiration event. - It is likely the culprit for her acute respiratory failure with hypoxia especially after vomiting. - back to baseline. Finish course of Abx - blood c/s neg. - ST evaluated and recommends dysphagia 1 diet with thin liquids, medicine crushed in puree, strict aspiration precautions  Hypothyroidism - continue Synthroid.  Cerebral palsy (HCC) - continue baclofen.  GERD without esophagitis - continue Carafate (adjusted dose to bid) and PPI therapy.  Anxiety and depression -  continue Ativan, and Zyprexa as well as Remeron..          Disposition: Group home/[facility] with Palliative care to follow Diet recommendation:  Discharge Diet Orders (From admission, onward)     Start     Ordered   04/27/23 0000  Diet - low sodium heart healthy        04/27/23 1032           Dysphagia type 1 thin Liquid DISCHARGE MEDICATION: Allergies as of 04/27/2023       Reactions   Amantadines Nausea And Vomiting        Medication List     STOP  taking these medications    UNABLE TO FIND       TAKE these medications    amoxicillin-clavulanate 500-125 MG tablet Commonly known as: Augmentin Take 1 tablet by mouth 2 (two) times daily for 5 days.   aspirin EC 325 MG tablet Take 325 mg by mouth daily.   azelastine 0.05 % ophthalmic solution Commonly known as: OPTIVAR Place 1 drop into both eyes 2 (two) times daily.   baclofen 20 MG tablet Commonly known as: LIORESAL Take 20 mg by mouth 3 (three) times daily.   carbamazepine 200 MG 12 hr tablet Commonly known as: TEGRETOL XR Take 200 mg by mouth 2 (two) times daily.   cetirizine 10 MG tablet Commonly known as: ZYRTEC Take 10 mg by mouth daily as needed for allergies.   dicyclomine 10 MG capsule Commonly known as: BENTYL Take 10 mg by mouth 4 (four) times daily -  before meals and at bedtime.   DSS 100 MG Caps Take 100 mg by mouth 2 (two) times daily.   eucerin cream Apply topically as needed for dry skin.   furosemide 20 MG tablet Commonly known as: LASIX Take 20 mg by mouth as needed.   hydrOXYzine 25 MG tablet Commonly known as: ATARAX Take 25 mg by mouth 3 (three) times daily.   iron polysaccharides 150 MG capsule Commonly known as: NIFEREX Take 1 capsule by mouth daily.   levothyroxine 75 MCG tablet Commonly known as: SYNTHROID  Take 75 mcg by mouth daily before breakfast.   LORazepam 1 MG tablet Commonly known as: ATIVAN Take 1 mg by mouth as needed.   meloxicam 15 MG tablet Commonly known as: MOBIC Take 15 mg by mouth daily.   metoCLOPramide 5 MG tablet Commonly known as: REGLAN Take 5 mg by mouth 4 (four) times daily as needed for nausea or vomiting.   mirtazapine 15 MG tablet Commonly known as: REMERON Take 15 mg by mouth at bedtime.   multivitamin capsule Take 1 capsule by mouth daily.   OLANZapine 5 MG tablet Commonly known as: ZYPREXA Take 5 mg by mouth in the morning, at noon, in the evening, and at bedtime.   omeprazole  20 MG capsule Commonly known as: PRILOSEC Take 20 mg by mouth 2 (two) times daily.   sucralfate 1 GM/10ML suspension Commonly known as: CARAFATE Take 10 mLs (1 g total) by mouth 2 (two) times daily. What changed: when to take this   Sulfacetamide Sodium (Acne) 10 % Lotn Apply topically 2 (two) times daily.   tamsulosin 0.4 MG Caps capsule Commonly known as: FLOMAX Take 0.4 mg by mouth at bedtime.        Follow-up Information     Lauro Regulus, MD Follow up in 1 week(s).   Specialty: Internal Medicine Why: [facility] Discharge F/UP Contact information: [address] [employer] [place] [address] [phone]                Discharge Exam: Ceasar Mons Weights   04/26/23 0424  Weight: 55.7 kg   GENERAL:  58 y.o.-year-old Caucasian female patient lying in the bed with no acute distress.  EYES: Pupils equal, round, reactive to light and accommodation. No scleral icterus. Extraocular muscles intact.  HEENT: Head atraumatic, normocephalic. Oropharynx and nasopharynx clear.  NECK:  Supple, no jugular venous distention. No thyroid enlargement, no tenderness.  LUNGS: Mild diminished bibasal breath sounds with coarse breath sounds in both bases.  No use of accessory muscles of respiration.  CARDIOVASCULAR: Regular rate and rhythm, S1, S2 normal. No murmurs, rubs, or gallops.  ABDOMEN: Soft, nondistended, nontender. Bowel sounds present. No organomegaly or mass.  EXTREMITIES: No pedal edema, cyanosis, or clubbing.  NEUROLOGIC: non-focal. Awake and alert. PSYCHIATRIC: The patient is alert and awake.  SKIN: No obvious rash, lesion, or ulcer.   Condition at discharge: fair  The results of significant diagnostics from this hospitalization (including imaging, microbiology, ancillary and laboratory) are listed below for reference.   Imaging Studies: DG Chest Port 1 View Result Date: 04/25/2023 CLINICAL DATA:  Sepsis, aspiration EXAM: PORTABLE CHEST  1 VIEW COMPARISON:  08/10/2022 FINDINGS: Single frontal view of the chest demonstrates a stable cardiac silhouette. Stable hiatal hernia. No acute airspace disease, effusion, or pneumothorax. Stable postsurgical changes from thoracolumbar fusion. IMPRESSION: 1. Stable chest, no acute process. Electronically Signed   By: Sharlet Salina M.D.   On: 04/25/2023 22:29    Microbiology: Results for orders placed or performed during the hospital encounter of 04/25/23  Resp panel by RT-PCR (RSV, Flu A&B, Covid) Anterior Nasal Swab     Status: None   Collection Time: 04/25/23  9:37 PM   Specimen: Anterior Nasal Swab  Result Value Ref Range Status   SARS Coronavirus 2 by RT PCR NEGATIVE NEGATIVE Final    Comment: (NOTE) SARS-CoV-2 target nucleic acids are NOT DETECTED.  The SARS-CoV-2 RNA is generally detectable in upper respiratory specimens during the acute phase of infection.  The lowest concentration of SARS-CoV-2 viral copies this assay can detect is 138 copies/mL. A negative result does not preclude SARS-Cov-2 infection and should not be used as the sole basis for treatment or other patient management decisions. A negative result may occur with  improper specimen collection/handling, submission of specimen other than nasopharyngeal swab, presence of viral mutation(s) within the areas targeted by this assay, and inadequate number of viral copies(<138 copies/mL). A negative result must be combined with clinical observations, patient history, and epidemiological information. The expected result is Negative.  Fact Sheet for Patients:  BloggerCourse.com  Fact Sheet for Healthcare Providers:  SeriousBroker.it  This test is no t yet approved or cleared by the [place] FDA and  has been authorized for detection and/or diagnosis of SARS-CoV-2 by FDA under an Emergency Use Authorization (EUA). This EUA will remain  in effect (meaning this test  can be used) for the duration of the COVID-19 declaration under Section 564(b)(1) of the Act, 21 U.S.C.section 360bbb-3(b)(1), unless the authorization is terminated  or revoked sooner.       Influenza A by PCR NEGATIVE NEGATIVE Final   Influenza B by PCR NEGATIVE NEGATIVE Final    Comment: (NOTE) The Xpert Xpress SARS-CoV-2/FLU/RSV plus assay is intended as an aid in the diagnosis of influenza from Nasopharyngeal swab specimens and should not be used as a sole basis for treatment. Nasal washings and aspirates are unacceptable for Xpert Xpress SARS-CoV-2/FLU/RSV testing.  Fact Sheet for Patients: BloggerCourse.com  Fact Sheet for Healthcare Providers: SeriousBroker.it  This test is not yet approved or cleared by the [place] FDA and has been authorized for detection and/or diagnosis of SARS-CoV-2 by FDA under an Emergency Use Authorization (EUA). This EUA will remain in effect (meaning this test can be used) for the duration of the COVID-19 declaration under Section 564(b)(1) of the Act, 21 U.S.C. section 360bbb-3(b)(1), unless the authorization is terminated or revoked.     Resp Syncytial Virus by PCR NEGATIVE NEGATIVE Final    Comment: (NOTE) Fact Sheet for Patients: BloggerCourse.com  Fact Sheet for Healthcare Providers: SeriousBroker.it  This test is not yet approved or cleared by the [place] FDA and has been authorized for detection and/or diagnosis of SARS-CoV-2 by FDA under an Emergency Use Authorization (EUA). This EUA will remain in effect (meaning this test can be used) for the duration of the COVID-19 declaration under Section 564(b)(1) of the Act, 21 U.S.C. section 360bbb-3(b)(1), unless the authorization is terminated or revoked.  Performed at [facility], [address] [place], [place] [address]   Blood Culture (routine x 2)      Status: None (Preliminary result)   Collection Time: 04/25/23  9:37 PM   Specimen: BLOOD RIGHT HAND  Result Value Ref Range Status   Specimen Description BLOOD RIGHT HAND  Final   Special Requests   Final    BOTTLES DRAWN AEROBIC AND ANAEROBIC Blood Culture adequate volume   Culture   Final    NO GROWTH 2 DAYS Performed at [facility], [address], [place], [place] [address]    Report Status PENDING  Incomplete  Blood Culture (routine x 2)     Status: None (Preliminary result)   Collection Time: 04/25/23  9:38 PM   Specimen: BLOOD LEFT HAND  Result Value Ref Range Status   Specimen Description BLOOD LEFT HAND  Final   Special Requests   Final    BOTTLES DRAWN AEROBIC AND ANAEROBIC Blood Culture adequate volume  Culture   Final    NO GROWTH 2 DAYS Performed at [facility], [address] [place], [place] [address]    Report Status PENDING  Incomplete    Labs: CBC: Recent Labs  Lab 04/25/23 2137  WBC 6.4  NEUTROABS 4.9  HGB 12.8  HCT 40.2  MCV 93.5  PLT 175   Basic Metabolic Panel: Recent Labs  Lab 04/25/23 2138  NA 142  K 3.8  CL 105  CO2 29  GLUCOSE 117*  BUN 25*  CREATININE 0.79  CALCIUM 8.9   Liver Function Tests: Recent Labs  Lab 04/25/23 2138  AST 21  ALT 19  ALKPHOS 192*  BILITOT 0.5  PROT 6.5  ALBUMIN 3.3*   CBG: No results for input(s): "GLUCAP" in the last 168 hours.  Discharge time spent: greater than 30 minutes.  Signed: Delfino Lovett, MD [facility] 04/27/2023

## 2023-04-28 LAB — URINE CULTURE: Culture: >=100000 — AB

## 2023-04-30 LAB — CULTURE, BLOOD (ROUTINE X 2)
Culture: NO GROWTH
Culture: NO GROWTH
Special Requests: ADEQUATE
Special Requests: ADEQUATE

## 2023-05-06 ENCOUNTER — Ambulatory Visit (INDEPENDENT_AMBULATORY_CARE_PROVIDER_SITE_OTHER)

## 2023-05-06 ENCOUNTER — Ambulatory Visit
Admission: EM | Admit: 2023-05-06 | Discharge: 2023-05-06 | Disposition: A | Discharge status: Home / Self Care | Attending: Emergency Medicine | Admitting: Emergency Medicine

## 2023-05-06 DIAGNOSIS — T17918D Gastric contents in respiratory tract, part unspecified causing other injury, subsequent encounter: Secondary | ICD-10-CM | POA: Diagnosis not present

## 2023-05-06 DIAGNOSIS — I10 Essential (primary) hypertension: Secondary | ICD-10-CM | POA: Insufficient documentation

## 2023-05-06 DIAGNOSIS — J181 Lobar pneumonia, unspecified organism: Secondary | ICD-10-CM | POA: Diagnosis not present

## 2023-05-06 DIAGNOSIS — J189 Pneumonia, unspecified organism: Secondary | ICD-10-CM | POA: Diagnosis not present

## 2023-05-06 LAB — RESP PANEL BY RT-PCR (FLU A&B, COVID) ARPGX2
Influenza A by PCR: NEGATIVE
Influenza B by PCR: NEGATIVE
SARS Coronavirus 2 by RT PCR: NEGATIVE

## 2023-05-06 MED ORDER — AMOXICILLIN-POT CLAVULANATE 875-125 MG PO TABS: 1.0000 | ORAL_TABLET | Freq: Two times a day (BID) | ORAL | 0 refills | Status: AC

## 2023-05-06 MED ORDER — AZITHROMYCIN 250 MG PO TABS: 250.0000 mg | ORAL_TABLET | Freq: Every day | ORAL | 0 refills | Status: DC

## 2023-05-06 NOTE — ED Provider Notes (Signed)
 [facility]    CSN: [id number] Arrival date & time: 05/06/23  1510      History   Chief Complaint Chief Complaint  Patient presents with   Hypertension    HPI [name] is a 58 y.o. female.   HPI  58 year old female with past medical history significant for seizure disorder, scoliosis, myopia, intellectual disability, CP, and renal stones presents from a group home for evaluation of dry heaving and elevated blood pressure.  She is here with the administrator of the group home who was advised to come pick her up from the day program where she was experiencing dry heaving and had a measured blood pressure of 164/150.  Patient has no documented history of hypertension.  She was hospitalized 11 days ago for aspiration pneumonia.  The group home administrator denies any runny nose, nasal congestion, or cough.  Past Medical History:  Diagnosis Date   Acne    Cerebral palsy (HCC)    Kidney stones    Mental retardation    Myopia    Scoliosis    Seizure disorder [facility])     Patient Active Problem List   Diagnosis Date Noted   Aspiration pneumonia (HCC) 04/26/2023   Anxiety and depression 04/26/2023   GERD without esophagitis 04/26/2023   Aspiration of gastric contents 04/26/2023   Hypoxia 04/26/2023   Sepsis due to gram-negative UTI (HCC) 08/18/2022   Acute respiratory failure with hypoxia (HCC) 08/18/2022   COVID-19 virus infection 05/24/2022   Cerebral palsy (HCC) 05/24/2022   Seizure disorder (HCC) 05/24/2022   UTI (urinary tract infection) 05/24/2022   Thrombocytopenia (HCC) 05/24/2022   Nausea & vomiting 05/24/2022   Hypothyroidism 05/24/2022   Pleural effusion on left 05/24/2022   Liver lesion 05/24/2022   Intellectual disability 02/21/2015   Seizure disorder, primary (HCC) 02/21/2015   Menopause 02/21/2015   Abnormal liver function tests 02/21/2015    Past Surgical History:  Procedure Laterality Date   PARTIAL HIP ARTHROPLASTY     REVISION  TOTAL KNEE ARTHROPLASTY      OB History     Gravida  0   Para  0   Term  0   Preterm  0   AB  0   Living  0      SAB  0   IAB  0   Ectopic  0   Multiple  0   Live Births               Home Medications    Prior to Admission medications   Medication Sig Start Date End Date Taking? Authorizing Provider  amoxicillin-clavulanate (AUGMENTIN) 875-125 MG tablet Take 1 tablet by mouth every 12 (twelve) hours for 7 days. 05/06/23 05/13/23 Yes Becky Augusta, NP  aspirin 325 MG EC tablet Take 325 mg by mouth daily.   Yes [provider]  azelastine (OPTIVAR) 0.05 % ophthalmic solution Place 1 drop into both eyes 2 (two) times daily.   Yes [provider]  azithromycin (ZITHROMAX Z-PAK) 250 MG tablet Take 1 tablet (250 mg total) by mouth daily. Take 2 tablets on the first day and then 1 tablet daily thereafter for a total of 5 days of treatment. 05/06/23  Yes Becky Augusta, NP  baclofen (LIORESAL) 20 MG tablet Take 20 mg by mouth 3 (three) times daily.   Yes [provider]  carbamazepine (TEGRETOL XR) 200 MG 12 hr tablet Take 200 mg by mouth 2 (two) times daily.   Yes [provider]  cetirizine (ZYRTEC) 10 MG tablet Take 10 mg by mouth daily as needed for allergies.   Yes [provider]  dicyclomine (BENTYL) 10 MG capsule Take 10 mg by mouth 4 (four) times daily -  before meals and at bedtime.   Yes [provider]  Docusate Sodium (DSS) 100 MG CAPS Take 100 mg by mouth 2 (two) times daily. 05/14/21  Yes [provider]  furosemide (LASIX) 20 MG tablet Take 20 mg by mouth as needed.    Yes [provider]  hydrOXYzine (ATARAX/VISTARIL) 25 MG tablet Take 25 mg by mouth 3 (three) times daily. 09/06/19  Yes [provider]  iron polysaccharides (NIFEREX) 150 MG capsule Take 1 capsule by mouth daily. 09/01/19  Yes [provider]  levothyroxine (SYNTHROID) 75 MCG tablet Take 75 mcg by mouth daily  before breakfast.   Yes [provider]  LORazepam (ATIVAN) 1 MG tablet Take 1 mg by mouth as needed.    Yes [provider]  meloxicam (MOBIC) 15 MG tablet Take 15 mg by mouth daily. 03/28/23  Yes [provider]  metoCLOPramide (REGLAN) 5 MG tablet Take 5 mg by mouth 4 (four) times daily as needed for nausea or vomiting.   Yes [provider]  mirtazapine (REMERON) 15 MG tablet Take 15 mg by mouth at bedtime.   Yes [provider]  Multiple Vitamin (MULTIVITAMIN) capsule Take 1 capsule by mouth daily.   Yes [provider]  OLANZapine (ZYPREXA) 5 MG tablet Take 5 mg by mouth in the morning, at noon, in the evening, and at bedtime.   Yes [provider]  omeprazole (PRILOSEC) 20 MG capsule Take 20 mg by mouth 2 (two) times daily.    Yes [provider]  Skin Protectants, Misc. (EUCERIN) cream Apply topically as needed for dry skin.   Yes [provider]  sucralfate (CARAFATE) 1 GM/10ML suspension Take 10 mLs (1 g total) by mouth 2 (two) times daily. 04/27/23  Yes Brenna Cam, MD  Sulfacetamide Sodium, Acne, 10 % LOTN Apply topically 2 (two) times daily.    Yes [provider]  tamsulosin (FLOMAX) 0.4 MG CAPS capsule Take 0.4 mg by mouth at bedtime.   Yes [provider]    Family History Family History  Problem Relation Age of Onset   Breast cancer Neg Hx    Ovarian cancer Neg Hx    Colon cancer Neg Hx     Social History Social History   Tobacco Use   Smoking status: Never   Smokeless tobacco: Never  Vaping Use   Vaping status: Never Used  Substance Use Topics   Alcohol use: No   Drug use: No     Allergies   Amantadines   Review of Systems Review of Systems  HENT:  Negative for congestion and rhinorrhea.   Respiratory:  Negative for cough.   Gastrointestinal:  Positive for nausea.       Dry heaving     Physical Exam Triage Vital Signs ED Triage Vitals  Encounter Vitals  Group     BP      Systolic BP Percentile      Diastolic BP Percentile      Pulse      Resp      Temp      Temp src      SpO2      Weight      Height      Head  Circumference      Peak Flow      Pain Score      Pain Loc      Pain Education      Exclude from Growth Chart    No data found.  Updated Vital Signs BP (!) 177/94 (BP Location: Left Arm)   Pulse 78   Temp (!) 97.1 F (36.2 C) (Temporal)   Resp 16   SpO2 94%   Visual Acuity Right Eye Distance:   Left Eye Distance:   Bilateral Distance:    Right Eye Near:   Left Eye Near:    Bilateral Near:     Physical Exam Vitals and nursing note reviewed.  Constitutional:      Appearance: Normal appearance. She is not ill-appearing.  HENT:     Head: Normocephalic and atraumatic.  Cardiovascular:     Rate and Rhythm: Normal rate and regular rhythm.     Pulses: Normal pulses.     Heart sounds: Normal heart sounds. No murmur heard.    No friction rub. No gallop.  Pulmonary:     Effort: Pulmonary effort is normal.     Breath sounds: Normal breath sounds. No wheezing, rhonchi or rales.  Skin:    General: Skin is warm and dry.     Capillary Refill: Capillary refill takes less than 2 seconds.  Neurological:     General: No focal deficit present.     Mental Status: She is alert and oriented to person, place, and time.      UC Treatments / Results  Labs (all labs ordered are listed, but only abnormal results are displayed) Labs Reviewed  RESP PANEL BY RT-PCR (FLU A&B, COVID) [id number]    EKG   Radiology DG Chest 2 View Result Date: 05/06/2023 CLINICAL DATA:  Recently hospitalized for aspiration pneumonia EXAM: CHEST - 2 VIEW COMPARISON:  Chest x-ray 04/25/2023.  CT of the chest 05/30/2022. FINDINGS: There is a small left pleural effusion. There is focal opacity in the left lower lobe with slightly nodular configuration measuring 3.7 cm. The right lung is clear. There is no pneumothorax. The cardiomediastinal  silhouette is within normal limits. No acute fractures are seen. IMPRESSION: 1. Small left pleural effusion. 2. Focal opacity in the left lower lobe with slightly nodular configuration measuring 3.7 cm. This may represent pneumonia. Follow-up chest x-ray in 4-6 weeks is recommended to ensure resolution. Electronically Signed   By: Darliss Cheney M.D.   On: 05/06/2023 17:21    Procedures Procedures (including critical care time)  Medications Ordered in UC Medications - No data to display  Initial Impression / Assessment and Plan / UC Course  I have reviewed the triage vital signs and the nursing notes.  Pertinent labs & imaging results that were available during my care of the patient were reviewed by me and considered in my medical decision making (see chart for details).   Patient is a nonverbal 58 year old female presenting for evaluation of elevated blood pressure and dry heaving as outlined HPI above.  The patient's blood pressure has improved and is currently 169/96.  She is continue to experience some intermittent dry heaving but she has not had any vomiting.  The attendant from the group home reports that she has not noticed any runny nose, nasal congestion, or cough.  It is unclear if the dry heaving caused the blood pressure to elevate or her elevated blood pressure was causing the dry heaving.  She has no documented history of  hypertension.  Given that she was hospitalized 11 days ago for aspiration pneumonia I will obtain a repeat chest x-ray to evaluate for any worsening pneumonia as well as obtain a respiratory panel to evaluate for COVID or influenza.  I will also have staff continue to monitor the patient's blood pressure and if it starts to increase I will refer her to the emergency department for evaluation.  Chest x-ray independently reviewed and evaluated by me.  Impression: There is a patchy infiltrate in the left lung base.  Apex of the right lung is obscured in the AP due to  patient's body habitus.  Radiology overread is pending. Radiology impression states there is a small left pleural effusion well is a 3.7 cm left lower lobe focal opacity thought to represent pneumonia.  Respiratory panel is negative for COVID or influenza.  I will discharge patient home with a diagnosis of left lower lobe pneumonia and start her on Augmentin 875 mg twice daily for 7 days along with azithromycin once daily for 5 days for treatment of her left lower lobe pneumonia.  I will have the group home continue to monitor her blood pressure and if she continues to be hypertensive I will have her follow-up with her primary care provider.  If her blood pressure returns to a diastolic over 100 I will have them take her to the ER for evaluation.   Final Clinical Impressions(s) / UC Diagnoses   Final diagnoses:  Aspiration of gastric contents, subsequent encounter  Hypertension, unspecified type  Pneumonia of left lower lobe due to infectious organism     Discharge Instructions      The chest x-ray shows a left lower lobe pneumonia.  This could be the reason why she has been experiencing the dry heaving and could be responsible for the elevated blood pressure.  Take the Augmentin 875 mg twice daily with food for 7 days for treatment of pneumonia.  Take the azithromycin once daily for 5 days for treatment of pneumonia.  Continue to monitor blood pressure at the group home.  If the readings stay consistently greater than 140/90 you need to make a follow-up appointment with your primary care provider to discuss possibly starting medication for hypertension.  If the diastolic (lower number) goes above 100 you need to go to the ER for evaluation.     ED Prescriptions     Medication Sig Dispense Auth. Provider   amoxicillin-clavulanate (AUGMENTIN) 875-125 MG tablet Take 1 tablet by mouth every 12 (twelve) hours for 7 days. 14 tablet Kent Pear, NP   azithromycin (ZITHROMAX Z-PAK) 250  MG tablet Take 1 tablet (250 mg total) by mouth daily. Take 2 tablets on the first day and then 1 tablet daily thereafter for a total of 5 days of treatment. 6 tablet Kent Pear, NP      PDMP not reviewed this encounter.   Kent Pear, NP 05/06/23 1737

## 2023-05-06 NOTE — ED Notes (Signed)
Patient unable to answer

## 2023-05-06 NOTE — Discharge Instructions (Addendum)
 The chest x-ray shows a left lower lobe pneumonia.  This could be the reason why she has been experiencing the dry heaving and could be responsible for the elevated blood pressure.  Take the Augmentin 875 mg twice daily with food for 7 days for treatment of pneumonia.  Take the azithromycin once daily for 5 days for treatment of pneumonia.  Continue to monitor blood pressure at the group home.  If the readings stay consistently greater than 140/90 you need to make a follow-up appointment with your primary care provider to discuss possibly starting medication for hypertension.  If the diastolic (lower number) goes above 100 you need to go to the ER for evaluation.

## 2023-05-06 NOTE — ED Triage Notes (Signed)
 Patient is here with group home. BP was 164 150 and dry heaving. Patient is no longer dry heaving.

## 2023-07-09 ENCOUNTER — Ambulatory Visit (INDEPENDENT_AMBULATORY_CARE_PROVIDER_SITE_OTHER)

## 2023-07-09 ENCOUNTER — Ambulatory Visit
Admission: EM | Admit: 2023-07-09 | Discharge: 2023-07-09 | Disposition: A | Discharge status: Home / Self Care | Attending: Family Medicine | Admitting: Family Medicine

## 2023-07-09 ENCOUNTER — Ambulatory Visit: Payer: Self-pay | Admitting: Family Medicine

## 2023-07-09 DIAGNOSIS — R051 Acute cough: Secondary | ICD-10-CM | POA: Diagnosis present

## 2023-07-09 DIAGNOSIS — G809 Cerebral palsy, unspecified: Secondary | ICD-10-CM

## 2023-07-09 DIAGNOSIS — Z8701 Personal history of pneumonia (recurrent): Secondary | ICD-10-CM | POA: Diagnosis not present

## 2023-07-09 LAB — RESP PANEL BY RT-PCR (RSV, FLU A&B, COVID)  RVPGX2
Influenza A by PCR: NEGATIVE
Influenza B by PCR: NEGATIVE
Resp Syncytial Virus by PCR: NEGATIVE
SARS Coronavirus 2 by RT PCR: NEGATIVE

## 2023-07-09 MED ORDER — AZITHROMYCIN 250 MG PO TABS
250.0000 mg | ORAL_TABLET | Freq: Every day | ORAL | 0 refills | Status: AC
Start: 2023-07-09 — End: ?

## 2023-07-09 MED ORDER — CEFDINIR 300 MG PO CAPS
300.0000 mg | ORAL_CAPSULE | Freq: Two times a day (BID) | ORAL | 0 refills | Status: AC
Start: 2023-07-09 — End: ?

## 2023-07-09 NOTE — ED Provider Notes (Signed)
 [facility]    CSN: [id number] Arrival date & time: 07/09/23  1229      History   Chief Complaint Chief Complaint  Patient presents with   Cough    HPI [name] is a 58 y.o. female.   HPI  History obtained from caregiver as pt is non-verbal. [name] presents for deep cough that started yesterday. Caregiver reports it appears that [name] is having a hard time breathing.  No fever or sputum production.  There has been no vomiting or diarrhea. No medication for cough given. No one else at the home has a cough.     Past Medical History:  Diagnosis Date   Acne    Cerebral palsy (HCC)    Kidney stones    Mental retardation    Myopia    Scoliosis    Seizure disorder [facility])     Patient Active Problem List   Diagnosis Date Noted   Aspiration pneumonia (HCC) 04/26/2023   Anxiety and depression 04/26/2023   GERD without esophagitis 04/26/2023   Aspiration of gastric contents 04/26/2023   Hypoxia 04/26/2023   Sepsis due to gram-negative UTI (HCC) 08/18/2022   Acute respiratory failure with hypoxia (HCC) 08/18/2022   COVID-19 virus infection 05/24/2022   Cerebral palsy (HCC) 05/24/2022   Seizure disorder (HCC) 05/24/2022   UTI (urinary tract infection) 05/24/2022   Thrombocytopenia (HCC) 05/24/2022   Nausea & vomiting 05/24/2022   Hypothyroidism 05/24/2022   Pleural effusion on left 05/24/2022   Liver lesion 05/24/2022   Intellectual disability 02/21/2015   Seizure disorder, primary (HCC) 02/21/2015   Menopause 02/21/2015   Abnormal liver function tests 02/21/2015    Past Surgical History:  Procedure Laterality Date   PARTIAL HIP ARTHROPLASTY     REVISION TOTAL KNEE ARTHROPLASTY      OB History     Gravida  0   Para  0   Term  0   Preterm  0   AB  0   Living  0      SAB  0   IAB  0   Ectopic  0   Multiple  0   Live Births               Home Medications    Prior to Admission medications    Medication Sig Start Date End Date Taking? Authorizing Provider  aspirin  325 MG EC tablet Take 325 mg by mouth daily.   Yes [provider]  azelastine (OPTIVAR) 0.05 % ophthalmic solution Place 1 drop into both eyes 2 (two) times daily.   Yes [provider]  baclofen  (LIORESAL ) 20 MG tablet Take 20 mg by mouth 3 (three) times daily.   Yes [provider]  carbamazepine  (TEGRETOL  XR) 200 MG 12 hr tablet Take 200 mg by mouth 2 (two) times daily.   Yes [provider]  cefdinir  (OMNICEF ) 300 MG capsule Take 1 capsule (300 mg total) by mouth 2 (two) times daily. 07/09/23  Yes Ran Tullis, DO  cetirizine (ZYRTEC) 10 MG tablet Take 10 mg by mouth daily as needed for allergies.   Yes [provider]  dicyclomine  (BENTYL ) 10 MG capsule Take 10 mg by mouth 4 (four) times daily -  before meals and at bedtime.   Yes [provider]  Docusate Sodium  (DSS) 100 MG CAPS Take 100 mg by mouth 2 (two) times daily. 05/14/21  Yes [provider]  furosemide  (LASIX ) 20 MG tablet Take 20  mg by mouth as needed.    Yes [provider]  hydrOXYzine  (ATARAX /VISTARIL ) 25 MG tablet Take 25 mg by mouth 3 (three) times daily. 09/06/19  Yes [provider]  iron  polysaccharides (NIFEREX) 150 MG capsule Take 1 capsule by mouth daily. 09/01/19  Yes [provider]  levothyroxine  (SYNTHROID ) 75 MCG tablet Take 75 mcg by mouth daily before breakfast.   Yes [provider]  LORazepam  (ATIVAN ) 1 MG tablet Take 1 mg by mouth as needed.    Yes [provider]  meloxicam (MOBIC) 15 MG tablet Take 15 mg by mouth daily. 03/28/23  Yes [provider]  metoCLOPramide  (REGLAN ) 5 MG tablet Take 5 mg by mouth 4 (four) times daily as needed for nausea or vomiting.   Yes [provider]  mirtazapine  (REMERON ) 15 MG tablet Take 15 mg by mouth at bedtime.   Yes [provider]  Multiple Vitamin (MULTIVITAMIN)  capsule Take 1 capsule by mouth daily.   Yes [provider]  OLANZapine  (ZYPREXA ) 5 MG tablet Take 5 mg by mouth in the morning, at noon, in the evening, and at bedtime.   Yes [provider]  omeprazole (PRILOSEC) 20 MG capsule Take 20 mg by mouth 2 (two) times daily.    Yes [provider]  Skin Protectants, Misc. (EUCERIN) cream Apply topically as needed for dry skin.   Yes [provider]  sucralfate  (CARAFATE ) 1 GM/10ML suspension Take 10 mLs (1 g total) by mouth 2 (two) times daily. 04/27/23  Yes Brenna Cam, MD  Sulfacetamide  Sodium, Acne, 10 % LOTN Apply topically 2 (two) times daily.    Yes [provider]  tamsulosin  (FLOMAX ) 0.4 MG CAPS capsule Take 0.4 mg by mouth at bedtime.   Yes [provider]  azithromycin  (ZITHROMAX  Z-PAK) 250 MG tablet Take 1 tablet (250 mg total) by mouth daily. Take 2 tablets on the first day and then 1 tablet daily thereafter for a total of 5 days of treatment. 07/09/23   Fidel Huddle, DO    Family History Family History  Problem Relation Age of Onset   Breast cancer Neg Hx    Ovarian cancer Neg Hx    Colon cancer Neg Hx     Social History Social History   Tobacco Use   Smoking status: Never   Smokeless tobacco: Never  Vaping Use   Vaping status: Never Used  Substance Use Topics   Alcohol use: No   Drug use: No     Allergies   Amantadines   Review of Systems Review of Systems: negative unless otherwise stated in HPI.      Physical Exam Triage Vital Signs ED Triage Vitals [07/09/23 1251]  Encounter Vitals Group     BP (!) 163/95     Girls Systolic BP Percentile      Girls Diastolic BP Percentile      Boys Systolic BP Percentile      Boys Diastolic BP Percentile      Pulse Rate 76     Resp (!) 22     Temp 98.7 F (37.1 C)     Temp Source Temporal     SpO2 99 %     Weight      Height      Head Circumference      Peak Flow      Pain Score      Pain Loc      Pain  Education  Exclude from Growth Chart    No data found.  Updated Vital Signs BP (!) 163/95 (BP Location: Left Arm)   Pulse 76   Temp 98.7 F (37.1 C) (Temporal)   Resp (!) 22   SpO2 99%   Visual Acuity Right Eye Distance:   Left Eye Distance:   Bilateral Distance:    Right Eye Near:   Left Eye Near:    Bilateral Near:     Physical Exam GEN:     alert, chronically ill appearing female in no distress seated in her wheelchair   HENT:  mucus membranes moist, oropharyngeal without lesions or erythema, no tonsillar hypertrophy or exudates, no nasal discharge EYES:   no scleral injection or discharge RESP:  Tachypnea, clear to auscultation bilaterally CVS:   regular rate and rhythm Skin:   warm and dry, no rash on visible skin    UC Treatments / Results  Labs (all labs ordered are listed, but only abnormal results are displayed) Labs Reviewed  RESP PANEL BY RT-PCR (RSV, FLU A&B, COVID)  [id number]    EKG   Radiology DG Chest 2 View Result Date: 07/09/2023 CLINICAL DATA:  Cough for 2 days EXAM: CHEST - 2 VIEW COMPARISON:  05/06/2023, 04/01/2018 FINDINGS: Large hiatal hernia. Small left-sided pleural effusion. No focal airspace disease. Stable cardiomediastinal silhouette. No pneumothorax. Fixating rods in the spine with chronic fracture of the right rod at the lower thoracic region. IMPRESSION: 1. Small left pleural effusion. 2. Large hiatal hernia. Electronically Signed   By: Esmeralda Hedge M.D.   On: 07/09/2023 15:18    Procedures Procedures (including critical care time)  Medications Ordered in UC Medications - No data to display  Initial Impression / Assessment and Plan / UC Course  I have reviewed the triage vital signs and the nursing notes.  Pertinent labs & imaging results that were available during my care of the patient were reviewed by me and considered in my medical decision making (see chart for details).       Pt is a 58 y.o. female who has cerbral  palsy and history of aspiration pneumonia presents for 2 days of cough. [name] is afebrile here without recent antipyretics. Satting well on room air. Overall pt is non-toxic appearing, well hydrated, without respiratory distress though she has some mild tachypnea. Pulmonary exam is unremarkable.  COVID, RSV and influenza panel obtained and was negative.   Chest xray personally reviewed by me concerning for pneumonia but no pleural effusion, significant cardiomegaly or pneumothorax. Patient's caregiver aware the radiologist has not read her xray and is comfortable with the preliminary read by me. Will review radiologist read when available and call, if a change in plan is warranted.  Caregiver agreeable to this plan prior to discharge.   Treat with azithromycin  and cefdinir  for CAP coverage. Discussed symptomatic treatment. Typical duration of symptoms discussed.   Return and ED precautions given and voiced understanding. Discussed MDM, treatment plan and plan for follow-up with patient's caregiver who agrees with plan.   Radiologist reports small left pleural effusion which is not new.  Seen on  05/06/23 CXR.  Continue plan as discussed.    Final Clinical Impressions(s) / UC Diagnoses   Final diagnoses:  Acute cough  Cerebral palsy, unspecified type (HCC)  History of aspiration pneumonia     Discharge Instructions      COVID, influenza and RSV tests are negative.   Kathy's chest xray is concerning for pneumonia.  I sent antibiotics to  your pharmacy. Stop by the pharmacy to pick up your prescriptions.  Follow up with your primary care provider or return to the urgent care, if not improving.       ED Prescriptions     Medication Sig Dispense Auth. Provider   azithromycin  (ZITHROMAX  Z-PAK) 250 MG tablet Take 1 tablet (250 mg total) by mouth daily. Take 2 tablets on the first day and then 1 tablet daily thereafter for a total of 5 days of treatment. 6 tablet Jeremaih Klima, DO    cefdinir  (OMNICEF ) 300 MG capsule Take 1 capsule (300 mg total) by mouth 2 (two) times daily. 10 capsule Trenyce Loera, DO      PDMP not reviewed this encounter.   Collyn Selk, DO 07/09/23 1540

## 2023-07-09 NOTE — ED Triage Notes (Signed)
Cough x 2 days

## 2023-07-09 NOTE — Discharge Instructions (Addendum)
 COVID, influenza and RSV tests are negative.   Kathy's chest xray is concerning for pneumonia.  I sent antibiotics to your pharmacy. Stop by the pharmacy to pick up your prescriptions.  Follow up with your primary care provider or return to the urgent care, if not improving.

## 2023-08-14 NOTE — Progress Notes (Signed)
 MEDICARE WELLNESS VISIT   PROVIDERS RENDERING CARE Dr. Lenon, Gyn at [facility], Dr. Bernardino at [facility]    FUNCTIONAL ASSESSMENT  (1) Hearing: Demonstrates normal hearing in conversation. Hearing was checked via the whisper test and the patient did hear it at 7-10 feet.  (2) Risk of Falls: Watched closely by staff  (3) Home Safety; Home is safe and secure (4) Activities of Daily Living; Staff accomplishes    DEPRESSION SCREENING Difficult to judge   COGNITIVE SCREENING Severe CP is noted     PREVENTION PLAN Cardiovascular; Doing well on yearly cholesterol treatment  Diabetes; Yearly glucose Mammogram; Gets yearly when she will cooperate Bone Density; Vitamin d daily Colon Cancer; Would not tolerate colonoscopy prep regimen  Glaucoma; Difficult to accomplish so not really feasible  Pneumonia; Not indicated   Covid; Has had multiple vaccines Shingles: Shingrix via [facility] 7-20 Influenza; Yearly flu vaccine Smoking Cessation; NA     OTHER PERSONALIZED HEALTH ADVISE Eating can be difficult for her with dysphagia with the CP   END OF LIFE CARE WANTS Cannot really give us  her wants but should clearly be tempered by her illnesses  Layman Lenon MD    [name] is a 58 y.o. female here for follow up of their medical problems  CHIEF COMPLAINT:  Follow up medical problems in the problem list and as discussed in the history and assessment areas as well as new complaints as listed.   Patient Active Problem List  Diagnosis  . Seizure disorder (CMS/HHS-HCC)  . CP (cerebral palsy) (CMS/HHS-HCC)  . Hypothyroid  . Dysphagia, idiopathic  . Healthcare maintenance  . Wheelchair dependence  . Lives in group home  . Excessive salivation  . GERD without esophagitis  . Intellectual disability     HISTORY OF PRESENT ILLNESS:  Seizure disorder (CMS/HHS-HCC) Unclear if has had vs meds for psych issues on tegretol   Lives in group home [facility] has  given her excellent care for many years.   CP (cerebral palsy) With severe disability   Past Medical History:  Diagnosis Date  . C. difficile diarrhea 02/2019  . Cerebral palsy (CMS/HHS-HCC)   . Hypothyroid   . Mental retardation   . Quadriplegia (CMS/HHS-HCC)   . Seizures (CMS/HHS-HCC)   . Sleep apnea     History reviewed. No pertinent surgical history.   No fever chills or sweats   No nausea, vomiting or diarrhea  No chest pain, shortness of breath   Social History   Socioeconomic History  . Marital status: Single  Tobacco Use  . Smoking status: Never  . Smokeless tobacco: Never  Vaping Use  . Vaping status: Never Used  Substance and Sexual Activity  . Alcohol use: No  . Drug use: No  . Sexual activity: Defer   Social Drivers of Health   Financial Resource Strain: Patient Unable To Answer (08/14/2023)   Overall Financial Resource Strain (CARDIA)   . Difficulty of Paying Living Expenses: Patient unable to answer  Food Insecurity: Patient Unable To Answer (08/14/2023)   Hunger Vital Sign   . Worried About Programme researcher, broadcasting/film/video in the Last Year: Patient unable to answer   . Ran Out of Food in the Last Year: Patient unable to answer  Transportation Needs: Patient Unable To Answer (08/14/2023)   PRAPARE - Transportation   . Lack of Transportation (Medical): Patient unable to answer   . Lack of Transportation (Non-Medical): Patient unable to answer  Social Connections: Unknown (04/26/2023)  Received from [facility]   Social Connection and Isolation Panel   . Are you married, widowed, divorced, separated, never married, or living with a partner?: Never married  Housing Stability: Patient Unable To Answer (08/14/2023)   Housing Stability Vital Sign   . Unable to Pay for Housing in the Last Year: Patient unable to answer   . Homeless in the Last Year: Patient unable to answer      Current Outpatient Medications:  .  AQUAPHOR HEALING 41 % ointment, APPLY TO RED,  IRRITATED AREA(S) AFTER DIAPER CHANGES, Disp: 396 g, Rfl: 1 .  aspirin  325 MG EC tablet, TAKE 1 TABLET BY MOUTH ONCE DAILY FOR CIRCULATION, Disp: 30 tablet, Rfl: 5 .  azelastine (OPTIVAR) 0.05 % ophthalmic solution, INSTILL 1 DROP INTO BOTH EYES TWICE DAILY AS NEEDED FOR REDNESS, Disp: 6 mL, Rfl: 1 .  baclofen  (LIORESAL ) 20 MG tablet, TAKE 1 TABLET BY MOUTH 3 TIMES DAILY, Disp: 90 tablet, Rfl: 5 .  carBAMazepine  (TEGRETOL  XR) 200 MG XR tablet, Take 1 tablet (200 mg total) by mouth 2 (two) times daily, Disp: 60 tablet, Rfl: 5 .  cetirizine (ZYRTEC) 10 MG tablet, TAKE 1 TABLET BY MOUTH ONCE DAILY AS NEEDED FOR ALLERGY SYMPTOMS, Disp: 30 tablet, Rfl: 11 .  dicyclomine  (BENTYL ) 10 mg capsule, TAKE 1 CAPSULE BY MOUTH 4 TIMES DAILY, Disp: 120 capsule, Rfl: 5 .  DICYCLOMINE  HCL (DICYCLOMINE  ORAL), Take 10 mg by mouth 4 (four) times daily   , Disp: , Rfl:  .  docusate (COLACE) 100 MG capsule, take 1 capsule by mouth twice a day, Disp: 60 capsule, Rfl: 5 .  docusate (COLACE) 50 mg/5 mL liquid, TAKE 10 ML BY MOUTH TWICE A DAY, Disp: 600 mL, Rfl: 1 .  ergocalciferol, vitamin D2, 1,250 mcg (50,000 unit) capsule, TAKE 1 CAPSULE BY MOUTH ONCE A WEEK, Disp: 4 capsule, Rfl: 5 .  ferrous sulfate 220 mg (44 mg iron )/5 mL oral solution, TAKE 5 ML BY MOUTH ONCE DAILY. MIX WITH WATER OR JUICE., Disp: 150 mL, Rfl: 1 .  food supplemt, lactose-reduced (ENSURE) Liqd, DRINK 1 CAN (237 ML) BY MOUTH DAILY AT 2:00 PM, Disp: 5688 mL, Rfl: 3 .  hydrocortisone (ANUSOL-HC) 2.5 % rectal cream, Apply topically 3 (three) times daily, Disp: 28 g, Rfl: 3 .  hydrOXYzine  (ATARAX ) 25 MG tablet, Take 1 tablet by mouth 3 (three) times daily as needed, Disp: , Rfl:  .  IFEREX 150 150 mg iron  capsule, take 1 capsule by mouth once daily, Disp: 30 capsule, Rfl: 5 .  lanolin-mineral oil (EUCERIN ORIGINAL) lotion, APPLY TO AFFECTED AREA(S) TWICE A DAY, Disp: 250 mL, Rfl: 3 .  levothyroxine  (SYNTHROID ) 75 MCG tablet, Take 1 tablet (75 mcg total) by  mouth once daily Take on an empty stomach with a glass of water at least 30-60 minutes before breakfast., Disp: 90 tablet, Rfl: 3 .  LORazepam  (ATIVAN ) 1 MG tablet, TAKE ONE TABLET BY MOUTH 90 MINUTES BEFORE APPOINTMENT ON EMPTY STOMACH. MAY REPEAT AS NECESSARY., Disp: 5 tablet, Rfl: 0 .  meloxicam (MOBIC) 15 MG tablet, Take 1 tablet (15 mg total) by mouth once daily, Disp: 30 tablet, Rfl: 0 .  metoclopramide  (REGLAN ) 5 MG tablet, TAKE 1 TABLET BY MOUTH 4 TIMES DAILY, 1 HOUR PRIOR TO MEALS AND AT BEDTIME AS NEEDED FOR N/V, Disp: 120 tablet, Rfl: 0 .  mirtazapine  (REMERON ) 15 MG tablet, take 1 tablet by mouth at bedtime, Disp: 30 tablet, Rfl: 11 .  multivitamin tablet, TAKE  1 TABLET BY MOUTH EVERY MORNING, Disp: 90 tablet, Rfl: 0 .  OLANZapine  (ZYPREXA ) 5 MG tablet, Take 1 tablet (5 mg total) by mouth as directed 1 am 2 pm and 2 hs, Disp: 150 tablet, Rfl: 11 .  omeprazole (PRILOSEC) 20 MG DR capsule, TAKE 1 CAPSULE BY MOUTH 2 TIMES PER DAY FOR REFLUX, Disp: 180 capsule, Rfl: 1 .  sucralfate  (CARAFATE ) 100 mg/mL suspension, TAKE 2 TEASPOONSFUL (10 ML) BY MOUTH 4 TIMES A DAY BEFORE MEALS AND AT BEDTIME FOR STOMACH ULCER, Disp: 473 mL, Rfl: 1 .  tamsulosin  (FLOMAX ) 0.4 mg capsule, Take 1 capsule (0.4 mg total) by mouth once daily Take 30 minutes after same meal each day., Disp: 30 capsule, Rfl: 11 .  vitamin E external oil, WASH HANDS TWICE DAILY GENTLY WITH DOVE SOAP, THEN APPLY VITAMIN E, TWICE DAILY., Disp: 29.5 mL, Rfl: 2 .  white petrolatum 61 % Crea, Apply topically, Disp: , Rfl:   Vitals:   08/14/23 1127  BP: 132/78  Pulse: 78   Body mass index is 22.72 kg/m. No acute distress Lungs; clear to ascultation Heart; Regular rate and rhythm  Abdomen; Soft and flat, normal bowel sounds Extremities; No clubbing, cyanosis or edema  Appointment on 08/04/2023  Component Date Value Ref Range Status  . WBC (White Blood Cell Count) 08/04/2023 3.7 (L)  4.1 - 10.2 10^3/uL Final  . RBC (Red Blood Cell  Count) 08/04/2023 4.65  4.04 - 5.48 10^6/uL Final  . Hemoglobin 08/04/2023 13.6  12.0 - 15.0 gm/dL Final  . Hematocrit 92/85/7974 41.5  35.0 - 47.0 % Final  . MCV (Mean Corpuscular Volume) 08/04/2023 89.2  80.0 - 100.0 fl Final  . MCH (Mean Corpuscular Hemoglobin) 08/04/2023 29.2  27.0 - 31.2 pg Final  . MCHC (Mean Corpuscular Hemoglobin * 08/04/2023 32.8  32.0 - 36.0 gm/dL Final  . Platelet Count 08/04/2023 105 (L)  150 - 450 10^3/uL Final  . RDW-CV (Red Cell Distribution Widt* 08/04/2023 14.0  11.6 - 14.8 % Final  . MPV (Mean Platelet Volume) 08/04/2023 10.2  9.4 - 12.4 fl Final  . Neutrophils 08/04/2023 2.32  1.50 - 7.80 10^3/uL Final  . Lymphocytes 08/04/2023 0.81 (L)  1.00 - 3.60 10^3/uL Final  . Monocytes 08/04/2023 0.33  0.00 - 1.50 10^3/uL Final  . Eosinophils 08/04/2023 0.16  0.00 - 0.55 10^3/uL Final  . Basophils 08/04/2023 0.04  0.00 - 0.09 10^3/uL Final  . Neutrophil % 08/04/2023 63.1  32.0 - 70.0 % Final  . Lymphocyte % 08/04/2023 22.1  10.0 - 50.0 % Final  . Monocyte % 08/04/2023 9.0  4.0 - 13.0 % Final  . Eosinophil % 08/04/2023 4.4  1.0 - 5.0 % Final  . Basophil% 08/04/2023 1.1  0.0 - 2.0 % Final  . Immature Granulocyte % 08/04/2023 0.3  <=0.7 % Final  . Immature Granulocyte Count 08/04/2023 0.01  <=0.06 10^3/L Final  . Glucose 08/04/2023 87  70 - 110 mg/dL Final  . Sodium 92/85/7974 145  136 - 145 mmol/L Final  . Potassium 08/04/2023 4.0  3.6 - 5.1 mmol/L Final  . Chloride 08/04/2023 106  97 - 109 mmol/L Final  . Carbon Dioxide (CO2) 08/04/2023 28.5  22.0 - 32.0 mmol/L Final  . Urea Nitrogen (BUN) 08/04/2023 24  7 - 25 mg/dL Final  . Creatinine 92/85/7974 0.6  0.6 - 1.1 mg/dL Final  . Glomerular Filtration Rate (eGFR) 08/04/2023 103  >60 mL/min/1.73sq m Final  . Calcium 08/04/2023 9.5  8.7 - 10.3 mg/dL  Final  . AST  08/04/2023 19  8 - 39 U/L Final  . ALT  08/04/2023 21  5 - 38 U/L Final  . Alk Phos (alkaline Phosphatase) 08/04/2023 241 (H)  34 - 104 U/L Final  .  Albumin 08/04/2023 4.1  3.5 - 4.8 g/dL Final  . Bilirubin, Total 08/04/2023 0.3  0.3 - 1.2 mg/dL Final  . Protein, Total 08/04/2023 6.9  6.1 - 7.9 g/dL Final  . A/G Ratio 92/85/7974 1.5  1.0 - 5.0 gm/dL Final  . Thyroid Stimulating Hormone (TSH) 08/04/2023 2.286  0.450-5.330 uIU/ml uIU/mL Final  . Cholesterol, Total 08/04/2023 289 (H)  100 - 200 mg/dL Final  . Triglyceride 92/85/7974 251 (H)  35 - 199 mg/dL Final  . HDL (High Density Lipoprotein) [facility]* 08/04/2023 50.6  35.0 - 85.0 mg/dL Final  . LDL Calculated 08/04/2023 811 (H)  0 - 130 mg/dL Final  . VLDL Cholesterol 08/04/2023 50  mg/dL Final  . Cholesterol/HDL Ratio 08/04/2023 5.7   Final  . Carbamazepine (Tegretol ), S - [facility]* 08/04/2023 12.5 (>)  4.0 - 12.0 ug/mL Final    ASSESSMENT  AND PLAN:  Diagnoses and all orders for this visit:  Cerebral palsy, unspecified type (CMS/HHS-HCC) Assessment & Plan: With severe disability    Routine general medical examination at a health care facility -     Comprehensive Metabolic Panel (CMP); Future -     Lipid Panel w/calc LDL; Future -     Thyroid Stimulating-Hormone (TSH); Future -     Hemoglobin A1C; Future -     Carbamazepine (Tegretol ), S - [facility]; Future  Hypothyroidism due to Hashimoto thyroiditis -     Comprehensive Metabolic Panel (CMP); Future -     Lipid Panel w/calc LDL; Future -     Thyroid Stimulating-Hormone (TSH); Future -     Hemoglobin A1C; Future -     Carbamazepine (Tegretol ), S - [facility]; Future  Seizure disorder (CMS/HHS-HCC) Assessment & Plan: Unclear if has had vs meds for psych issues on tegretol   Orders: -     Comprehensive Metabolic Panel (CMP); Future -     Lipid Panel w/calc LDL; Future -     Thyroid Stimulating-Hormone (TSH); Future -     Hemoglobin A1C; Future -     Carbamazepine (Tegretol ), S - [facility]; Future  Lives in group home Assessment & Plan: [facility] has given her excellent care for many years.    Intellectual  disability  Abnormal finding of blood chemistry, unspecified -     Hemoglobin A1C; Future

## 2023-09-04 ENCOUNTER — Emergency Department
Admission: EM | Admit: 2023-09-04 | Discharge: 2023-09-04 | Disposition: A | Discharge status: Home / Self Care | Source: Other Acute Inpatient Hospital | Attending: Emergency Medicine | Admitting: Emergency Medicine

## 2023-09-04 ENCOUNTER — Other Ambulatory Visit: Payer: Self-pay

## 2023-09-04 ENCOUNTER — Emergency Department

## 2023-09-04 DIAGNOSIS — T23072A Burn of unspecified degree of left wrist, initial encounter: Secondary | ICD-10-CM

## 2023-09-04 DIAGNOSIS — X19XXXA Contact with other heat and hot substances, initial encounter: Secondary | ICD-10-CM | POA: Insufficient documentation

## 2023-09-04 DIAGNOSIS — T310 Burns involving less than 10% of body surface: Secondary | ICD-10-CM | POA: Diagnosis not present

## 2023-09-04 DIAGNOSIS — T23172A Burn of first degree of left wrist, initial encounter: Secondary | ICD-10-CM | POA: Insufficient documentation

## 2023-09-04 DIAGNOSIS — R051 Acute cough: Secondary | ICD-10-CM | POA: Insufficient documentation

## 2023-09-04 LAB — BASIC METABOLIC PANEL WITH GFR
Anion gap: 9 (ref 5–15)
BUN: 20 mg/dL (ref 6–20)
CO2: 29 mmol/L (ref 22–32)
Calcium: 9 mg/dL (ref 8.9–10.3)
Chloride: 105 mmol/L (ref 98–111)
Creatinine, Ser: 0.67 mg/dL (ref 0.44–1.00)
GFR, Estimated: >60 mL/min (ref >60)
Glucose, Bld: 107 mg/dL — ABNORMAL HIGH (ref 70–99)
Potassium: 4.2 mmol/L (ref 3.5–5.1)
Sodium: 143 mmol/L (ref 135–145)

## 2023-09-04 LAB — CBC WITH DIFFERENTIAL/PLATELET
Abs Immature Granulocytes: 0.01 K/uL (ref 0.00–0.07)
Basophils Absolute: 0.1 K/uL (ref 0.0–0.1)
Basophils Relative: 1 %
Eosinophils Absolute: 0.3 K/uL (ref 0.0–0.5)
Eosinophils Relative: 6 %
HCT: 39.9 % (ref 36.0–46.0)
Hemoglobin: 12.7 g/dL (ref 12.0–15.0)
Immature Granulocytes: 0 %
Lymphocytes Relative: 22 %
Lymphs Abs: 0.9 K/uL (ref 0.7–4.0)
MCH: 29.1 pg (ref 26.0–34.0)
MCHC: 31.8 g/dL (ref 30.0–36.0)
MCV: 91.3 fL (ref 80.0–100.0)
Monocytes Absolute: 0.5 K/uL (ref 0.1–1.0)
Monocytes Relative: 12 %
Neutro Abs: 2.3 K/uL (ref 1.7–7.7)
Neutrophils Relative %: 59 %
Platelets: 102 K/uL — ABNORMAL LOW (ref 150–400)
RBC: 4.37 MIL/uL (ref 3.87–5.11)
RDW: 14.6 % (ref 11.5–15.5)
WBC: 4 K/uL (ref 4.0–10.5)
nRBC: 0 % (ref 0.0–0.2)
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
## 2023-09-04 NOTE — Discharge Instructions (Signed)
 For facility staff/caregivers, responding to the specific concerns in the letter provided:  Wrist burn: I agree, this looks like a friction burn.  The edges are well defined, there is no drainage, and no signs of an infection.  I recommend applying bacitracin or neosporin ointment twice a day and covering it in a gauze dressing as much as Nathanel is able to tolerate.  It should be washed gently with soap and water when she is bathed.  Return if it increases in size, starts draining fluid, if redness or a rash develops around it, or any streaking up the arm.  Cough: On exam she sounds a little congested but her oxygen level is normal, we did a chest X-ray which is clear, and her blood work shows a normal white blood cell count.  So at this time, there are no signs of pneumonia.  The congestion should get better, but she should return to the ER for new or worsening cough, difficulty breathing, fever, low oxygen level, or any other symptoms that are concerning.    Her blood counts and chemistry are normal.  Her vital signs have been normal here.  She is stable to go back to her facility at this time.

## 2023-09-04 NOTE — ED Provider Notes (Signed)
 [facility] Provider Note    Event Date/Time   First MD Initiated Contact with Patient 09/04/23 1638     (approximate)   History   Burn and Cough    HPI  [name] is a 58 y.o. female with a history of cerebral palsy, intellectual disability, seizure disorder, aspiration pneumonia who presents with a skin lesion to her left wrist as well as a cough.  The history is provided by the caregiver as well as a note from the RN at the patient's facility.  The note indicates concern for a possible friction burn to her left wrist caused by mittens which has been observed for the last 2 days.  She has also had a cough but no shortness of breath over the last several days.  There was some concern for possible aspiration pneumonia.  The patient herself is unable to provide any history directly.  I reviewed the past medical records.  The patient's most recent outpatient encounter was on 7/24 with internal medicine for a Medicare wellness visit.   Physical Exam   Triage Vital Signs: ED Triage Vitals  Encounter Vitals Group     BP 09/04/23 1412 122/88     Girls Systolic BP Percentile --      Girls Diastolic BP Percentile --      Boys Systolic BP Percentile --      Boys Diastolic BP Percentile --      Pulse Rate 09/04/23 1411 86     Resp 09/04/23 1411 18     Temp 09/04/23 1413 (!) 97.5 F (36.4 C)     Temp Source 09/04/23 1413 Axillary     SpO2 09/04/23 1411 94 %     Weight 09/04/23 1412 122 lb 12.7 oz (55.7 kg)     Height --      Head Circumference --      Peak Flow --      Pain Score --      Pain Loc --      Pain Education --      Exclude from Growth Chart --     Most recent vital signs: Vitals:   09/04/23 1412 09/04/23 1413  BP: 122/88   Pulse:    Resp:    Temp:  (!) 97.5 F (36.4 C)  SpO2:       General: Awake, no distress.  CV:  Good peripheral perfusion.  Resp:  Normal effort.  Lungs CTAB. Abd:  No distention.  Other:  Approximately  4 cm x 2 cm superficial burn like wound to the left medial wrist with no drainage and no surrounding erythema, induration, or abnormal warmth.   ED Results / Procedures / Treatments   Labs (all labs ordered are listed, but only abnormal results are displayed) Labs Reviewed  BASIC METABOLIC PANEL WITH GFR - Abnormal; Notable for the following components:      Result Value   Glucose, Bld 107 (*)    All other components within normal limits  CBC WITH DIFFERENTIAL/PLATELET - Abnormal; Notable for the following components:   Platelets 102 (*)    All other components within normal limits     EKG     RADIOLOGY  Chest x-ray: I independently viewed and interpreted the images; there is no focal consolidation or edema  PROCEDURES:  Critical Care performed: No  Procedures   MEDICATIONS ORDERED IN ED: Medications - No data to display   IMPRESSION / MDM / ASSESSMENT AND PLAN /  ED COURSE  I reviewed the triage vital signs and the nursing notes.  Differential diagnosis includes, but is not limited to:  Left wrist: This is consistent with a friction or other superficial second-degree burn.  There is no evidence of infection or other acute complication.  I recommended wound care with bacitracin and dressing changes.  Cough: BMP and CBC show no acute findings.  There is no leukocytosis.  Chest x-ray is clear.  The patient's O2 saturation is in the mid 90s and she does not demonstrate any shortness of breath, wheezing, or respiratory distress.  There is no evidence for aspiration, other pneumonia, or indication for acute treatment.  Patient's presentation is most consistent with acute complicated illness / injury requiring diagnostic workup.  At this time the patient is stable for discharge home.  I counseled the caregiver who is here with her on the results of my evaluation and recommendations.  I have also provided these instructions and return precautions in written form.   FINAL  CLINICAL IMPRESSION(S) / ED DIAGNOSES   Final diagnoses:  Burn of left wrist, unspecified burn degree, initial encounter  Acute cough     Rx / DC Orders   ED Discharge Orders     None        Note:  This document was prepared using Dragon voice recognition software and may include unintentional dictation errors.    Jacolyn Pae, MD 09/04/23 1729
